# Patient Record
Sex: Female | Born: 1980 | Race: White | Hispanic: Yes | Marital: Married | Smoking: Former smoker
Health system: Southern US, Community
[De-identification: ages and names within clinical notes are randomized; demographics above are authoritative.]

## PROBLEM LIST (undated history)

## (undated) ENCOUNTER — Inpatient Hospital Stay (HOSPITAL_COMMUNITY): Payer: Self-pay

## (undated) DIAGNOSIS — S2232XA Fracture of one rib, left side, initial encounter for closed fracture: Secondary | ICD-10-CM

## (undated) DIAGNOSIS — G43909 Migraine, unspecified, not intractable, without status migrainosus: Secondary | ICD-10-CM

## (undated) DIAGNOSIS — A6000 Herpesviral infection of urogenital system, unspecified: Secondary | ICD-10-CM

## (undated) DIAGNOSIS — S62609A Fracture of unspecified phalanx of unspecified finger, initial encounter for closed fracture: Secondary | ICD-10-CM

## (undated) DIAGNOSIS — J45909 Unspecified asthma, uncomplicated: Secondary | ICD-10-CM

## (undated) DIAGNOSIS — E119 Type 2 diabetes mellitus without complications: Secondary | ICD-10-CM

---
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
## 2001-02-28 ENCOUNTER — Inpatient Hospital Stay (HOSPITAL_COMMUNITY): Admission: AD | Admit: 2001-02-28 | Discharge: 2001-02-28 | Payer: Self-pay | Admitting: *Deleted

## 2001-03-01 ENCOUNTER — Inpatient Hospital Stay (HOSPITAL_COMMUNITY): Admission: AD | Admit: 2001-03-01 | Discharge: 2001-03-03 | Payer: Self-pay | Admitting: Obstetrics

## 2001-05-28 ENCOUNTER — Emergency Department (HOSPITAL_COMMUNITY): Admission: EM | Admit: 2001-05-28 | Discharge: 2001-05-28 | Payer: Self-pay | Admitting: Emergency Medicine

## 2002-03-17 ENCOUNTER — Inpatient Hospital Stay (HOSPITAL_COMMUNITY): Admission: AD | Admit: 2002-03-17 | Discharge: 2002-03-17 | Payer: Self-pay | Admitting: *Deleted

## 2002-03-24 ENCOUNTER — Encounter: Payer: Self-pay | Admitting: *Deleted

## 2002-03-24 ENCOUNTER — Encounter (HOSPITAL_COMMUNITY): Admission: RE | Admit: 2002-03-24 | Discharge: 2002-04-23 | Payer: Self-pay | Admitting: *Deleted

## 2002-04-14 ENCOUNTER — Encounter: Admission: RE | Admit: 2002-04-14 | Discharge: 2002-04-14 | Payer: Self-pay | Admitting: *Deleted

## 2002-04-28 ENCOUNTER — Encounter (HOSPITAL_COMMUNITY): Admission: RE | Admit: 2002-04-28 | Discharge: 2002-05-09 | Payer: Self-pay | Admitting: *Deleted

## 2002-05-06 ENCOUNTER — Inpatient Hospital Stay (HOSPITAL_COMMUNITY): Admission: AD | Admit: 2002-05-06 | Discharge: 2002-05-09 | Payer: Self-pay | Admitting: Family Medicine

## 2002-06-13 ENCOUNTER — Encounter: Payer: Self-pay | Admitting: *Deleted

## 2002-06-13 ENCOUNTER — Emergency Department (HOSPITAL_COMMUNITY): Admission: EM | Admit: 2002-06-13 | Discharge: 2002-06-13 | Payer: Self-pay | Admitting: Emergency Medicine

## 2006-10-04 ENCOUNTER — Inpatient Hospital Stay (HOSPITAL_COMMUNITY): Admission: AD | Admit: 2006-10-04 | Discharge: 2006-10-04 | Payer: Self-pay | Admitting: Family Medicine

## 2006-11-22 ENCOUNTER — Inpatient Hospital Stay (HOSPITAL_COMMUNITY): Admission: AD | Admit: 2006-11-22 | Discharge: 2006-11-22 | Payer: Self-pay | Admitting: Obstetrics & Gynecology

## 2006-12-31 ENCOUNTER — Ambulatory Visit: Payer: Self-pay | Admitting: Advanced Practice Midwife

## 2006-12-31 ENCOUNTER — Inpatient Hospital Stay (HOSPITAL_COMMUNITY): Admission: AD | Admit: 2006-12-31 | Discharge: 2007-01-01 | Payer: Self-pay | Admitting: Gynecology

## 2007-01-28 ENCOUNTER — Ambulatory Visit (HOSPITAL_COMMUNITY): Admission: RE | Admit: 2007-01-28 | Discharge: 2007-01-28 | Payer: Self-pay | Admitting: Family Medicine

## 2007-04-12 ENCOUNTER — Ambulatory Visit: Payer: Self-pay | Admitting: Obstetrics and Gynecology

## 2007-04-12 ENCOUNTER — Inpatient Hospital Stay (HOSPITAL_COMMUNITY): Admission: AD | Admit: 2007-04-12 | Discharge: 2007-04-14 | Payer: Self-pay | Admitting: Obstetrics and Gynecology

## 2008-02-22 ENCOUNTER — Emergency Department (HOSPITAL_COMMUNITY): Admission: EM | Admit: 2008-02-22 | Discharge: 2008-02-22 | Payer: Self-pay | Admitting: Emergency Medicine

## 2009-05-26 ENCOUNTER — Encounter: Admission: RE | Admit: 2009-05-26 | Discharge: 2009-05-26 | Payer: Self-pay | Admitting: Internal Medicine

## 2009-06-21 ENCOUNTER — Emergency Department (HOSPITAL_COMMUNITY): Admission: EM | Admit: 2009-06-21 | Discharge: 2009-06-21 | Payer: Self-pay | Admitting: Emergency Medicine

## 2009-11-30 ENCOUNTER — Emergency Department (HOSPITAL_COMMUNITY): Admission: EM | Admit: 2009-11-30 | Discharge: 2009-11-30 | Payer: Self-pay | Admitting: Emergency Medicine

## 2010-04-08 ENCOUNTER — Emergency Department (HOSPITAL_COMMUNITY): Admission: EM | Admit: 2010-04-08 | Discharge: 2010-04-08 | Payer: Self-pay | Admitting: Emergency Medicine

## 2010-06-12 ENCOUNTER — Emergency Department (HOSPITAL_COMMUNITY)
Admission: EM | Admit: 2010-06-12 | Discharge: 2010-06-12 | Payer: Self-pay | Source: Home / Self Care | Admitting: Emergency Medicine

## 2010-09-04 LAB — RAPID STREP SCREEN (MED CTR MEBANE ONLY): Streptococcus, Group A Screen (Direct): POSITIVE — AB

## 2010-09-11 LAB — RAPID STREP SCREEN (MED CTR MEBANE ONLY): Streptococcus, Group A Screen (Direct): NEGATIVE

## 2011-04-04 LAB — CBC
MCHC: 33.1
Platelets: 293
RDW: 16.7 — ABNORMAL HIGH
WBC: 21.1 — ABNORMAL HIGH

## 2011-04-04 LAB — RPR: RPR Ser Ql: NONREACTIVE

## 2011-04-04 LAB — CCBB MATERNAL DONOR DRAW

## 2011-04-06 ENCOUNTER — Emergency Department (HOSPITAL_COMMUNITY)
Admission: EM | Admit: 2011-04-06 | Discharge: 2011-04-06 | Disposition: A | Discharge status: Home / Self Care | Payer: 59 | Attending: Emergency Medicine | Admitting: Emergency Medicine

## 2011-04-06 ENCOUNTER — Emergency Department (HOSPITAL_COMMUNITY): Payer: 59

## 2011-04-06 DIAGNOSIS — R071 Chest pain on breathing: Secondary | ICD-10-CM | POA: Insufficient documentation

## 2011-04-06 DIAGNOSIS — R05 Cough: Secondary | ICD-10-CM | POA: Insufficient documentation

## 2011-04-06 DIAGNOSIS — J45909 Unspecified asthma, uncomplicated: Secondary | ICD-10-CM | POA: Insufficient documentation

## 2011-04-06 DIAGNOSIS — R059 Cough, unspecified: Secondary | ICD-10-CM | POA: Insufficient documentation

## 2011-04-06 DIAGNOSIS — R55 Syncope and collapse: Secondary | ICD-10-CM | POA: Insufficient documentation

## 2011-04-06 LAB — POCT I-STAT, CHEM 8
Glucose, Bld: 141 mg/dL — ABNORMAL HIGH (ref 70–99)
HCT: 37 % (ref 36.0–46.0)
Hemoglobin: 12.6 g/dL (ref 12.0–15.0)
Potassium: 3.8 mEq/L (ref 3.5–5.1)
Sodium: 138 mEq/L (ref 135–145)

## 2011-04-06 LAB — POCT PREGNANCY, URINE: Preg Test, Ur: NEGATIVE

## 2011-04-10 LAB — WET PREP, GENITAL
Clue Cells Wet Prep HPF POC: NONE SEEN
Trich, Wet Prep: NONE SEEN

## 2011-04-10 LAB — URINE MICROSCOPIC-ADD ON

## 2011-04-10 LAB — URINALYSIS, ROUTINE W REFLEX MICROSCOPIC
Bilirubin Urine: NEGATIVE
Hgb urine dipstick: NEGATIVE
Specific Gravity, Urine: >1.03 — ABNORMAL HIGH
Urobilinogen, UA: 0.2

## 2012-09-24 ENCOUNTER — Encounter: Payer: 59 | Admitting: Medical

## 2012-11-18 ENCOUNTER — Encounter (HOSPITAL_COMMUNITY): Payer: Self-pay | Admitting: Emergency Medicine

## 2012-11-18 ENCOUNTER — Emergency Department (HOSPITAL_COMMUNITY)
Admission: EM | Admit: 2012-11-18 | Discharge: 2012-11-19 | Disposition: A | Discharge status: Home / Self Care | Payer: Self-pay | Attending: Emergency Medicine | Admitting: Emergency Medicine

## 2012-11-18 ENCOUNTER — Emergency Department (HOSPITAL_COMMUNITY): Payer: Self-pay

## 2012-11-18 DIAGNOSIS — Z3490 Encounter for supervision of normal pregnancy, unspecified, unspecified trimester: Secondary | ICD-10-CM

## 2012-11-18 DIAGNOSIS — J45909 Unspecified asthma, uncomplicated: Secondary | ICD-10-CM | POA: Insufficient documentation

## 2012-11-18 DIAGNOSIS — R1031 Right lower quadrant pain: Secondary | ICD-10-CM | POA: Insufficient documentation

## 2012-11-18 DIAGNOSIS — R109 Unspecified abdominal pain: Secondary | ICD-10-CM

## 2012-11-18 DIAGNOSIS — O9989 Other specified diseases and conditions complicating pregnancy, childbirth and the puerperium: Secondary | ICD-10-CM | POA: Insufficient documentation

## 2012-11-18 HISTORY — DX: Unspecified asthma, uncomplicated: J45.909

## 2012-11-18 LAB — URINALYSIS, ROUTINE W REFLEX MICROSCOPIC
Bilirubin Urine: NEGATIVE
Hgb urine dipstick: NEGATIVE
Protein, ur: NEGATIVE mg/dL
Urobilinogen, UA: 0.2 mg/dL (ref 0.0–1.0)

## 2012-11-18 LAB — COMPREHENSIVE METABOLIC PANEL
ALT: 16 U/L (ref 0–35)
AST: 12 U/L (ref 0–37)
Alkaline Phosphatase: 95 U/L (ref 39–117)
CO2: 25 mEq/L (ref 19–32)
Chloride: 101 mEq/L (ref 96–112)
GFR calc non Af Amer: >90 mL/min (ref >90)
Potassium: 4.1 mEq/L (ref 3.5–5.1)
Sodium: 135 mEq/L (ref 135–145)
Total Bilirubin: 0.1 mg/dL — ABNORMAL LOW (ref 0.3–1.2)

## 2012-11-18 LAB — CBC WITH DIFFERENTIAL/PLATELET
Basophils Absolute: 0 10*3/uL (ref 0.0–0.1)
HCT: 33.5 % — ABNORMAL LOW (ref 36.0–46.0)
Lymphocytes Relative: 26 % (ref 12–46)
Monocytes Absolute: 0.6 10*3/uL (ref 0.1–1.0)
Neutro Abs: 7.9 10*3/uL — ABNORMAL HIGH (ref 1.7–7.7)
Platelets: 277 10*3/uL (ref 150–400)
RDW: 14.6 % (ref 11.5–15.5)
WBC: 11.7 10*3/uL — ABNORMAL HIGH (ref 4.0–10.5)

## 2012-11-18 MED ORDER — SODIUM CHLORIDE 0.9 % IV SOLN
INTRAVENOUS | Status: DC
  Administered 2012-11-19: 01:00:00 via INTRAVENOUS

## 2012-11-18 MED ORDER — SODIUM CHLORIDE 0.9 % IV BOLUS (SEPSIS)
500.0000 mL | Freq: Once | INTRAVENOUS | Status: AC
  Administered 2012-11-19: 01:00:00 via INTRAVENOUS

## 2012-11-18 MED ORDER — ONDANSETRON 4 MG PO TBDP
8.0000 mg | ORAL_TABLET | Freq: Once | ORAL | Status: AC
  Administered 2012-11-18: 8 mg via ORAL

## 2012-11-18 MED ORDER — FENTANYL CITRATE 0.05 MG/ML IJ SOLN
100.0000 ug | Freq: Once | INTRAMUSCULAR | Status: AC
  Administered 2012-11-19: 100 ug via INTRAVENOUS
  Filled 2012-11-18: qty 2

## 2012-11-18 MED ORDER — ONDANSETRON 4 MG PO TBDP
ORAL_TABLET | ORAL | Status: AC
  Filled 2012-11-18: qty 2

## 2012-11-18 MED ORDER — OXYCODONE-ACETAMINOPHEN 5-325 MG PO TABS
1.0000 | ORAL_TABLET | Freq: Once | ORAL | Status: AC
  Administered 2012-11-18: 1 via ORAL
  Filled 2012-11-18: qty 1

## 2012-11-18 NOTE — ED Provider Notes (Signed)
History     CSN: [id number]  Arrival date & time 11/18/12  1842   First MD Initiated Contact with Patient 11/18/12 2305      Chief Complaint  Patient presents with  . Flank Pain    (Consider location/radiation/quality/duration/timing/severity/associated sxs/prior treatment) HPI Comments: Wanda Harris is a 32 y.o. Female  who presents for evaluation of right lower abdomen pain. The pain started about 20 hours ago and is persistent. The pain radiates to the right flank. The pain was gradual in onset. The pain is dull. The pain is worse with deep breathing. Denies fever, or chills. She has nausea without vomiting. That has been present for several days. Last menstrual period was 4/13/4 evening. She has a positive home pregnancy test. She is gravida 4 para 3. She denies vaginal bleeding or discharge. She's never had this previously. There's been no associated dysuria or urinary frequency, fever, chills, cough, shortness of breath, or chest pain. There are no other known modifying factors.  Patient is a 32 y.o. female presenting with flank pain. The history is provided by the patient.  Flank Pain    Past Medical History  Diagnosis Date  . Asthma     does not use inhaler    History reviewed. No pertinent past surgical history.  History reviewed. No pertinent family history.  History  Substance Use Topics  . Smoking status: Never Smoker   . Smokeless tobacco: Not on file  . Alcohol Use: No    OB History   Grav Para Term Preterm Abortions TAB SAB Ect Mult Living   1               Review of Systems  Genitourinary: Positive for flank pain.  All other systems reviewed and are negative.    Allergies  Review of patient's allergies indicates no known allergies.  Home Medications   Current Outpatient Rx  Name  Route  Sig  Dispense  Refill  . aspirin-acetaminophen-caffeine (PAMPRIN MAX) 250-250-65 MG per tablet   Oral   Take 1 tablet by mouth every 6 (six) hours as needed  for pain.           BP 109/56  Pulse 67  Temp(Src) 98.8 F (37.1 C) (Oral)  Resp 14  Wt 180 lb (81.647 kg)  SpO2 98%  LMP 10/05/2012  Physical Exam  Nursing note and vitals reviewed. Constitutional: She is oriented to person, place, and time. She appears well-developed and well-nourished.  HENT:  Head: Normocephalic and atraumatic.  Eyes: Conjunctivae and EOM are normal. Pupils are equal, round, and reactive to light.  Neck: Normal range of motion and phonation normal. Neck supple.  Cardiovascular: Normal rate, regular rhythm and intact distal pulses.   Pulmonary/Chest: Effort normal and breath sounds normal. She exhibits no tenderness.  Abdominal: Soft. She exhibits no distension and no mass. There is tenderness (Moderate right lower quadrant tenderness). There is no rebound and no guarding.  Genitourinary:  Unable to perform percussive tenderness. Testing of the right flank secondary to right flank tenderness to light palpation.   Normal external female genitalia. Small amount of cleaning vaginal discharge. Cervix appears normal. External os is open to fingertip. There is no blood in the vagina. On bimanual examination the uterus is not enlarged. There is bilateral adnexal tenderness, without palpable mass or enlarged ovary.  Musculoskeletal: Normal range of motion.  Tender to palpation right flank and right low back  Neurological: She is alert and oriented to person, place, and  time. She has normal strength. She exhibits normal muscle tone.  Skin: Skin is warm and dry.  Psychiatric: She has a normal mood and affect. Her behavior is normal. Judgment and thought content normal.    ED Course  Procedures (including critical care time)  Medications  0.9 %  sodium chloride infusion ( Intravenous Stopped 11/19/12 0230)  ondansetron (ZOFRAN-ODT) disintegrating tablet 8 mg (8 mg Oral Given 11/18/12 1912)  oxyCODONE-acetaminophen (PERCOCET/ROXICET) 5-325 MG per tablet 1 tablet (1  tablet Oral Given 11/18/12 1911)  sodium chloride 0.9 % bolus 500 mL (0 mLs Intravenous Stopped 11/19/12 0230)  fentaNYL (SUBLIMAZE) injection 100 mcg (100 mcg Intravenous Given 11/19/12 0100)    Patient Vitals for the past 24 hrs:  BP Temp Temp src Pulse Resp SpO2 Weight  11/19/12 0241 109/56 mmHg - - - - - -  11/19/12 0211 92/59 mmHg - - 67 14 98 % -  11/18/12 2049 106/49 mmHg 98.8 F (37.1 C) Oral 64 14 99 % -  11/18/12 1857 133/62 mmHg 98.2 F (36.8 C) Oral 71 - 98 % 180 lb (81.647 kg)   1:09 AM Reevaluation with update and discussion. After initial assessment and treatment, an updated evaluation reveals she continues to have crampy mid abdominal pain. Krissia Schreier L    3:22 AM Reevaluation with update and discussion. After initial assessment and treatment, an updated evaluation reveals Her pain is almost gone. Ambulatory BP normal.. Nerea Bordenave L     Labs Reviewed  WET PREP, GENITAL - Abnormal; Notable for the following:    Yeast Wet Prep HPF POC FEW (*)    Clue Cells Wet Prep HPF POC FEW (*)    WBC, Wet Prep HPF POC TOO NUMEROUS TO COUNT (*)    All other components within normal limits  CBC WITH DIFFERENTIAL - Abnormal; Notable for the following:    WBC 11.7 (*)    Hemoglobin 11.1 (*)    HCT 33.5 (*)    MCV 75.6 (*)    MCH 25.1 (*)    Neutro Abs 7.9 (*)    All other components within normal limits  COMPREHENSIVE METABOLIC PANEL - Abnormal; Notable for the following:    Glucose, Bld 146 (*)    Total Bilirubin 0.1 (*)    All other components within normal limits  URINALYSIS, ROUTINE W REFLEX MICROSCOPIC - Abnormal; Notable for the following:    Glucose, UA 250 (*)    All other components within normal limits  POCT PREGNANCY, URINE - Abnormal; Notable for the following:    Preg Test, Ur POSITIVE (*)    All other components within normal limits  GC/CHLAMYDIA PROBE AMP   Mr Pelvis Wo Contrast  11/19/2012   *RADIOLOGY REPORT*  Clinical Data:  [redacted] weeks pregnant.  Right  flank pain and right lower quadrant pain.  MRI ABDOMEN AND PELVIS WITHOUT CONTRAST  Technique:  Multiplanar multisequence MR imaging of the abdomen and pelvis was performed.  No intravenous contrast was administered.  Comparison:   None.  MRI ABDOMEN  Findings:  The gallbladder is normal.  No evidence of hydronephrosis.  No focal hepatic lesion.  The spleen, stomach and proximal small bowel are normal.  IMPRESSION: No evidence of cholecystitis or hydronephrosis.  MRI PELVIS  Findings: The appendix is partially imaged medial to the cecum (image 32, series 3).  There is no associated inflammation.  No evidence of hydroureter on the left or right.  The ovaries are normal.  Gravid uterus is noted.  Bladder is  normal.  There is trace amount fluid adjacent the right adnexa.  No significant free fluid in the cul-de-sac.  IMPRESSION:  1.  No evidence of acute appendicitis.  The appendix is partially imaged and appears normal.  Recommend close interval follow-up.  2.  No evidence of hydroureter on the left or right.  3.  Ovaries appear normal. Small amount free fluid adjacent to the right adnexa.   4.  Gravid uterus.   Original Report Authenticated By: Genevive Bi, M.D.   Mr Abdomen Wo Contrast  11/19/2012   *RADIOLOGY REPORT*  Clinical Data:  [redacted] weeks pregnant.  Right flank pain and right lower quadrant pain.  MRI ABDOMEN AND PELVIS WITHOUT CONTRAST  Technique:  Multiplanar multisequence MR imaging of the abdomen and pelvis was performed.  No intravenous contrast was administered.  Comparison:   None.  MRI ABDOMEN  Findings:  The gallbladder is normal.  No evidence of hydronephrosis.  No focal hepatic lesion.  The spleen, stomach and proximal small bowel are normal.  IMPRESSION: No evidence of cholecystitis or hydronephrosis.  MRI PELVIS  Findings: The appendix is partially imaged medial to the cecum (image 32, series 3).  There is no associated inflammation.  No evidence of hydroureter on the left or right.  The  ovaries are normal.  Gravid uterus is noted.  Bladder is normal.  There is trace amount fluid adjacent the right adnexa.  No significant free fluid in the cul-de-sac.  IMPRESSION:  1.  No evidence of acute appendicitis.  The appendix is partially imaged and appears normal.  Recommend close interval follow-up.  2.  No evidence of hydroureter on the left or right.  3.  Ovaries appear normal. Small amount free fluid adjacent to the right adnexa.   4.  Gravid uterus.   Original Report Authenticated By: Genevive Bi, M.D.     1. Abdominal pain   2. Pregnancy       MDM  Nonspecific abdominal pain, with reassuring evaluation, including pelvic exam and MR, abdomen. Doubt appendicitis, colitis, ovarian, abnormality or pregnancy complication. Doubt metabolic instability, serious bacterial infection or impending vascular collapse; the patient is stable for discharge.  Nursing Notes Reviewed/ Care Coordinated, and agree without changes. Applicable Imaging Reviewed.  Interpretation of Laboratory Data incorporated into ED treatment   Plan: Home Medications- Norco; Home Treatments- rest, fluids; Recommended follow up- OB followup ASAP and [facility] follow up when necessary       Flint Melter, MD 11/19/12 [phone]

## 2012-11-18 NOTE — ED Notes (Signed)
Right sided flank since yesterday no known cause. Sharp. Radiating from front lower flank to back. Recent POSITIVE pregnancy test. NO urinary Sx. Normal BM. Nausea, no emesis

## 2012-11-18 NOTE — ED Notes (Signed)
NURSE FIRST ROUNDS : NURSE EXPLAINED DELAY , WAIT TIME AND PROCESS , RESTING AT WAITING AREA WITH NO DISTRESS, RESPIRATIONS UNLABORED .

## 2012-11-19 LAB — WET PREP, GENITAL

## 2012-11-19 MED ORDER — HYDROCODONE-ACETAMINOPHEN 5-325 MG PO TABS: 1.0000 | ORAL_TABLET | ORAL | Status: DC | PRN

## 2012-11-19 NOTE — ED Notes (Signed)
Pt. C/o lower abdominal cramping.

## 2012-11-19 NOTE — ED Notes (Signed)
Pt. Verbalized understanding of discharge paperwork. 

## 2012-11-19 NOTE — ED Notes (Signed)
Dr. Effie Shy updated on patient VS

## 2012-11-19 NOTE — ED Notes (Signed)
Physician seen pt prior to this nurse. See physician notes for assessment.

## 2012-12-25 ENCOUNTER — Encounter (HOSPITAL_COMMUNITY): Payer: Self-pay | Admitting: *Deleted

## 2012-12-25 ENCOUNTER — Inpatient Hospital Stay (HOSPITAL_COMMUNITY)
Admission: AD | Admit: 2012-12-25 | Discharge: 2012-12-26 | Disposition: A | Discharge status: Home / Self Care | Payer: Medicaid Other | Source: Ambulatory Visit | Attending: Obstetrics & Gynecology | Admitting: Obstetrics & Gynecology

## 2012-12-25 ENCOUNTER — Inpatient Hospital Stay (HOSPITAL_COMMUNITY): Payer: Medicaid Other

## 2012-12-25 DIAGNOSIS — N930 Postcoital and contact bleeding: Secondary | ICD-10-CM

## 2012-12-25 DIAGNOSIS — B9689 Other specified bacterial agents as the cause of diseases classified elsewhere: Secondary | ICD-10-CM | POA: Insufficient documentation

## 2012-12-25 DIAGNOSIS — O2390 Unspecified genitourinary tract infection in pregnancy, unspecified trimester: Secondary | ICD-10-CM | POA: Insufficient documentation

## 2012-12-25 DIAGNOSIS — O26859 Spotting complicating pregnancy, unspecified trimester: Secondary | ICD-10-CM | POA: Insufficient documentation

## 2012-12-25 DIAGNOSIS — N760 Acute vaginitis: Secondary | ICD-10-CM | POA: Insufficient documentation

## 2012-12-25 DIAGNOSIS — Z3490 Encounter for supervision of normal pregnancy, unspecified, unspecified trimester: Secondary | ICD-10-CM

## 2012-12-25 DIAGNOSIS — A499 Bacterial infection, unspecified: Secondary | ICD-10-CM | POA: Insufficient documentation

## 2012-12-25 LAB — URINALYSIS, ROUTINE W REFLEX MICROSCOPIC
Bilirubin Urine: NEGATIVE
Leukocytes, UA: NEGATIVE
Nitrite: NEGATIVE
Specific Gravity, Urine: 1.015 (ref 1.005–1.030)
Urobilinogen, UA: 0.2 mg/dL (ref 0.0–1.0)

## 2012-12-25 LAB — URINE MICROSCOPIC-ADD ON

## 2012-12-25 NOTE — MAU Provider Note (Signed)
History     CSN: [id number]  Arrival date and time: 12/25/12 2000   First Provider Initiated Contact with Patient 12/25/12 2127      Chief Complaint  Patient presents with  . Vaginal Bleeding  . Abdominal Pain   HPI This is a 32 y.o. female at [redacted]w[redacted]d who presents with c/o small area of spotting after intercourse.  Has had pain over left lower quadrant. Was seen for that pain also in May.  Has not started [facility] yet but has an appt in our clinic in July.   RN Note: Pt reports blood on tissue yesterday and today has a little more bleeding. Pain left lower quadrant since yesterday      OB History   Grav Para Term Preterm Abortions TAB SAB Ect Mult Living   5 3 3  1 1    3       Past Medical History  Diagnosis Date  . Asthma     does not use inhaler    Past Surgical History  Procedure Laterality Date  . No past surgeries      History reviewed. No pertinent family history.  History  Substance Use Topics  . Smoking status: Never Smoker   . Smokeless tobacco: Not on file  . Alcohol Use: No    Allergies: No Known Allergies  No prescriptions prior to admission    Review of Systems  Constitutional: Negative for fever, chills and malaise/fatigue.  Gastrointestinal: Positive for nausea, abdominal pain and constipation. Negative for vomiting and diarrhea.  Genitourinary: Negative for dysuria.  Neurological: Negative for dizziness.   Physical Exam   Blood pressure 107/63, pulse 77, temperature 98.8 F (37.1 C), temperature source Oral, resp. rate 18, height 5' (1.524 m), weight 81.647 kg (180 lb), last menstrual period 10/05/2012, SpO2 100.00%.  Physical Exam  Constitutional: She is oriented to person, place, and time. She appears well-developed and well-nourished. No distress.  HENT:  Head: Normocephalic.  Cardiovascular: Normal rate.   Respiratory: Effort normal.  GI: Soft. She exhibits no distension and no mass. There is tenderness (over lower abdomen to light  touch). There is no rebound and no guarding.  Genitourinary: No vaginal discharge found.  Musculoskeletal: Normal range of motion.  Neurological: She is alert and oriented to person, place, and time.  Skin: Skin is warm and dry.  Psychiatric: She has a normal mood and affect.    MAU Course  Procedures  MDM [place] ordered:  US Ob Comp Less 14 Wks  12/26/2012   *RADIOLOGY REPORT*  Clinical Data: Severe pelvic pain.  Unable to assess fetal heart tones.  OBSTETRIC <14 WK ULTRASOUND  Technique:  Transabdominal ultrasound was performed for evaluation of the gestation as well as the maternal uterus and adnexal regions.  Comparison:  None.  Intrauterine gestational sac: Single Yolk sac: Yolk sac not identified. Embryo: Present. Cardiac Activity: Present. Heart Rate: 167 bpm  CRL:  54.6 mm  12 w  1 d            [place] EDC: 07/08/2013.  Maternal uterus/Adnexae: Physiologic appearance of the ovaries.  Right corpus luteum cyst. No free fluid.  IMPRESSION: Single intrauterine pregnancy without complicating features.   Original Report Authenticated By: Andreas Newport, M.D.    Assessment and Plan  A:  Pregnancy      Mild BV on previous exam, not treated      Postcoital bleeding       Normal IUP  P:  Results reviewed  Discussed mild BV diagnosed on ER visit, will treat now       Keep appt in clinic later July       Since we have good dates now, will sent note to clinic to get her to MFM next week so we can get first trimester NT/testing done before 13 weeks.    [facility] 12/25/2012, 9:31 PM

## 2012-12-25 NOTE — MAU Note (Signed)
Pt reports blood on tissue yesterday and today has a little more bleeding. Pain left lower quadrant since yesterday

## 2012-12-26 DIAGNOSIS — A499 Bacterial infection, unspecified: Secondary | ICD-10-CM

## 2012-12-26 DIAGNOSIS — N760 Acute vaginitis: Secondary | ICD-10-CM

## 2012-12-26 MED ORDER — TRAMADOL HCL 50 MG PO TABS
50.0000 mg | ORAL_TABLET | Freq: Once | ORAL | Status: AC
  Administered 2012-12-26: 50 mg via ORAL
  Filled 2012-12-26: qty 1

## 2012-12-26 MED ORDER — TRAMADOL HCL 50 MG PO TABS: 50.0000 mg | ORAL_TABLET | Freq: Once | ORAL | Status: DC

## 2012-12-29 ENCOUNTER — Telehealth: Payer: Self-pay | Admitting: General Practice

## 2012-12-29 ENCOUNTER — Other Ambulatory Visit: Payer: Self-pay | Admitting: General Practice

## 2012-12-29 DIAGNOSIS — Z3682 Encounter for antenatal screening for nuchal translucency: Secondary | ICD-10-CM

## 2012-12-29 DIAGNOSIS — Z3490 Encounter for supervision of normal pregnancy, unspecified, unspecified trimester: Secondary | ICD-10-CM

## 2012-12-29 NOTE — Telephone Encounter (Signed)
Called patient, no answer and unable to leave message- MFM appt for 7/11 at 11:30, patient needs to be informed

## 2012-12-29 NOTE — Telephone Encounter (Signed)
Message copied by Kathee Delton on Mon Dec 29, 2012  3:55 PM ------      Message from: Aviva Signs      Created: Fri Dec 26, 2012 12:19 AM      Regarding: need to schedule first screen asap       Seen for bleeding in MAU            US showed 12.1weeks on 12/25/12            Wants first screen with MFM (blood and NT)            Need to do early next week before she hits 13 weeks.            Thanks      Hilda Lias ------

## 2012-12-31 NOTE — Telephone Encounter (Signed)
Patient called and left message stating she has an appt with MFM Friday at 11:30 and she wanted to see if theres anything she needs to do for that appointment like coming earlier and if she is able to eat or not and would like a call back.

## 2012-12-31 NOTE — Telephone Encounter (Signed)
Called patient, no answer- left message on VM stating she did not need to prepare for her appointment in any way and to just come in and if she has any other questions or concerns to call us back at the clinics

## 2013-01-02 ENCOUNTER — Ambulatory Visit (HOSPITAL_COMMUNITY)
Admission: RE | Admit: 2013-01-02 | Discharge: 2013-01-02 | Disposition: A | Discharge status: Home / Self Care | Payer: Medicaid Other | Source: Ambulatory Visit | Attending: Obstetrics and Gynecology | Admitting: Obstetrics and Gynecology

## 2013-01-02 ENCOUNTER — Encounter (HOSPITAL_COMMUNITY): Payer: Self-pay

## 2013-01-02 ENCOUNTER — Other Ambulatory Visit: Payer: Self-pay

## 2013-01-02 DIAGNOSIS — O351XX0 Maternal care for (suspected) chromosomal abnormality in fetus, not applicable or unspecified: Secondary | ICD-10-CM | POA: Insufficient documentation

## 2013-01-02 DIAGNOSIS — Z3682 Encounter for antenatal screening for nuchal translucency: Secondary | ICD-10-CM

## 2013-01-02 DIAGNOSIS — Z3689 Encounter for other specified antenatal screening: Secondary | ICD-10-CM | POA: Insufficient documentation

## 2013-01-02 DIAGNOSIS — O3510X0 Maternal care for (suspected) chromosomal abnormality in fetus, unspecified, not applicable or unspecified: Secondary | ICD-10-CM | POA: Insufficient documentation

## 2013-01-13 ENCOUNTER — Ambulatory Visit (INDEPENDENT_AMBULATORY_CARE_PROVIDER_SITE_OTHER): Payer: Medicaid Other

## 2013-01-13 VITALS — BP 106/66 | Temp 97.9°F | Wt 179.2 lb

## 2013-01-13 DIAGNOSIS — Z3480 Encounter for supervision of other normal pregnancy, unspecified trimester: Secondary | ICD-10-CM

## 2013-01-13 DIAGNOSIS — Z3492 Encounter for supervision of normal pregnancy, unspecified, second trimester: Secondary | ICD-10-CM

## 2013-01-13 DIAGNOSIS — N898 Other specified noninflammatory disorders of vagina: Secondary | ICD-10-CM

## 2013-01-13 LAB — POCT URINALYSIS DIP (DEVICE)
Glucose, UA: 500 mg/dL — AB
Hgb urine dipstick: NEGATIVE
Specific Gravity, Urine: 1.015 (ref 1.005–1.030)
Urobilinogen, UA: 0.2 mg/dL (ref 0.0–1.0)
pH: 7 (ref 5.0–8.0)

## 2013-01-13 LAB — OB RESULTS CONSOLE GBS: GBS: POSITIVE

## 2013-01-13 MED ORDER — FLUCONAZOLE 150 MG PO TABS: 150.0000 mg | ORAL_TABLET | Freq: Once | ORAL | Status: DC

## 2013-01-13 MED ORDER — METRONIDAZOLE 500 MG PO TABS: 500.0000 mg | ORAL_TABLET | Freq: Two times a day (BID) | ORAL | Status: DC

## 2013-01-13 NOTE — Progress Notes (Signed)
Pulse- 87  Pressure-lower abd  Vaginal discharge-yellowish with odor Pt c/o headaches Weight gain of 11-20lbs New ob packet given Called Thressa Sheller, CNM and per provider she stated to treat pt for BV and yeast based on her results on 11/19/12 and chart shows pt was not treated.  On pt prenatal appt we will evaluate discharge.  Informed pt of recommendation and pt stated understanding.

## 2013-01-14 LAB — OBSTETRIC PANEL
Eosinophils Absolute: 0.1 10*3/uL (ref 0.0–0.7)
Hemoglobin: 10.5 g/dL — ABNORMAL LOW (ref 12.0–15.0)
Hepatitis B Surface Ag: NEGATIVE
Lymphs Abs: 1.8 10*3/uL (ref 0.7–4.0)
MCH: 24.2 pg — ABNORMAL LOW (ref 26.0–34.0)
Monocytes Relative: 5 % (ref 3–12)
Neutro Abs: 6.9 10*3/uL (ref 1.7–7.7)
Neutrophils Relative %: 75 % (ref 43–77)
RBC: 4.34 MIL/uL (ref 3.87–5.11)
Rh Type: POSITIVE

## 2013-01-15 ENCOUNTER — Encounter: Payer: Self-pay | Admitting: *Deleted

## 2013-01-15 DIAGNOSIS — Z3480 Encounter for supervision of other normal pregnancy, unspecified trimester: Secondary | ICD-10-CM | POA: Insufficient documentation

## 2013-01-15 LAB — HEMOGLOBINOPATHY EVALUATION: Hemoglobin Other: 0 %

## 2013-01-16 ENCOUNTER — Encounter: Payer: Self-pay | Admitting: Obstetrics and Gynecology

## 2013-01-16 ENCOUNTER — Other Ambulatory Visit (HOSPITAL_COMMUNITY)
Admission: RE | Admit: 2013-01-16 | Discharge: 2013-01-16 | Disposition: A | Discharge status: Home / Self Care | Payer: Medicaid Other | Source: Ambulatory Visit | Attending: Obstetrics and Gynecology | Admitting: Obstetrics and Gynecology

## 2013-01-16 ENCOUNTER — Ambulatory Visit (INDEPENDENT_AMBULATORY_CARE_PROVIDER_SITE_OTHER): Payer: Medicaid Other | Admitting: Obstetrics and Gynecology

## 2013-01-16 VITALS — BP 97/64 | Temp 98.1°F | Wt 178.0 lb

## 2013-01-16 DIAGNOSIS — Z3482 Encounter for supervision of other normal pregnancy, second trimester: Secondary | ICD-10-CM

## 2013-01-16 DIAGNOSIS — O24919 Unspecified diabetes mellitus in pregnancy, unspecified trimester: Secondary | ICD-10-CM

## 2013-01-16 DIAGNOSIS — Z01419 Encounter for gynecological examination (general) (routine) without abnormal findings: Secondary | ICD-10-CM | POA: Insufficient documentation

## 2013-01-16 DIAGNOSIS — Z1151 Encounter for screening for human papillomavirus (HPV): Secondary | ICD-10-CM | POA: Insufficient documentation

## 2013-01-16 DIAGNOSIS — O24112 Pre-existing diabetes mellitus, type 2, in pregnancy, second trimester: Secondary | ICD-10-CM

## 2013-01-16 DIAGNOSIS — O209 Hemorrhage in early pregnancy, unspecified: Secondary | ICD-10-CM

## 2013-01-16 DIAGNOSIS — O99810 Abnormal glucose complicating pregnancy: Secondary | ICD-10-CM | POA: Insufficient documentation

## 2013-01-16 LAB — POCT URINALYSIS DIP (DEVICE)
Bilirubin Urine: NEGATIVE
Glucose, UA: 250 mg/dL — AB
Ketones, ur: NEGATIVE mg/dL
Specific Gravity, Urine: >=1.03 (ref 1.005–1.030)

## 2013-01-16 LAB — CULTURE, OB URINE: Colony Count: >=100000

## 2013-01-16 MED ORDER — CONCEPT OB 130-92.4-1 MG PO CAPS: 1.0000 | ORAL_CAPSULE | Freq: Every day | ORAL | Status: DC

## 2013-01-16 NOTE — Progress Notes (Signed)
Pulse 74 Vaginal d/c stated as yellow with odor.

## 2013-01-16 NOTE — Progress Notes (Signed)
   Subjective:    Wanda Harris is a [name] [redacted]w[redacted]d being seen today for her first obstetrical visit.  Her obstetrical history is significant for A1 GDM with P2 only, largest BW 8#5, FH DM (sis, Mo), spotting at 12 wks, son has hypothyoidism. . Patient does intend to breast feed. Pregnancy history fully reviewed. Seen MAU for 12 wk spotting and had normal [place]. Treated for BV.  Patient reports no complaints.  Filed Vitals:   01/16/13 0817  BP: 97/64  Temp: 98.1 F (36.7 C)  Weight: 178 lb (80.74 kg)    HISTORY: OB History   Grav Para Term Preterm Abortions TAB SAB Ect Mult Living   5 3 3  1 1    3      # Outc Date GA Lbr Len/2nd Wgt Sex Del Anes PTL Lv   1 TRM 9/02 [redacted]w[redacted]d  7lb2oz(3.232kg) F SVD EPI  Yes   2 TRM 11/03 [redacted]w[redacted]d  8lb5oz(3.771kg) M SVD EPI  Yes   3 TAB 2007           4 TRM 10/08 [redacted]w[redacted]d  7lb5oz(3.317kg) F SVD EPI  Yes   5 CUR              Past Medical History  Diagnosis Date  . Asthma     does not use inhaler   Past Surgical History  Procedure Laterality Date  . No past surgeries     Family History  Problem Relation Age of Onset  . Diabetes Mother   . Hypertension Mother   . Arthritis Mother   . Hyperlipidemia Father      Exam    Uterus:   15 wk size. DT 150  Pelvic Exam: Done by Dr, Thad Ranger   Perineum: Normal Perineum   Vulva: normal   Vagina:  normal mucosa, normal discharge       Cervix: bleeding with Pap. ectropion       Bony Pelvis: proven to 8#5  System: Breast:  normal appearance, no masses or tenderness   Skin: normal coloration and turgor, no rashes    Neurologic: oriented, normal, grossly non-focal   Extremities: normal strength, tone, and muscle mass   HEENT PERRLA   Mouth/Teeth mucous membranes moist, pharynx normal without lesions and dental hygiene good   Neck supple and no masses   Cardiovascular: regular rate and rhythm, no murmurs or gallops   Respiratory:  appears well, vitals normal, no respiratory distress, acyanotic, normal RR,  ear and throat exam is normal, neck free of mass or lymphadenopathy, chest clear, no wheezing, crepitations, rhonchi, normal symmetric air entry   Abdomen: soft, non-tender; bowel sounds normal; no masses,  no organomegaly   Urinary: urethral meatus normal   POCT gluc 124   Assessment:    Pregnancy: [name] Patient Active Problem List   Diagnosis Date Noted  . Class B gestational diabetes mellitus 01/16/2013  . First trimester bleeding 01/16/2013  . Supervision of other normal pregnancy 01/15/2013  Early 1 hr 205 c/w pregestational DM      Plan:     Initial labs drawn already and reviewed with pt. Will get hgb A1C, CMP, TSH, 24 h urine Schedule fetal echo and opthal consult Prenatal vitamins.Borderline anemia (hgb 10.5)> start vits Problem list reviewed and updated. Genetic Screening discussed Quad Screen: ordered.  Ultrasound discussed; fetal survey: ordered.  Follow up in 1 weeks. 50% of 30 min visit spent on counseling and coordination of care.     Sanda Dejoy 01/16/2013

## 2013-01-16 NOTE — Patient Instructions (Signed)

## 2013-01-19 ENCOUNTER — Ambulatory Visit (INDEPENDENT_AMBULATORY_CARE_PROVIDER_SITE_OTHER): Payer: Medicaid Other | Admitting: Obstetrics & Gynecology

## 2013-01-19 DIAGNOSIS — O24112 Pre-existing diabetes mellitus, type 2, in pregnancy, second trimester: Secondary | ICD-10-CM

## 2013-01-19 DIAGNOSIS — O24919 Unspecified diabetes mellitus in pregnancy, unspecified trimester: Secondary | ICD-10-CM

## 2013-01-19 DIAGNOSIS — Z3482 Encounter for supervision of other normal pregnancy, second trimester: Secondary | ICD-10-CM

## 2013-01-19 DIAGNOSIS — O209 Hemorrhage in early pregnancy, unspecified: Secondary | ICD-10-CM

## 2013-01-19 LAB — HEMOGLOBIN A1C
Hgb A1c MFr Bld: 6.5 % — ABNORMAL HIGH (ref <5.7)
Mean Plasma Glucose: 140 mg/dL — ABNORMAL HIGH (ref <117)

## 2013-01-19 LAB — CREATININE CLEARANCE, URINE, 24 HOUR
Creatinine, 24H Ur: 1038 mg/d (ref 700–1800)
Creatinine, Urine: 69.2 mg/dL

## 2013-01-19 LAB — COMPREHENSIVE METABOLIC PANEL
AST: 12 U/L (ref 0–37)
Alkaline Phosphatase: 64 U/L (ref 39–117)
BUN: 8 mg/dL (ref 6–23)
Glucose, Bld: 128 mg/dL — ABNORMAL HIGH (ref 70–99)
Sodium: 137 mEq/L (ref 135–145)
Total Bilirubin: 0.3 mg/dL (ref 0.3–1.2)
Total Protein: 6.6 g/dL (ref 6.0–8.3)

## 2013-01-19 LAB — PROTEIN, URINE, 24 HOUR
Protein, 24H Urine: 150 mg/d — ABNORMAL HIGH (ref 50–100)
Protein, Urine: 10 mg/dL

## 2013-01-19 NOTE — Progress Notes (Signed)
G4P3 Pt here to see educator only re GDM. She had had GDM with her 2nd pregnancy, however 1st and 3rd pregnancies she states were normal. Pt very familiar with how to use a meter as she had done with her pervious GDM.  Demonstrated how to use the meter. Pt then returned demonstration (teach back) with primary issue with lancet device. Give further instruction.' Pt instructed to log her readings, given target glucose goals and review of meal plan. Thank you, Lenor Coffin, RN, Diabetes CNS [phone])

## 2013-01-19 NOTE — Progress Notes (Deleted)
Diabetes Treatment Program Recommendations  ADA Standards of Care 2012 Diabetes in Pregnancy Target Glucose Ranges:  Fasting: 60 - 90 mg/dL Preprandial: 60 - 161 mg/dL 1 hr postprandial: Less than 140mg /dL (from first bite of meal) 2 hr postprandial: Less than 120 mg/dL (from first bit of meal)  Pt referred to me per Dr Maggie Font regardinga need for medication regimen change and needs. Reviewed her glucose log (beautiful, by the way with all appropriate readings and times). Glucose only slightly elevated in the am.  Recommended to increase her glyburide to 7.5 mg at HS And continue Glyburide  2.5 mg in am .

## 2013-01-20 ENCOUNTER — Telehealth: Payer: Self-pay | Admitting: *Deleted

## 2013-01-20 ENCOUNTER — Encounter: Payer: Self-pay | Admitting: Obstetrics & Gynecology

## 2013-01-20 DIAGNOSIS — B951 Streptococcus, group B, as the cause of diseases classified elsewhere: Secondary | ICD-10-CM | POA: Insufficient documentation

## 2013-01-20 DIAGNOSIS — O2340 Unspecified infection of urinary tract in pregnancy, unspecified trimester: Secondary | ICD-10-CM | POA: Insufficient documentation

## 2013-01-20 MED ORDER — AMPICILLIN 500 MG PO CAPS: 500.0000 mg | ORAL_CAPSULE | Freq: Four times a day (QID) | ORAL | Status: DC

## 2013-01-20 NOTE — Telephone Encounter (Signed)
Called patient and left her a message to call us back.

## 2013-01-20 NOTE — Telephone Encounter (Signed)
Message copied by Mannie Stabile on Tue Jan 20, 2013 10:58 AM ------      Message from: Allie Bossier      Created: Tue Jan 20, 2013  8:28 AM       She has a GBS UTI. She will need a prescription for amp 500 mg po QID for 7 days.      Thanks ------

## 2013-01-22 ENCOUNTER — Telehealth: Payer: Self-pay

## 2013-01-22 NOTE — Telephone Encounter (Signed)
Pt informed of rx

## 2013-01-22 NOTE — Telephone Encounter (Addendum)
Returned pt's call and left message stating that we need to know the name of her meter. We will then send Rx for test strips and lancets. Please call back with this information to the nurse voice mail.   8/1  1030  Pt left message that her meter is the [employer] Nano. I sent the Rx for her refill of strips and lancets and called her to inform her. She voiced understanding.

## 2013-01-22 NOTE — Telephone Encounter (Signed)
Pt called and stated that she needs Rx for test strips sent to her [facility].  Pt stated that we can leave message on VM.

## 2013-01-23 MED ORDER — ACCU-CHEK FASTCLIX LANCETS MISC: 1.0000 | Freq: Four times a day (QID) | Status: DC

## 2013-01-23 MED ORDER — GLUCOSE BLOOD VI STRP: ORAL_STRIP | Status: DC

## 2013-01-26 ENCOUNTER — Encounter: Payer: Self-pay | Admitting: Family Medicine

## 2013-01-26 ENCOUNTER — Ambulatory Visit (INDEPENDENT_AMBULATORY_CARE_PROVIDER_SITE_OTHER): Payer: Medicaid Other | Admitting: Family Medicine

## 2013-01-26 ENCOUNTER — Encounter: Payer: Medicaid Other | Attending: Obstetrics & Gynecology | Admitting: *Deleted

## 2013-01-26 VITALS — BP 92/55 | Temp 98.7°F | Wt 179.9 lb

## 2013-01-26 DIAGNOSIS — O99810 Abnormal glucose complicating pregnancy: Secondary | ICD-10-CM

## 2013-01-26 DIAGNOSIS — O24419 Gestational diabetes mellitus in pregnancy, unspecified control: Secondary | ICD-10-CM

## 2013-01-26 DIAGNOSIS — Z713 Dietary counseling and surveillance: Secondary | ICD-10-CM | POA: Insufficient documentation

## 2013-01-26 LAB — POCT URINALYSIS DIP (DEVICE)
Bilirubin Urine: NEGATIVE
Hgb urine dipstick: NEGATIVE
Nitrite: NEGATIVE
Specific Gravity, Urine: 1.02 (ref 1.005–1.030)
Urobilinogen, UA: 0.2 mg/dL (ref 0.0–1.0)
pH: 6 (ref 5.0–8.0)

## 2013-01-26 MED ORDER — GLYBURIDE 5 MG PO TABS: 5.0000 mg | ORAL_TABLET | Freq: Two times a day (BID) | ORAL | Status: DC

## 2013-01-26 MED ORDER — METFORMIN HCL 500 MG PO TABS: 500.0000 mg | ORAL_TABLET | Freq: Every day | ORAL | Status: DC

## 2013-01-26 NOTE — Progress Notes (Signed)
Pt is a 32 yo [id number] @ [redacted]w[redacted]d with probable class B DM although not sure who is here today for routine f/u.   Sugars are in the 130s, 140s most of the time fasting 130-150 post prandial Pt is not currently following a diabetic diet  No lows Nausea has improved. No ctx, LOF, VB.   O: see flowsheet  A/P:  1) class b DM - fetal echo ordered as is ophtho consult  - will refer back to Diabetes education for more diet counseling and discussion -given elevation in sugars fasting, control is suboptimal - will start glyburide 5mg  BID and metformin 500mg  qhs   2) PTL precautions discussed 3) f/u in 2 weeks given medication changes. Requested pt bring labs.

## 2013-01-26 NOTE — Progress Notes (Signed)
Pulse: 82 Pt complains of headaches.

## 2013-01-26 NOTE — Patient Instructions (Addendum)
Pregnancy - Second Trimester The second trimester of pregnancy (3 to 6 months) is a period of rapid growth for you and your baby. At the end of the sixth month, your baby is about 9 inches long and weighs 1 1/2 pounds. You will begin to feel the baby move between 18 and 20 weeks of the pregnancy. This is called quickening. Weight gain is faster. A clear fluid (colostrum) may leak out of your breasts. You may feel small contractions of the womb (uterus). This is known as false labor or Braxton-Hicks contractions. This is like a practice for labor when the baby is ready to be born. Usually, the problems with morning sickness have usually passed by the end of your first trimester. Some women develop small dark blotches (called cholasma, mask of pregnancy) on their face that usually goes away after the baby is born. Exposure to the sun makes the blotches worse. Acne may also develop in some pregnant women and pregnant women who have acne, may find that it goes away. PRENATAL EXAMS  Blood work may continue to be done during prenatal exams. These tests are done to check on your health and the probable health of your baby. Blood work is used to follow your blood levels (hemoglobin). Anemia (low hemoglobin) is common during pregnancy. Iron and vitamins are given to help prevent this. You will also be checked for diabetes between 24 and 28 weeks of the pregnancy. Some of the previous blood tests may be repeated.  The size of the uterus is measured during each visit. This is to make sure that the baby is continuing to grow properly according to the dates of the pregnancy.  Your blood pressure is checked every prenatal visit. This is to make sure you are not getting toxemia.  Your urine is checked to make sure you do not have an infection, diabetes or protein in the urine.  Your weight is checked often to make sure gains are happening at the suggested rate. This is to ensure that both you and your baby are  growing normally.  Sometimes, an ultrasound is performed to confirm the proper growth and development of the baby. This is a test which bounces harmless sound waves off the baby so your caregiver can more accurately determine due dates. Sometimes, a test is done on the amniotic fluid surrounding the baby. This test is called an amniocentesis. The amniotic fluid is obtained by sticking a needle into the belly (abdomen). This is done to check the chromosomes in instances where there is a concern about possible genetic problems with the baby. It is also sometimes done near the end of pregnancy if an early delivery is required. In this case, it is done to help make sure the baby's lungs are mature enough for the baby to live outside of the womb. CHANGES OCCURING IN THE SECOND TRIMESTER OF PREGNANCY Your body goes through many changes during pregnancy. They vary from person to person. Talk to your caregiver about changes you notice that you are concerned about.  During the second trimester, you will likely have an increase in your appetite. It is normal to have cravings for certain foods. This varies from person to person and pregnancy to pregnancy.  Your lower abdomen will begin to bulge.  You may have to urinate more often because the uterus and baby are pressing on your bladder. It is also common to get more bladder infections during pregnancy. You can help this by drinking lots of fluids   and emptying your bladder before and after intercourse.  You may begin to get stretch marks on your hips, abdomen, and breasts. These are normal changes in the body during pregnancy. There are no exercises or medicines to take that prevent this change.  You may begin to develop swollen and bulging veins (varicose veins) in your legs. Wearing support hose, elevating your feet for 15 minutes, 3 to 4 times a day and limiting salt in your diet helps lessen the problem.  Heartburn may develop as the uterus grows and  pushes up against the stomach. Antacids recommended by your caregiver helps with this problem. Also, eating smaller meals 4 to 5 times a day helps.  Constipation can be treated with a stool softener or adding bulk to your diet. Drinking lots of fluids, and eating vegetables, fruits, and whole grains are helpful.  Exercising is also helpful. If you have been very active up until your pregnancy, most of these activities can be continued during your pregnancy. If you have been less active, it is helpful to start an exercise program such as walking.  Hemorrhoids may develop at the end of the second trimester. Warm sitz baths and hemorrhoid cream recommended by your caregiver helps hemorrhoid problems.  Backaches may develop during this time of your pregnancy. Avoid heavy lifting, wear low heal shoes, and practice good posture to help with backache problems.  Some pregnant women develop tingling and numbness of their hand and fingers because of swelling and tightening of ligaments in the wrist (carpel tunnel syndrome). This goes away after the baby is born.  As your breasts enlarge, you may have to get a bigger bra. Get a comfortable, cotton, support bra. Do not get a nursing bra until the last month of the pregnancy if you will be nursing the baby.  You may get a dark line from your belly button to the pubic area called the linea nigra.  You may develop rosy cheeks because of increase blood flow to the face.  You may develop spider looking lines of the face, neck, arms, and chest. These go away after the baby is born. HOME CARE INSTRUCTIONS   It is extremely important to avoid all smoking, herbs, alcohol, and unprescribed drugs during your pregnancy. These chemicals affect the formation and growth of the baby. Avoid these chemicals throughout the pregnancy to ensure the delivery of a healthy infant.  Most of your home care instructions are the same as suggested for the first trimester of your  pregnancy. Keep your caregiver's appointments. Follow your caregiver's instructions regarding medicine use, exercise, and diet.  During pregnancy, you are providing food for you and your baby. Continue to eat regular, well-balanced meals. Choose foods such as meat, fish, milk and other low fat dairy products, vegetables, fruits, and whole-grain breads and cereals. Your caregiver will tell you of the ideal weight gain.  A physical sexual relationship may be continued up until near the end of pregnancy if there are no other problems. Problems could include early (premature) leaking of amniotic fluid from the membranes, vaginal bleeding, abdominal pain, or other medical or pregnancy problems.  Exercise regularly if there are no restrictions. Check with your caregiver if you are unsure of the safety of some of your exercises. The greatest weight gain will occur in the last 2 trimesters of pregnancy. Exercise will help you:  Control your weight.  Get you in shape for labor and delivery.  Lose weight after you have the baby.  Wear   a good support or jogging bra for breast tenderness during pregnancy. This may help if worn during sleep. Pads or tissues may be used in the bra if you are leaking colostrum.  Do not use hot tubs, steam rooms or saunas throughout the pregnancy.  Wear your seat belt at all times when driving. This protects you and your baby if you are in an accident.  Avoid raw meat, uncooked cheese, cat litter boxes, and soil used by cats. These carry germs that can cause birth defects in the baby.  The second trimester is also a good time to visit your dentist for your dental health if this has not been done yet. Getting your teeth cleaned is okay. Use a soft toothbrush. Brush gently during pregnancy.  It is easier to leak urine during pregnancy. Tightening up and strengthening the pelvic muscles will help with this problem. Practice stopping your urination while you are going to the  bathroom. These are the same muscles you need to strengthen. It is also the muscles you would use as if you were trying to stop from passing gas. You can practice tightening these muscles up 10 times a set and repeating this about 3 times per day. Once you know what muscles to tighten up, do not perform these exercises during urination. It is more likely to contribute to an infection by backing up the urine.  Ask for help if you have financial, counseling, or nutritional needs during pregnancy. Your caregiver will be able to offer counseling for these needs as well as refer you for other special needs.  Your skin may become oily. If so, wash your face with mild soap, use non-greasy moisturizer and oil or cream based makeup. MEDICINES AND DRUG USE IN PREGNANCY  Take prenatal vitamins as directed. The vitamin should contain 1 milligram of folic acid. Keep all vitamins out of reach of children. Only a couple vitamins or tablets containing iron may be fatal to a baby or young child when ingested.  Avoid use of all medicines, including herbs, over-the-counter medicines, not prescribed or suggested by your caregiver. Only take over-the-counter or prescription medicines for pain, discomfort, or fever as directed by your caregiver. Do not use aspirin.  Let your caregiver also know about herbs you may be using.  Alcohol is related to a number of birth defects. This includes fetal alcohol syndrome. All alcohol, in any form, should be avoided completely. Smoking will cause low birth rate and premature babies.  Street or illegal drugs are very harmful to the baby. They are absolutely forbidden. A baby born to an addicted mother will be addicted at birth. The baby will go through the same withdrawal an adult does. SEEK MEDICAL CARE IF:  You have any concerns or worries during your pregnancy. It is better to call with your questions if you feel they cannot wait, rather than worry about them. SEEK IMMEDIATE  MEDICAL CARE IF:   An unexplained oral temperature above 102 F (38.9 C) develops, or as your caregiver suggests.  You have leaking of fluid from the vagina (birth canal). If leaking membranes are suspected, take your temperature and tell your caregiver of this when you call.  There is vaginal spotting, bleeding, or passing clots. Tell your caregiver of the amount and how many pads are used. Light spotting in pregnancy is common, especially following intercourse.  You develop a bad smelling vaginal discharge with a change in the color from clear to white.  You continue to feel   sick to your stomach (nauseated) and have no relief from remedies suggested. You vomit blood or coffee ground-like materials.  You lose more than 2 pounds of weight or gain more than 2 pounds of weight over 1 week, or as suggested by your caregiver.  You notice swelling of your face, hands, feet, or legs.  You get exposed to German measles and have never had them.  You are exposed to fifth disease or chickenpox.  You develop belly (abdominal) pain. Round ligament discomfort is a common non-cancerous (benign) cause of abdominal pain in pregnancy. Your caregiver still must evaluate you.  You develop a bad headache that does not go away.  You develop fever, diarrhea, pain with urination, or shortness of breath.  You develop visual problems, blurry, or double vision.  You fall or are in a car accident or any kind of trauma.  There is mental or physical violence at home. Document Released: 06/05/2001 Document Revised: 03/05/2012 Document Reviewed: 12/08/2008 [employer] Patient Information 2014 [employer].  

## 2013-01-26 NOTE — Progress Notes (Signed)
Reviewed CBG log book. Consult requested by Dr. Reola Calkins to recommend initiation of medication. Consult recommend Glyberide 5mg  AM and HS. Metformin 500 mg qd.

## 2013-02-13 ENCOUNTER — Ambulatory Visit (HOSPITAL_COMMUNITY)
Admission: RE | Admit: 2013-02-13 | Discharge: 2013-02-13 | Disposition: A | Discharge status: Home / Self Care | Payer: Medicaid Other | Source: Ambulatory Visit | Attending: Obstetrics and Gynecology | Admitting: Obstetrics and Gynecology

## 2013-02-13 DIAGNOSIS — O99810 Abnormal glucose complicating pregnancy: Secondary | ICD-10-CM | POA: Insufficient documentation

## 2013-02-13 DIAGNOSIS — O209 Hemorrhage in early pregnancy, unspecified: Secondary | ICD-10-CM

## 2013-02-13 DIAGNOSIS — Z3689 Encounter for other specified antenatal screening: Secondary | ICD-10-CM | POA: Insufficient documentation

## 2013-02-13 DIAGNOSIS — Z3482 Encounter for supervision of other normal pregnancy, second trimester: Secondary | ICD-10-CM

## 2013-02-13 DIAGNOSIS — O24112 Pre-existing diabetes mellitus, type 2, in pregnancy, second trimester: Secondary | ICD-10-CM

## 2013-02-13 DIAGNOSIS — B951 Streptococcus, group B, as the cause of diseases classified elsewhere: Secondary | ICD-10-CM

## 2013-02-16 ENCOUNTER — Encounter: Payer: Medicaid Other | Attending: Obstetrics & Gynecology | Admitting: *Deleted

## 2013-02-16 ENCOUNTER — Ambulatory Visit (INDEPENDENT_AMBULATORY_CARE_PROVIDER_SITE_OTHER): Payer: Medicaid Other | Admitting: Obstetrics and Gynecology

## 2013-02-16 ENCOUNTER — Encounter: Payer: Self-pay | Admitting: Obstetrics and Gynecology

## 2013-02-16 VITALS — BP 88/53 | Temp 98.2°F | Wt 180.6 lb

## 2013-02-16 DIAGNOSIS — E119 Type 2 diabetes mellitus without complications: Secondary | ICD-10-CM

## 2013-02-16 DIAGNOSIS — Z713 Dietary counseling and surveillance: Secondary | ICD-10-CM | POA: Insufficient documentation

## 2013-02-16 DIAGNOSIS — O99810 Abnormal glucose complicating pregnancy: Secondary | ICD-10-CM | POA: Insufficient documentation

## 2013-02-16 DIAGNOSIS — O24919 Unspecified diabetes mellitus in pregnancy, unspecified trimester: Secondary | ICD-10-CM

## 2013-02-16 LAB — POCT URINALYSIS DIP (DEVICE)
Bilirubin Urine: NEGATIVE
Hgb urine dipstick: NEGATIVE
Ketones, ur: NEGATIVE mg/dL
Protein, ur: NEGATIVE mg/dL
pH: 5.5 (ref 5.0–8.0)

## 2013-02-16 NOTE — Progress Notes (Signed)
On glyburide and added  Metformin 3 days ago. Last 2 days CBGs better:F 101, 91,93; 2 hr pcb139,126,102; pcl [id number]; pcd131,141,129,106. See Dewayne Hatch. [place] reviewed> all normal; will schedule f/u anatomy to visualize spine. RLP pain.

## 2013-02-16 NOTE — Progress Notes (Signed)
Pulse- 76 Patient reports lower abdominal pain yesterday

## 2013-02-16 NOTE — Patient Instructions (Addendum)
Diabetes Meal Planning Guide The diabetes meal planning guide is a tool to help you plan your meals and snacks. It is important for people with diabetes to manage their blood glucose (sugar) levels. Choosing the right foods and the right amounts throughout your day will help control your blood glucose. Eating right can even help you improve your blood pressure and reach or maintain a healthy weight. CARBOHYDRATE COUNTING MADE EASY When you eat carbohydrates, they turn to sugar. This raises your blood glucose level. Counting carbohydrates can help you control this level so you feel better. When you plan your meals by counting carbohydrates, you can have more flexibility in what you eat and balance your medicine with your food intake. Carbohydrate counting simply means adding up the total amount of carbohydrate grams in your meals and snacks. Try to eat about the same amount at each meal. Foods with carbohydrates are listed below. Each portion below is 1 carbohydrate serving or 15 grams of carbohydrates. Ask your dietician how many grams of carbohydrates you should eat at each meal or snack. Grains and Starches  1 slice bread.   English muffin or hotdog/hamburger bun.   cup cold cereal (unsweetened).   cup cooked pasta or rice.   cup starchy vegetables (corn, potatoes, peas, beans, winter squash).  1 tortilla (6 inches).   bagel.  1 waffle or pancake (size of a CD).   cup cooked cereal.  4 to 6 small crackers. *Whole grain is recommended. Fruit  1 cup fresh unsweetened berries, melon, papaya, pineapple.  1 small fresh fruit.   banana or mango.   cup fruit juice (4 oz unsweetened).   cup canned fruit in natural juice or water.  2 tbs dried fruit.  12 to 15 grapes or cherries. Milk and Yogurt  1 cup fat-free or 1% milk.  1 cup soy milk.  6 oz light yogurt with sugar-free sweetener.  6 oz low-fat soy yogurt.  6 oz plain yogurt. Vegetables  1 cup raw or  cup  cooked is counted as 0 carbohydrates or a "free" food.  If you eat 3 or more servings at 1 meal, count them as 1 carbohydrate serving. Other Carbohydrates   oz chips or pretzels.   cup ice cream or frozen yogurt.   cup sherbet or sorbet.  2 inch square cake, no frosting.  1 tbs honey, sugar, jam, jelly, or syrup.  2 small cookies.  3 squares of graham crackers.  3 cups popcorn.  6 crackers.  1 cup broth-based soup.  Count 1 cup casserole or other mixed foods as 2 carbohydrate servings.  Foods with less than 20 calories in a serving may be counted as 0 carbohydrates or a "free" food. You may want to purchase a book or computer software that lists the carbohydrate gram counts of different foods. In addition, the nutrition facts panel on the labels of the foods you eat are a good source of this information. The label will tell you how big the serving size is and the total number of carbohydrate grams you will be eating per serving. Divide this number by 15 to obtain the number of carbohydrate servings in a portion. Remember, 1 carbohydrate serving equals 15 grams of carbohydrate. SERVING SIZES Measuring foods and serving sizes helps you make sure you are getting the right amount of food. The list below tells how big or small some common serving sizes are.  1 oz.........4 stacked dice.  3 oz.........Deck of cards.  1 tsp........Tip   of little finger.  1 tbs......Marland KitchenMarland KitchenThumb.  2 tbs.......Marland KitchenGolf ball.   cup......Marland KitchenHalf of a fist.  1 cup.......Marland KitchenA fist. SAMPLE DIABETES MEAL PLAN Below is a sample meal plan that includes foods from the grain and starches, dairy, vegetable, fruit, and meat groups. A dietician can individualize a meal plan to fit your calorie needs and tell you the number of servings needed from each food group. However, controlling the total amount of carbohydrates in your meal or snack is more important than making sure you include all of the food groups at every  meal. You may interchange carbohydrate containing foods (dairy, starches, and fruits). The meal plan below is an example of a 2000 calorie diet using carbohydrate counting. This meal plan has 17 carbohydrate servings. Breakfast  1 cup oatmeal (2 carb servings).   cup light yogurt (1 carb serving).  1 cup blueberries (1 carb serving).   cup almonds. Snack  1 large apple (2 carb servings).  1 low-fat string cheese stick. Lunch  Chicken breast salad.  1 cup spinach.   cup chopped tomatoes.  2 oz chicken breast, sliced.  2 tbs low-fat [place] dressing.  12 whole-wheat crackers (2 carb servings).  12 to 15 grapes (1 carb serving).  1 cup low-fat milk (1 carb serving). Snack  1 cup carrots.   cup hummus (1 carb serving). Dinner  3 oz broiled salmon.  1 cup brown rice (3 carb servings). Snack  1  cups steamed broccoli (1 carb serving) drizzled with 1 tsp olive oil and lemon juice.  1 cup light pudding (2 carb servings). DIABETES MEAL PLANNING WORKSHEET Your dietician can use this worksheet to help you decide how many servings of foods and what types of foods are right for you.  BREAKFAST Food Group and Servings / Carb Servings Grain/Starches __________________________________ Dairy __________________________________________ Vegetable ______________________________________ Fruit ___________________________________________ Meat __________________________________________ Fat ____________________________________________ LUNCH Food Group and Servings / Carb Servings Grain/Starches ___________________________________ Dairy ___________________________________________ Fruit ____________________________________________ Meat ___________________________________________ Fat _____________________________________________ La[place] Food Group and Servings / Carb Servings Grain/Starches ___________________________________ Dairy  ___________________________________________ Fruit ____________________________________________ Meat ___________________________________________ Fat _____________________________________________ SNACKS Food Group and Servings / Carb Servings Grain/Starches ___________________________________ Dairy ___________________________________________ Vegetable _______________________________________ Fruit ____________________________________________ Meat ___________________________________________ Fat _____________________________________________ DAILY TOTALS Starches _________________________ Vegetable ________________________ Fruit ____________________________ Dairy ____________________________ Meat ____________________________ Fat ______________________________ Document Released: 03/08/2005 Document Revised: 09/03/2011 Document Reviewed: 01/17/2009 [employer] Patient Information 2014 [employer]. Round Ligament Pain The round ligament is made up of muscle and fibrous tissue. It is attached to the uterus near the fallopian tube. The round ligament is located on both sides of the uterus and helps support the position of the uterus. It usually begins in the second trimester of pregnancy when the uterus comes out of the pelvis. The pain can come and go until the baby is delivered. Round ligament pain is not a serious problem and does not cause harm to the baby. CAUSE During pregnancy the uterus grows the most from the second trimester to delivery. As it grows, it stretches and slightly twists the round ligaments. When the uterus leans from one side to the other, the round ligament on the opposite side pulls and stretches. This can cause pain. SYMPTOMS  Pain can occur on one side or both sides. The pain is usually a short, sharp, and pinching-like. Sometimes it can be a dull, lingering and aching pain. The pain is located in the lower side of the abdomen or in the groin. The pain is internal and  usually starts deep in the groin  and moves up to the outside of the hip area. Pain can occur with:  Sudden change in position like getting out of bed or a chair.  Rolling over in bed.  Coughing or sneezing.  Walking too much.  Any type of physical activity. DIAGNOSIS  Your caregiver will make sure there are no serious problems causing the pain. When nothing serious is found, the symptoms usually indicate that the pain is from the round ligament. TREATMENT   Sit down and relax when the pain starts.  Flex your knees up to your belly.  Lay on your side with a pillow under your belly (abdomen) and another one between your legs.  Sit in a hot bath for 15 to 20 minutes or until the pain goes away. HOME CARE INSTRUCTIONS   Only take over-the-counter or prescriptions medicines for pain, discomfort or fever as directed by your caregiver.  Sit and stand slowly.  Avoid long walks if it causes pain.  Stop or lessen your physical activities if it causes pain. SEEK MEDICAL CARE IF:   The pain does not go away with any of your treatment.  You need stronger medication for the pain.  You develop back pain that you did not have before with the side pain. SEEK IMMEDIATE MEDICAL CARE IF:   You develop a temperature of 102 F (38.9 C) or higher.  You develop uterine contractions.  You develop vaginal bleeding.  You develop nausea, vomiting or diarrhea.  You develop chills.  You have pain when you urinate. Document Released: 03/20/2008 Document Revised: 09/03/2011 Document Reviewed: 03/20/2008 [facility] Patient Information 2014 [place], [place].

## 2013-03-02 ENCOUNTER — Encounter: Payer: Medicaid Other | Admitting: Obstetrics & Gynecology

## 2013-03-30 ENCOUNTER — Ambulatory Visit (INDEPENDENT_AMBULATORY_CARE_PROVIDER_SITE_OTHER): Payer: Medicaid Other | Admitting: Obstetrics and Gynecology

## 2013-03-30 ENCOUNTER — Encounter: Payer: Self-pay | Admitting: Obstetrics and Gynecology

## 2013-03-30 VITALS — BP 102/64 | Temp 98.1°F | Wt 181.3 lb

## 2013-03-30 DIAGNOSIS — Z3482 Encounter for supervision of other normal pregnancy, second trimester: Secondary | ICD-10-CM

## 2013-03-30 DIAGNOSIS — Z23 Encounter for immunization: Secondary | ICD-10-CM

## 2013-03-30 DIAGNOSIS — B951 Streptococcus, group B, as the cause of diseases classified elsewhere: Secondary | ICD-10-CM

## 2013-03-30 DIAGNOSIS — O99810 Abnormal glucose complicating pregnancy: Secondary | ICD-10-CM

## 2013-03-30 DIAGNOSIS — O2390 Unspecified genitourinary tract infection in pregnancy, unspecified trimester: Secondary | ICD-10-CM

## 2013-03-30 LAB — POCT URINALYSIS DIP (DEVICE)
Ketones, ur: NEGATIVE mg/dL
Protein, ur: NEGATIVE mg/dL
Specific Gravity, Urine: 1.015 (ref 1.005–1.030)
Urobilinogen, UA: 0.2 mg/dL (ref 0.0–1.0)
pH: 5.5 (ref 5.0–8.0)

## 2013-03-30 NOTE — Addendum Note (Signed)
Addended by: Sherre Lain A on: 03/30/2013 11:54 AM   Modules accepted: Orders

## 2013-03-30 NOTE — Progress Notes (Signed)
Pulse- 77 Patient reports lower abdominal/pelvic pressure; also reports episode of contractions last week

## 2013-03-30 NOTE — Progress Notes (Signed)
U/S scheduled 04/01/13 at 245 pm.

## 2013-03-30 NOTE — Progress Notes (Signed)
Patient doing well. CBGs all within range. Patient reports some constipation- advised to increase fiber intake, may use over the counter remedies. FM/PTL precautions reviewed. F/U anatomy US ordered

## 2013-04-01 ENCOUNTER — Encounter: Payer: Self-pay | Admitting: Obstetrics and Gynecology

## 2013-04-01 ENCOUNTER — Ambulatory Visit (HOSPITAL_COMMUNITY)
Admission: RE | Admit: 2013-04-01 | Discharge: 2013-04-01 | Disposition: A | Discharge status: Home / Self Care | Payer: Medicaid Other | Source: Ambulatory Visit | Attending: Obstetrics and Gynecology | Admitting: Obstetrics and Gynecology

## 2013-04-01 ENCOUNTER — Ambulatory Visit (HOSPITAL_COMMUNITY): Admission: RE | Admit: 2013-04-01 | Payer: Medicaid Other | Source: Ambulatory Visit

## 2013-04-01 DIAGNOSIS — O283 Abnormal ultrasonic finding on antenatal screening of mother: Secondary | ICD-10-CM | POA: Insufficient documentation

## 2013-04-01 DIAGNOSIS — O99810 Abnormal glucose complicating pregnancy: Secondary | ICD-10-CM | POA: Insufficient documentation

## 2013-04-01 DIAGNOSIS — Z3482 Encounter for supervision of other normal pregnancy, second trimester: Secondary | ICD-10-CM

## 2013-04-01 DIAGNOSIS — Z3689 Encounter for other specified antenatal screening: Secondary | ICD-10-CM | POA: Insufficient documentation

## 2013-04-04 ENCOUNTER — Encounter (HOSPITAL_COMMUNITY): Payer: Self-pay | Admitting: *Deleted

## 2013-04-04 ENCOUNTER — Inpatient Hospital Stay (HOSPITAL_COMMUNITY)
Admission: AD | Admit: 2013-04-04 | Discharge: 2013-04-04 | Disposition: A | Discharge status: Home / Self Care | Payer: Medicaid Other | Source: Ambulatory Visit | Attending: Obstetrics & Gynecology | Admitting: Obstetrics & Gynecology

## 2013-04-04 DIAGNOSIS — O99891 Other specified diseases and conditions complicating pregnancy: Secondary | ICD-10-CM | POA: Insufficient documentation

## 2013-04-04 DIAGNOSIS — E119 Type 2 diabetes mellitus without complications: Secondary | ICD-10-CM | POA: Insufficient documentation

## 2013-04-04 DIAGNOSIS — O24919 Unspecified diabetes mellitus in pregnancy, unspecified trimester: Secondary | ICD-10-CM | POA: Insufficient documentation

## 2013-04-04 DIAGNOSIS — Z3482 Encounter for supervision of other normal pregnancy, second trimester: Secondary | ICD-10-CM

## 2013-04-04 DIAGNOSIS — R109 Unspecified abdominal pain: Secondary | ICD-10-CM | POA: Insufficient documentation

## 2013-04-04 DIAGNOSIS — O283 Abnormal ultrasonic finding on antenatal screening of mother: Secondary | ICD-10-CM

## 2013-04-04 DIAGNOSIS — B951 Streptococcus, group B, as the cause of diseases classified elsewhere: Secondary | ICD-10-CM

## 2013-04-04 DIAGNOSIS — B373 Candidiasis of vulva and vagina: Secondary | ICD-10-CM

## 2013-04-04 LAB — WET PREP, GENITAL
Clue Cells Wet Prep HPF POC: NONE SEEN
Trich, Wet Prep: NONE SEEN

## 2013-04-04 LAB — URINALYSIS, ROUTINE W REFLEX MICROSCOPIC
Glucose, UA: >1000 mg/dL — AB
Hgb urine dipstick: NEGATIVE
Nitrite: NEGATIVE
Specific Gravity, Urine: 1.01 (ref 1.005–1.030)
pH: 6.5 (ref 5.0–8.0)

## 2013-04-04 LAB — URINE MICROSCOPIC-ADD ON

## 2013-04-04 MED ORDER — BUTORPHANOL TARTRATE 1 MG/ML IJ SOLN
2.0000 mg | Freq: Once | INTRAMUSCULAR | Status: DC
  Filled 2013-04-04: qty 2

## 2013-04-04 MED ORDER — FLUCONAZOLE 150 MG PO TABS
150.0000 mg | ORAL_TABLET | Freq: Once | ORAL | Status: AC
  Administered 2013-04-04: 150 mg via ORAL
  Filled 2013-04-04 (×2): qty 1

## 2013-04-04 MED ORDER — BUTORPHANOL TARTRATE 1 MG/ML IJ SOLN
2.0000 mg | Freq: Once | INTRAMUSCULAR | Status: AC
  Administered 2013-04-04: 2 mg via INTRAMUSCULAR

## 2013-04-04 NOTE — MAU Note (Signed)
Pt presents with complaint of severe abd pain since last pm, worsening now. Denies bleeding or ROM

## 2013-04-04 NOTE — MAU Provider Note (Signed)
History     CSN: [id number]  Arrival date and time: 04/04/13 0459   First Provider Initiated Contact with Patient 04/04/13 0522      Chief Complaint  Patient presents with  . Abdominal Pain   Abdominal Pain Pertinent negatives include no constipation, dysuria, fever, frequency, headaches, myalgias, nausea or vomiting.   Pt is a 32 y.o [name] with class B gest DM who presents at 26w3 for severe abdominal cramping. Started yesterday evening, has been constant since that time. Described as 8-9/10 pain, unrelenting. No alleviating factors although patient has not really tried much of anything either. No exacerbating factors other than pressure to that area. No fluid leakage, no vaginal bleeding. Attests to vaginal discharge, yellowish in coloration, also assc with pruritis. No hematuria, dysuria, frequency or urgnecy, nml BM.  PNC at [facility] since 1st trimester. Dating by LMP confirmed by [place] (4 day variation), had nml primary genetic screen. Normal anatomy with right pyelectasis. 205 early GTT.   OB History   Grav Para Term Preterm Abortions TAB SAB Ect Mult Living   5 3 3  1 1    3       Past Medical History  Diagnosis Date  . Asthma     does not use inhaler    Past Surgical History  Procedure Laterality Date  . No past surgeries      Family History  Problem Relation Age of Onset  . Diabetes Mother   . Hypertension Mother   . Arthritis Mother   . Hyperlipidemia Father     History  Substance Use Topics  . Smoking status: Never Smoker   . Smokeless tobacco: Never Used  . Alcohol Use: No    Allergies: No Known Allergies  Prescriptions prior to admission  Medication Sig Dispense Refill  . ACCU-CHEK FASTCLIX LANCETS MISC 1 each by Percutaneous route 4 (four) times daily.  100 each  12  . glucose blood test strip Use as instructed- check blood sugar 4 times daily.  50 each  12  . glyBURIDE (DIABETA) 5 MG tablet Take 1 tablet (5 mg total) by mouth 2 (two) times daily  with a meal.  60 tablet  3  . metFORMIN (GLUCOPHAGE) 500 MG tablet Take 1 tablet (500 mg total) by mouth at bedtime.  30 tablet  2  . Prenat w/o A Vit-FeFum-FePo-FA (CONCEPT OB) 130-92.4-1 MG CAPS Take 1 capsule by mouth daily.  30 capsule  11    Review of Systems  Constitutional: Negative for fever and chills.  HENT: Negative for congestion and sore throat.   Eyes: Negative for blurred vision and pain.  Respiratory: Negative for cough and wheezing.   Cardiovascular: Negative for chest pain and palpitations.  Gastrointestinal: Positive for abdominal pain. Negative for heartburn, nausea, vomiting and constipation.  Genitourinary: Negative for dysuria, urgency and frequency.  Musculoskeletal: Negative for myalgias.  Skin: Negative for rash.  Neurological: Negative for dizziness, seizures, loss of consciousness, weakness and headaches.  Endo/Heme/Allergies: Does not bruise/bleed easily.  Psychiatric/Behavioral: Negative for depression.   Physical Exam   Blood pressure 118/46, pulse 81, temperature 98.2 F (36.8 C), temperature source Oral, resp. rate 18, height 5' (1.524 m), weight 83.008 kg (183 lb), last menstrual period 10/05/2012, SpO2 99.00%.  Physical Exam  Constitutional: She is oriented to person, place, and time. She appears well-developed and well-nourished. No distress.  In apparent discomfort   HENT:  Head: Normocephalic and atraumatic.  Eyes: EOM are normal.  Neck: Neck supple.  Cardiovascular:  Normal rate, regular rhythm and normal heart sounds.   Respiratory: Effort normal and breath sounds normal. No respiratory distress.  GI: Soft. Bowel sounds are normal. There is tenderness. There is no rebound.  Musculoskeletal: Normal range of motion. She exhibits no edema.  Neurological: She is alert and oriented to person, place, and time.  Skin: Skin is warm and dry. No erythema.  Psychiatric: She has a normal mood and affect. Her behavior is normal.  Uterine activity- no  contractions Pelvic- vaginal discharge assoc with yellow fluid leakage present in vag vault; thick closed and high  MAU Course  Procedures  MDM U/A straight cath Wet prep GCCT  Assessment and Plan  Wanda Harris is a 33 y.o [id number] at 26w3 who presents with abdominal discomfort, not having uterine contractions  1. Abd pain- yeast seen on wet prep, U/A with glucose -s/p 2gm stadol here -received diflucan 150mg  -sending for GCCT  2. Class B DM- CBC 180s here -on glyberide and metformin -[facility] to follow at [facility]  3. FHR- reassuring tracing -cat I Anselm Lis 04/04/2013, 5:27 AM   I have participated in the care of this patient and I agree with the above. [name] 8:48 AM 04/04/2013

## 2013-04-05 LAB — GC/CHLAMYDIA PROBE AMP: CT Probe RNA: NEGATIVE

## 2013-04-20 ENCOUNTER — Encounter: Payer: Medicaid Other | Admitting: Obstetrics and Gynecology

## 2013-04-23 ENCOUNTER — Telehealth: Payer: Self-pay | Admitting: Obstetrics and Gynecology

## 2013-04-23 NOTE — Telephone Encounter (Signed)
Patient called with c/o cold symptoms worsening, fever and blood tinged vomit. States she can't keep anything down X 2 days. Advised patient to go to MAU to get evaluated. Patient agrees and satisfied.

## 2013-05-04 ENCOUNTER — Ambulatory Visit (INDEPENDENT_AMBULATORY_CARE_PROVIDER_SITE_OTHER): Payer: Medicaid Other | Admitting: Obstetrics & Gynecology

## 2013-05-04 VITALS — BP 107/67 | Temp 98.1°F | Wt 181.4 lb

## 2013-05-04 DIAGNOSIS — O2390 Unspecified genitourinary tract infection in pregnancy, unspecified trimester: Secondary | ICD-10-CM

## 2013-05-04 DIAGNOSIS — N760 Acute vaginitis: Secondary | ICD-10-CM

## 2013-05-04 DIAGNOSIS — O2243 Hemorrhoids in pregnancy, third trimester: Secondary | ICD-10-CM

## 2013-05-04 DIAGNOSIS — O228X9 Other venous complications in pregnancy, unspecified trimester: Secondary | ICD-10-CM

## 2013-05-04 DIAGNOSIS — O99810 Abnormal glucose complicating pregnancy: Secondary | ICD-10-CM

## 2013-05-04 DIAGNOSIS — O24419 Gestational diabetes mellitus in pregnancy, unspecified control: Secondary | ICD-10-CM

## 2013-05-04 LAB — GLUCOSE, CAPILLARY: Glucose-Capillary: 151 mg/dL — ABNORMAL HIGH (ref 70–99)

## 2013-05-04 LAB — POCT URINALYSIS DIP (DEVICE)
Bilirubin Urine: NEGATIVE
Glucose, UA: 500 mg/dL — AB
Nitrite: NEGATIVE
Urobilinogen, UA: 0.2 mg/dL (ref 0.0–1.0)
pH: 6 (ref 5.0–8.0)

## 2013-05-04 MED ORDER — HYDROCORTISONE ACETATE 25 MG RE SUPP: 25.0000 mg | Freq: Two times a day (BID) | RECTAL | Status: DC

## 2013-05-04 MED ORDER — FLUCONAZOLE 150 MG PO TABS: 150.0000 mg | ORAL_TABLET | ORAL | Status: DC

## 2013-05-04 NOTE — Patient Instructions (Signed)
Return to clinic for any obstetric concerns or go to MAU for evaluation  

## 2013-05-04 NOTE — Progress Notes (Signed)
Pulse: 84 Has itching and burning in vaginal area.

## 2013-05-04 NOTE — Progress Notes (Signed)
Forgot log book, reports having sugars within normal ranges but has 4+ glucosuria.  Random CBG 151 and this is a fasting value. She was told to increase Glyburide to 7.5 mg bid, continue Metformin 500 mg bid. Will reevaluate in one week.  Emphasized importance of bringing log book to ensure adequate glycemic control. Vulvovaginitis for a few weeks, on exam, erythematous vulva noted, white discharge seen.  Tender on exam.  Pelvic cultures done.  Concerned about yeast infection, will rule out concormitant organisms.  Diflucan prescribed.   Also reports hemorrhoids not alleviated by OTC methods, Anusol prescribed.  Also reports left ear pain; was advised to go to PCP/urgent care for further evaluation. No other complaints or concerns.  Fetal movement and labor precautions reviewed. Will start 2x/week testing at 32 weeks

## 2013-05-05 LAB — GC/CHLAMYDIA PROBE AMP: GC Probe RNA: NEGATIVE

## 2013-05-05 LAB — WET PREP, GENITAL

## 2013-05-11 ENCOUNTER — Ambulatory Visit (INDEPENDENT_AMBULATORY_CARE_PROVIDER_SITE_OTHER): Payer: Medicaid Other | Admitting: Obstetrics & Gynecology

## 2013-05-11 VITALS — BP 104/62 | Temp 97.8°F | Wt 181.8 lb

## 2013-05-11 DIAGNOSIS — Z23 Encounter for immunization: Secondary | ICD-10-CM

## 2013-05-11 DIAGNOSIS — O24419 Gestational diabetes mellitus in pregnancy, unspecified control: Secondary | ICD-10-CM

## 2013-05-11 DIAGNOSIS — O358XX0 Maternal care for other (suspected) fetal abnormality and damage, not applicable or unspecified: Secondary | ICD-10-CM

## 2013-05-11 DIAGNOSIS — O99810 Abnormal glucose complicating pregnancy: Secondary | ICD-10-CM

## 2013-05-11 DIAGNOSIS — Z3483 Encounter for supervision of other normal pregnancy, third trimester: Secondary | ICD-10-CM

## 2013-05-11 DIAGNOSIS — O359XX1 Maternal care for (suspected) fetal abnormality and damage, unspecified, fetus 1: Secondary | ICD-10-CM

## 2013-05-11 LAB — POCT URINALYSIS DIP (DEVICE)
Bilirubin Urine: NEGATIVE
Ketones, ur: NEGATIVE mg/dL
Leukocytes, UA: NEGATIVE
Nitrite: NEGATIVE
Specific Gravity, Urine: 1.025 (ref 1.005–1.030)

## 2013-05-11 MED ORDER — METFORMIN HCL 500 MG PO TABS: 500.0000 mg | ORAL_TABLET | Freq: Two times a day (BID) | ORAL | Status: DC

## 2013-05-11 MED ORDER — GLYBURIDE 5 MG PO TABS: 10.0000 mg | ORAL_TABLET | Freq: Two times a day (BID) | ORAL | Status: DC

## 2013-05-11 MED ORDER — TETANUS-DIPHTH-ACELL PERTUSSIS 5-2.5-18.5 LF-MCG/0.5 IM SUSP: 0.5000 mL | Freq: Once | INTRAMUSCULAR | Status: DC

## 2013-05-11 NOTE — Progress Notes (Signed)
Pulse- 64 Edema-feet  Pain/pressure-lower abd Pt reports having vomiting since Saturday only in the am and after finishing shower.  Vomit yellow fluid

## 2013-05-11 NOTE — Progress Notes (Addendum)
Appointment scheduled for NST on 05/14/13 and ultrasound for growth on 05/14/13.  Additional appointments for OB f/u, NST and BPP given.  AVS printed and given to patient.

## 2013-05-11 NOTE — Progress Notes (Signed)
Needs TDAP today.  Fastings 120-156.  2 hr pp breakfast 132-155; 2 hr lunch 117-156; 2 hr dinner 11-181 Pt not eating bedtime snack.  Does not want to start insulin.  Will increase glyburide to 10 mg bid and metformin to 500 bid.  f not better by next week will change to insulin. 2x week testing starting Thursday.

## 2013-05-11 NOTE — Progress Notes (Incomplete)
Nutrition note: consult for GDM diet f/u  Pt has lost 3.2# @ [redacted]w[redacted]d but pt continues to gain slowly each week. Pt reports eating 3 meals & 2 snacks/d. Pt reports no bedtime snack. Pt's BS have been: fasting: 120-160 and pp: 117-260. Pt reports she has been checking her BS right after she finishes her meals instead of 2hrs after her first bite, which could explain some of her highs pp. Per MD, pt will be increasing meds so hopefully that will help with her fasting BS. Pt is taking PNV. Pt reports no N/V or heartburn. Pt reports walking occ. Reviewed GDM diet with pt and encouraged pt to check BS 2hr after start of each meal. Encouraged walking daily.  Discussed wt gain goals of 11-20# or 0.5#/wk. Pt agrees to check BS at proper time and add walking/ physical activity during the day. Pt has WIC and plans to BF. F/u in 2-4 wks Blondell Reveal, MS, RD, LDN

## 2013-05-11 NOTE — Progress Notes (Deleted)
Nutrition note: consult for GDM diet f/u Pt has lost 3.2# @ [redacted]w[redacted]d but pt continues to gani slowly each week

## 2013-05-13 ENCOUNTER — Other Ambulatory Visit: Payer: Self-pay | Admitting: *Deleted

## 2013-05-13 DIAGNOSIS — O99810 Abnormal glucose complicating pregnancy: Secondary | ICD-10-CM

## 2013-05-14 ENCOUNTER — Ambulatory Visit (INDEPENDENT_AMBULATORY_CARE_PROVIDER_SITE_OTHER): Payer: Medicaid Other | Admitting: *Deleted

## 2013-05-14 ENCOUNTER — Encounter: Payer: Self-pay | Admitting: *Deleted

## 2013-05-14 ENCOUNTER — Ambulatory Visit (HOSPITAL_COMMUNITY)
Admission: RE | Admit: 2013-05-14 | Discharge: 2013-05-14 | Disposition: A | Discharge status: Home / Self Care | Payer: Medicaid Other | Source: Ambulatory Visit | Attending: Obstetrics & Gynecology | Admitting: Obstetrics & Gynecology

## 2013-05-14 ENCOUNTER — Other Ambulatory Visit: Payer: Self-pay | Admitting: Obstetrics & Gynecology

## 2013-05-14 VITALS — BP 98/69

## 2013-05-14 DIAGNOSIS — O359XX1 Maternal care for (suspected) fetal abnormality and damage, unspecified, fetus 1: Secondary | ICD-10-CM

## 2013-05-14 DIAGNOSIS — O358XX0 Maternal care for other (suspected) fetal abnormality and damage, not applicable or unspecified: Secondary | ICD-10-CM | POA: Insufficient documentation

## 2013-05-14 DIAGNOSIS — O99810 Abnormal glucose complicating pregnancy: Secondary | ICD-10-CM

## 2013-05-14 DIAGNOSIS — O24419 Gestational diabetes mellitus in pregnancy, unspecified control: Secondary | ICD-10-CM

## 2013-05-14 NOTE — Progress Notes (Signed)
P=88 

## 2013-05-18 ENCOUNTER — Encounter: Payer: Medicaid Other | Attending: Obstetrics & Gynecology | Admitting: *Deleted

## 2013-05-18 ENCOUNTER — Ambulatory Visit (INDEPENDENT_AMBULATORY_CARE_PROVIDER_SITE_OTHER): Payer: Medicaid Other | Admitting: Family Medicine

## 2013-05-18 ENCOUNTER — Other Ambulatory Visit: Payer: Self-pay | Admitting: Obstetrics & Gynecology

## 2013-05-18 VITALS — BP 114/62 | Wt 182.6 lb

## 2013-05-18 DIAGNOSIS — O283 Abnormal ultrasonic finding on antenatal screening of mother: Secondary | ICD-10-CM

## 2013-05-18 DIAGNOSIS — O99810 Abnormal glucose complicating pregnancy: Secondary | ICD-10-CM

## 2013-05-18 DIAGNOSIS — O24419 Gestational diabetes mellitus in pregnancy, unspecified control: Secondary | ICD-10-CM

## 2013-05-18 DIAGNOSIS — O289 Unspecified abnormal findings on antenatal screening of mother: Secondary | ICD-10-CM

## 2013-05-18 LAB — POCT URINALYSIS DIP (DEVICE)
Glucose, UA: 500 mg/dL — AB
Hgb urine dipstick: NEGATIVE
Leukocytes, UA: NEGATIVE
Specific Gravity, Urine: >=1.03 (ref 1.005–1.030)
Urobilinogen, UA: 0.2 mg/dL (ref 0.0–1.0)

## 2013-05-18 MED ORDER — INSULIN NPH (HUMAN) (ISOPHANE) 100 UNIT/ML ~~LOC~~ SUSP: 10.0000 [IU] | Freq: Two times a day (BID) | SUBCUTANEOUS | Status: DC

## 2013-05-18 MED ORDER — "INSULIN SYRINGE 31G X 5/16"" 1 ML MISC": 1.0000 [IU] | Status: DC

## 2013-05-18 MED ORDER — INSULIN ASPART 100 UNIT/ML ~~LOC~~ SOLN: 15.0000 [IU] | Freq: Three times a day (TID) | SUBCUTANEOUS | Status: DC

## 2013-05-18 NOTE — Progress Notes (Signed)
NST reviewed and reactive. FBS 120-145 2 hour pp 86-216 Will need to start insulin--weight based--has insurance NPH 10 u bid and Novolog 15 u tid U/S on 11/20 shows 2273 gms, 5 lb, 81%, AC ahead, AFI 17, vtx

## 2013-05-18 NOTE — Progress Notes (Signed)
DIABETES: New insulin start per Dr. Shawnie Pons. Via Teach Back patient demonstrated appropriate technique for drawing insulin and administration. Will be administering NPH 10 units with Breakfast and at bedtime. Administer  Novolog 15 units at Breakfast and dinner. Will discuss omitting lunch dose with Dr. Shawnie Pons. Patient will test 5 times daily for 1 week. Will reevaluate dosing in one week. In discussion with patient about her diet, she is consuming large amounts of beans and rice with each meal. I counseled about moderation and serving sizes. I do not feel patient will decrease frequency or portion sizes.

## 2013-05-18 NOTE — Progress Notes (Signed)
P=79 pt c/o of pressure lower abd irregular during the day.

## 2013-05-18 NOTE — Progress Notes (Signed)
Pt needs growh 4 weeks after last growth--with MFM

## 2013-05-18 NOTE — Progress Notes (Signed)
NST reactive on 05/14/13. 

## 2013-05-18 NOTE — Patient Instructions (Addendum)
Following an appropriate diet and keeping your blood sugar under control is the most important thing to do for your health and that of your unborn baby.  Please check your blood sugar 4 times daily.  Please keep accurate BS logs and bring them with you to every visit.  Please bring your meter also.  Goals for Blood sugar should be: 1. Fasting (first thing in the morning before eating) should be less than 90.   2.  2 hours after meals should be less than 120.  Please eat 3 meals and 3 snacks.  Include protein (meat, dairy-cheese, eggs, nuts) with all meals.  Be mindful that carbohydrates increase your blood sugar.  Not just sweet food (cookies, cake, donuts, fruit, juice, soda) but also bread, pasta, rice, and potatoes.  You have to limit how many carbs you are eating.  Adding exercise, as little as 30 minutes a day can decrease your blood sugar.  Third Trimester of Pregnancy The third trimester is from week 29 through week 42, months 7 through 9. The third trimester is a time when the fetus is growing rapidly. At the end of the ninth month, the fetus is about 20 inches in length and weighs 6 10 pounds.  BODY CHANGES Your body goes through many changes during pregnancy. The changes vary from woman to woman.   Your weight will continue to increase. You can expect to gain 25 35 pounds (11 16 kg) by the end of the pregnancy.  You may begin to get stretch marks on your hips, abdomen, and breasts.  You may urinate more often because the fetus is moving lower into your pelvis and pressing on your bladder.  You may develop or continue to have heartburn as a result of your pregnancy.  You may develop constipation because certain hormones are causing the muscles that push waste through your intestines to slow down.  You may develop hemorrhoids or swollen, bulging veins (varicose veins).  You may have pelvic pain because of the weight gain and pregnancy hormones relaxing your joints between the  bones in your pelvis. Back aches may result from over exertion of the muscles supporting your posture.  Your breasts will continue to grow and be tender. A yellow discharge may leak from your breasts called colostrum.  Your belly button may stick out.  You may feel short of breath because of your expanding uterus.  You may notice the fetus "dropping," or moving lower in your abdomen.  You may have a bloody mucus discharge. This usually occurs a few days to a week before labor begins.  Your cervix becomes thin and soft (effaced) near your due date. WHAT TO EXPECT AT YOUR PRENATAL EXAMS  You will have prenatal exams every 2 weeks until week 36. Then, you will have weekly prenatal exams. During a routine prenatal visit:  You will be weighed to make sure you and the fetus are growing normally.  Your blood pressure is taken.  Your abdomen will be measured to track your baby's growth.  The fetal heartbeat will be listened to.  Any test results from the previous visit will be discussed.  You may have a cervical check near your due date to see if you have effaced. At around 36 weeks, your caregiver will check your cervix. At the same time, your caregiver will also perform a test on the secretions of the vaginal tissue. This test is to determine if a type of bacteria, Group B streptococcus, is present. Your  caregiver will explain this further. Your caregiver may ask you:  What your birth plan is.  How you are feeling.  If you are feeling the baby move.  If you have had any abnormal symptoms, such as leaking fluid, bleeding, severe headaches, or abdominal cramping.  If you have any questions. Other tests or screenings that may be performed during your third trimester include:  Blood tests that check for low iron levels (anemia).  Fetal testing to check the health, activity level, and growth of the fetus. Testing is done if you have certain medical conditions or if there are problems  during the pregnancy. FALSE LABOR You may feel small, irregular contractions that eventually go away. These are called Braxton Hicks contractions, or false labor. Contractions may last for hours, days, or even weeks before true labor sets in. If contractions come at regular intervals, intensify, or become painful, it is best to be seen by your caregiver.  SIGNS OF LABOR   Menstrual-like cramps.  Contractions that are 5 minutes apart or less.  Contractions that start on the top of the uterus and spread down to the lower abdomen and back.  A sense of increased pelvic pressure or back pain.  A watery or bloody mucus discharge that comes from the vagina. If you have any of these signs before the 37th week of pregnancy, call your caregiver right away. You need to go to the hospital to get checked immediately. HOME CARE INSTRUCTIONS   Avoid all smoking, herbs, alcohol, and unprescribed drugs. These chemicals affect the formation and growth of the baby.  Follow your caregiver's instructions regarding medicine use. There are medicines that are either safe or unsafe to take during pregnancy.  Exercise only as directed by your caregiver. Experiencing uterine cramps is a good sign to stop exercising.  Continue to eat regular, healthy meals.  Wear a good support bra for breast tenderness.  Do not use hot tubs, steam rooms, or saunas.  Wear your seat belt at all times when driving.  Avoid raw meat, uncooked cheese, cat litter boxes, and soil used by cats. These carry germs that can cause birth defects in the baby.  Take your prenatal vitamins.  Try taking a stool softener (if your caregiver approves) if you develop constipation. Eat more high-fiber foods, such as fresh vegetables or fruit and whole grains. Drink plenty of fluids to keep your urine clear or pale yellow.  Take warm sitz baths to soothe any pain or discomfort caused by hemorrhoids. Use hemorrhoid cream if your caregiver  approves.  If you develop varicose veins, wear support hose. Elevate your feet for 15 minutes, 3 4 times a day. Limit salt in your diet.  Avoid heavy lifting, wear low heal shoes, and practice good posture.  Rest a lot with your legs elevated if you have leg cramps or low back pain.  Visit your dentist if you have not gone during your pregnancy. Use a soft toothbrush to brush your teeth and be gentle when you floss.  A sexual relationship may be continued unless your caregiver directs you otherwise.  Do not travel far distances unless it is absolutely necessary and only with the approval of your caregiver.  Take prenatal classes to understand, practice, and ask questions about the labor and delivery.  Make a trial run to the hospital.  Pack your hospital bag.  Prepare the baby's nursery.  Continue to go to all your prenatal visits as directed by your caregiver. SEEK MEDICAL CARE  IF:  You are unsure if you are in labor or if your water has broken.  You have dizziness.  You have mild pelvic cramps, pelvic pressure, or nagging pain in your abdominal area.  You have persistent nausea, vomiting, or diarrhea.  You have a bad smelling vaginal discharge.  You have pain with urination. SEEK IMMEDIATE MEDICAL CARE IF:   You have a fever.  You are leaking fluid from your vagina.  You have spotting or bleeding from your vagina.  You have severe abdominal cramping or pain.  You have rapid weight loss or gain.  You have shortness of breath with chest pain.  You notice sudden or extreme swelling of your face, hands, ankles, feet, or legs.  You have not felt your baby move in over an hour.  You have severe headaches that do not go away with medicine.  You have vision changes. Document Released: 06/05/2001 Document Revised: 02/11/2013 Document Reviewed: 08/12/2012 [facility] Patient Information 2014 [place], [place].  Breastfeeding Deciding to breastfeed is one of the best  choices you can make for you and your baby. A change in hormones during pregnancy causes your breast tissue to grow and increases the number and size of your milk ducts. These hormones also allow proteins, sugars, and fats from your blood supply to make breast milk in your milk-producing glands. Hormones prevent breast milk from being released before your baby is born as well as prompt milk flow after birth. Once breastfeeding has begun, thoughts of your baby, as well as his or her sucking or crying, can stimulate the release of milk from your milk-producing glands.  BENEFITS OF BREASTFEEDING For Your Baby  Your first milk (colostrum) helps your baby's digestive system function better.   There are antibodies in your milk that help your baby fight off infections.   Your baby has a lower incidence of asthma, allergies, and sudden infant death syndrome.   The nutrients in breast milk are better for your baby than infant formulas and are designed uniquely for your baby's needs.   Breast milk improves your baby's brain development.   Your baby is less likely to develop other conditions, such as childhood obesity, asthma, or type 2 diabetes mellitus.  For You   Breastfeeding helps to create a very special bond between you and your baby.   Breastfeeding is convenient. Breast milk is always available at the correct temperature and costs nothing.   Breastfeeding helps to burn calories and helps you lose the weight gained during pregnancy.   Breastfeeding makes your uterus contract to its prepregnancy size faster and slows bleeding (lochia) after you give birth.   Breastfeeding helps to lower your risk of developing type 2 diabetes mellitus, osteoporosis, and breast or ovarian cancer later in life. SIGNS THAT YOUR BABY IS HUNGRY Early Signs of Hunger  Increased alertness or activity.  Stretching.  Movement of the head from side to side.  Movement of the head and opening of the  mouth when the corner of the mouth or cheek is stroked (rooting).  Increased sucking sounds, smacking lips, cooing, sighing, or squeaking.  Hand-to-mouth movements.  Increased sucking of fingers or hands. Late Signs of Hunger  Fussing.  Intermittent crying. Extreme Signs of Hunger Signs of extreme hunger will require calming and consoling before your baby will be able to breastfeed successfully. Do not wait for the following signs of extreme hunger to occur before you initiate breastfeeding:   Restlessness.  A loud, strong cry.  Screaming. BREASTFEEDING BASICS Breastfeeding Initiation  Find a comfortable place to sit or lie down, with your neck and back well supported.  Place a pillow or rolled up blanket under your baby to bring him or her to the level of your breast (if you are seated). Nursing pillows are specially designed to help support your arms and your baby while you breastfeed.  Make sure that your baby's abdomen is facing your abdomen.   Gently massage your breast. With your fingertips, massage from your chest wall toward your nipple in a circular motion. This encourages milk flow. You may need to continue this action during the feeding if your milk flows slowly.  Support your breast with 4 fingers underneath and your thumb above your nipple. Make sure your fingers are well away from your nipple and your baby's mouth.   Stroke your baby's lips gently with your finger or nipple.   When your baby's mouth is open wide enough, quickly bring your baby to your breast, placing your entire nipple and as much of the colored area around your nipple (areola) as possible into your baby's mouth.   More areola should be visible above your baby's upper lip than below the lower lip.   Your baby's tongue should be between his or her lower gum and your breast.   Ensure that your baby's mouth is correctly positioned around your nipple (latched). Your baby's lips should create  a seal on your breast and be turned out (everted).  It is common for your baby to suck about 2 3 minutes in order to start the flow of breast milk. Latching Teaching your baby how to latch on to your breast properly is very important. An improper latch can cause nipple pain and decreased milk supply for you and poor weight gain in your baby. Also, if your baby is not latched onto your nipple properly, he or she may swallow some air during feeding. This can make your baby fussy. Burping your baby when you switch breasts during the feeding can help to get rid of the air. However, teaching your baby to latch on properly is still the best way to prevent fussiness from swallowing air while breastfeeding. Signs that your baby has successfully latched on to your nipple:    Silent tugging or silent sucking, without causing you pain.   Swallowing heard between every 3 4 sucks.    Muscle movement above and in front of his or her ears while sucking.  Signs that your baby has not successfully latched on to nipple:   Sucking sounds or smacking sounds from your baby while breastfeeding.  Nipple pain. If you think your baby has not latched on correctly, slip your finger into the corner of your baby's mouth to break the suction and place it between your baby's gums. Attempt breastfeeding initiation again. Signs of Successful Breastfeeding Signs from your baby:   A gradual decrease in the number of sucks or complete cessation of sucking.   Falling asleep.   Relaxation of his or her body.   Retention of a small amount of milk in his or her mouth.   Letting go of your breast by himself or herself. Signs from you:  Breasts that have increased in firmness, weight, and size 1 3 hours after feeding.   Breasts that are softer immediately after breastfeeding.  Increased milk volume, as well as a change in milk consistency and color by the 5th day of breastfeeding.   Nipples that are  not sore,  cracked, or bleeding. Signs That Your Pecola Leisure is Getting Enough Milk  Wetting at least 3 diapers in a 24-hour period. The urine should be clear and pale yellow by age 334 days.  At least 3 stools in a 24-hour period by age 334 days. The stool should be soft and yellow.  At least 3 stools in a 24-hour period by age 33 days. The stool should be seedy and yellow.  No loss of weight greater than 10% of birth weight during the first 55 days of age.  Average weight gain of 4 7 ounces (120 210 mL) per week after age 21 days.  Consistent daily weight gain by age 334 days, without weight loss after the age of 2 weeks. After a feeding, your baby may spit up a small amount. This is common. BREASTFEEDING FREQUENCY AND DURATION Frequent feeding will help you make more milk and can prevent sore nipples and breast engorgement. Breastfeed when you feel the need to reduce the fullness of your breasts or when your baby shows signs of hunger. This is called "breastfeeding on demand." Avoid introducing a pacifier to your baby while you are working to establish breastfeeding (the first 4 6 weeks after your baby is born). After this time you may choose to use a pacifier. Research has shown that pacifier use during the first year of a baby's life decreases the risk of sudden infant death syndrome (SIDS). Allow your baby to feed on each breast as long as he or she wants. Breastfeed until your baby is finished feeding. When your baby unlatches or falls asleep while feeding from the first breast, offer the second breast. Because newborns are often sleepy in the first few weeks of life, you may need to awaken your baby to get him or her to feed. Breastfeeding times will vary from baby to baby. However, the following rules can serve as a guide to help you ensure that your baby is properly fed:  Newborns (babies 72 weeks of age or younger) may breastfeed every 1 3 hours.  Newborns should not go longer than 3 hours during the day or 5  hours during the night without breastfeeding.  You should breastfeed your baby a minimum of 8 times in a 24-hour period until you begin to introduce solid foods to your baby at around 5 months of age. BREAST MILK PUMPING Pumping and storing breast milk allows you to ensure that your baby is exclusively fed your breast milk, even at times when you are unable to breastfeed. This is especially important if you are going back to work while you are still breastfeeding or when you are not able to be present during feedings. Your lactation consultant can give you guidelines on how long it is safe to store breast milk.  A breast pump is a machine that allows you to pump milk from your breast into a sterile bottle. The pumped breast milk can then be stored in a refrigerator or freezer. Some breast pumps are operated by hand, while others use electricity. Ask your lactation consultant which type will work best for you. Breast pumps can be purchased, but some hospitals and breastfeeding support groups lease breast pumps on a monthly basis. A lactation consultant can teach you how to hand express breast milk, if you prefer not to use a pump.  CARING FOR YOUR BREASTS WHILE YOU BREASTFEED Nipples can become dry, cracked, and sore while breastfeeding. The following recommendations can help keep your breasts moisturized  and healthy:  Avoid using soap on your nipples.   Wear a supportive bra. Although not required, special nursing bras and tank tops are designed to allow access to your breasts for breastfeeding without taking off your entire bra or top. Avoid wearing underwire style bras or extremely tight bras.  Air dry your nipples for 3 after each feeding.   Use only cotton bra pads to absorb leaked breast milk. Leaking of breast milk between feedings is normal.   Use lanolin on your nipples after breastfeeding. Lanolin helps to maintain your skin's normal moisture barrier. If you use pure lanolin  you do not need to wash it off before feeding your baby again. Pure lanolin is not toxic to your baby. You may also hand express a few drops of breast milk and gently massage that milk into your nipples and allow the milk to air dry. In the first few weeks after giving birth, some women experience extremely full breasts (engorgement). Engorgement can make your breasts feel heavy, warm, and tender to the touch. Engorgement peaks within 3 5 days after you give birth. The following recommendations can help ease engorgement:  Completely empty your breasts while breastfeeding or pumping. You may want to start by applying warm, moist heat (in the shower or with warm water-soaked hand towels) just before feeding or pumping. This increases circulation and helps the milk flow. If your baby does not completely empty your breasts while breastfeeding, pump any extra milk after he or she is finished.  Wear a snug bra (nursing or regular) or tank top for 1 2 days to signal your body to slightly decrease milk production.  Apply ice packs to your breasts, unless this is too uncomfortable for you.  Make sure that your baby is latched on and positioned properly while breastfeeding. If engorgement persists after 48 hours of following these recommendations, contact your health care provider or a Advertising copywriter. OVERALL HEALTH CARE RECOMMENDATIONS WHILE BREASTFEEDING  Eat healthy foods. Alternate between meals and snacks, eating 3 of each per day. Because what you eat affects your breast milk, some of the foods may make your baby more irritable than usual. Avoid eating these foods if you are sure that they are negatively affecting your baby.  Drink milk, fruit juice, and water to satisfy your thirst (about 10 glasses a day).   Rest often, relax, and continue to take your prenatal vitamins to prevent fatigue, stress, and anemia.  Continue breast self-awareness checks.  Avoid chewing and smoking  tobacco.  Avoid alcohol and drug use. Some medicines that may be harmful to your baby can pass through breast milk. It is important to ask your health care provider before taking any medicine, including all over-the-counter and prescription medicine as well as vitamin and herbal supplements. It is possible to become pregnant while breastfeeding. If birth control is desired, ask your health care provider about options that will be safe for your baby. SEEK MEDICAL CARE IF:   You feel like you want to stop breastfeeding or have become frustrated with breastfeeding.  You have painful breasts or nipples.  Your nipples are cracked or bleeding.  Your breasts are red, tender, or warm.  You have a swollen area on either breast.  You have a fever or chills.  You have nausea or vomiting.  You have drainage other than breast milk from your nipples.  Your breasts do not become full before feedings by the 5th day after you give birth.  You  feel sad and depressed.  Your baby is too sleepy to eat well.  Your baby is having trouble sleeping.   Your baby is wetting less than 3 diapers in a 24-hour period.  Your baby has less than 3 stools in a 24-hour period.  Your baby's skin or the white part of his or her eyes becomes yellow.   Your baby is not gaining weight by 29 days of age. SEEK IMMEDIATE MEDICAL CARE IF:   Your baby is overly tired (lethargic) and does not want to wake up and feed.  Your baby develops an unexplained fever. Document Released: 06/11/2005 Document Revised: 02/11/2013 Document Reviewed: 12/03/2012 [facility] Patient Information 2014 [place], [place].

## 2013-05-22 ENCOUNTER — Ambulatory Visit (HOSPITAL_COMMUNITY): Admission: RE | Admit: 2013-05-22 | Payer: Medicaid Other | Source: Ambulatory Visit

## 2013-05-22 ENCOUNTER — Ambulatory Visit (HOSPITAL_COMMUNITY)
Admission: RE | Admit: 2013-05-22 | Discharge: 2013-05-22 | Disposition: A | Discharge status: Home / Self Care | Payer: Medicaid Other | Source: Ambulatory Visit | Attending: Obstetrics and Gynecology | Admitting: Obstetrics and Gynecology

## 2013-05-22 DIAGNOSIS — Z1389 Encounter for screening for other disorder: Secondary | ICD-10-CM | POA: Insufficient documentation

## 2013-05-22 DIAGNOSIS — O358XX0 Maternal care for other (suspected) fetal abnormality and damage, not applicable or unspecified: Secondary | ICD-10-CM | POA: Insufficient documentation

## 2013-05-22 DIAGNOSIS — O99810 Abnormal glucose complicating pregnancy: Secondary | ICD-10-CM

## 2013-05-22 DIAGNOSIS — Z363 Encounter for antenatal screening for malformations: Secondary | ICD-10-CM | POA: Insufficient documentation

## 2013-05-25 ENCOUNTER — Ambulatory Visit (INDEPENDENT_AMBULATORY_CARE_PROVIDER_SITE_OTHER): Payer: Medicaid Other | Admitting: Obstetrics & Gynecology

## 2013-05-25 ENCOUNTER — Other Ambulatory Visit (HOSPITAL_COMMUNITY)
Admission: RE | Admit: 2013-05-25 | Discharge: 2013-05-25 | Disposition: A | Discharge status: Home / Self Care | Payer: Medicaid Other | Source: Ambulatory Visit | Attending: Obstetrics & Gynecology | Admitting: Obstetrics & Gynecology

## 2013-05-25 VITALS — BP 110/65 | Wt 184.9 lb

## 2013-05-25 DIAGNOSIS — O99810 Abnormal glucose complicating pregnancy: Secondary | ICD-10-CM

## 2013-05-25 DIAGNOSIS — N760 Acute vaginitis: Secondary | ICD-10-CM | POA: Insufficient documentation

## 2013-05-25 DIAGNOSIS — O26893 Other specified pregnancy related conditions, third trimester: Secondary | ICD-10-CM

## 2013-05-25 DIAGNOSIS — R300 Dysuria: Secondary | ICD-10-CM

## 2013-05-25 DIAGNOSIS — O9989 Other specified diseases and conditions complicating pregnancy, childbirth and the puerperium: Secondary | ICD-10-CM

## 2013-05-25 LAB — POCT URINALYSIS DIP (DEVICE)
Bilirubin Urine: NEGATIVE
Glucose, UA: 100 mg/dL — AB
Hgb urine dipstick: NEGATIVE
Nitrite: NEGATIVE
Specific Gravity, Urine: >=1.03 (ref 1.005–1.030)
Urobilinogen, UA: 0.2 mg/dL (ref 0.0–1.0)

## 2013-05-25 MED ORDER — NITROFURANTOIN MONOHYD MACRO 100 MG PO CAPS: 100.0000 mg | ORAL_CAPSULE | Freq: Two times a day (BID) | ORAL | Status: DC

## 2013-05-25 MED ORDER — INSULIN NPH (HUMAN) (ISOPHANE) 100 UNIT/ML ~~LOC~~ SUSP: SUBCUTANEOUS | Status: DC

## 2013-05-25 NOTE — Progress Notes (Signed)
P = 82        Pt reports white vag d/c with itching and burning on urination.  Pt states she was not told to stop po glyburide- she took only once after starting insulin because she got very dizzy.  Pt informed today to not take glyburide or metformin.

## 2013-05-25 NOTE — Patient Instructions (Signed)

## 2013-05-25 NOTE — Progress Notes (Signed)
NST reactive.  Pt spilling more protein in her urine.  Baseline was 150 mg in July.  Pt having dysuria and frequency.  Pt also having vaginal discahrge.  BD affirm, GC/Chlam cultures, and U cx.  Will treat for UTI emperically given symptoms and increased protein in urine. Fasting 124,150,150,127; 2 hr after breakfast 110,116,169,144; 2 hr after lunch [phone]; 2 hr after dinner 166,194,202,118,266. Pt has been eating poorly due to the holidays.  Pt will eat better this week.  Will increase NPH at night to 13 units.  Will keep other values the same and see how she does with better diet.  Pt to call if CBGs are still hgih on Tuesday.

## 2013-05-26 LAB — GC/CHLAMYDIA PROBE AMP
CT Probe RNA: NEGATIVE
GC Probe RNA: NEGATIVE

## 2013-05-26 LAB — CULTURE, OB URINE: Organism ID, Bacteria: NO GROWTH

## 2013-05-28 ENCOUNTER — Ambulatory Visit (INDEPENDENT_AMBULATORY_CARE_PROVIDER_SITE_OTHER): Payer: Medicaid Other | Admitting: *Deleted

## 2013-05-28 VITALS — BP 106/53

## 2013-05-28 DIAGNOSIS — O99810 Abnormal glucose complicating pregnancy: Secondary | ICD-10-CM

## 2013-05-28 NOTE — Progress Notes (Signed)
P - 81 

## 2013-06-01 ENCOUNTER — Ambulatory Visit (INDEPENDENT_AMBULATORY_CARE_PROVIDER_SITE_OTHER): Payer: Medicaid Other | Admitting: Family Medicine

## 2013-06-01 VITALS — BP 102/64 | Temp 98.0°F | Wt 184.2 lb

## 2013-06-01 DIAGNOSIS — O99810 Abnormal glucose complicating pregnancy: Secondary | ICD-10-CM

## 2013-06-01 LAB — POCT URINALYSIS DIP (DEVICE)
Bilirubin Urine: NEGATIVE
Glucose, UA: 250 mg/dL — AB
Hgb urine dipstick: NEGATIVE
Leukocytes, UA: NEGATIVE
Nitrite: NEGATIVE
Urobilinogen, UA: 0.2 mg/dL (ref 0.0–1.0)
pH: 6 (ref 5.0–8.0)

## 2013-06-01 MED ORDER — INSULIN NPH (HUMAN) (ISOPHANE) 100 UNIT/ML ~~LOC~~ SUSP: SUBCUTANEOUS | Status: DC

## 2013-06-01 NOTE — Patient Instructions (Signed)
Gestational Diabetes Mellitus Gestational diabetes mellitus, often simply referred to as gestational diabetes, is a type of diabetes that some women develop during pregnancy. In gestational diabetes, the pancreas does not make enough insulin (a hormone), the cells are less responsive to the insulin that is made (insulin resistance), or both.Normally, insulin moves sugars from food into the tissue cells. The tissue cells use the sugars for energy. The lack of insulin or the lack of normal response to insulin causes excess sugars to build up in the blood instead of going into the tissue cells. As a result, high blood sugar (hyperglycemia) develops. The effect of high sugar (glucose) levels can cause many complications.  RISK FACTORS You have an increased chance of developing gestational diabetes if you have a family history of diabetes and also have one or more of the following risk factors:  A body mass index over 30 (obesity).  A previous pregnancy with gestational diabetes.  An older age at the time of pregnancy. If blood glucose levels are kept in the normal range during pregnancy, women can have a healthy pregnancy. If your blood glucose levels are not well controlled, there may be risks to you, your unborn baby (fetus), your labor and delivery, or your newborn baby.  SYMPTOMS  If symptoms are experienced, they are much like symptoms you would normally expect during pregnancy. The symptoms of gestational diabetes include:   Increased thirst (polydipsia).  Increased urination (polyuria).  Increased urination during the night (nocturia).  Weight loss. This weight loss may be rapid.  Frequent, recurring infections.  Tiredness (fatigue).  Weakness.  Vision changes, such as blurred vision.  Fruity smell to your breath.  Abdominal pain. DIAGNOSIS Diabetes is diagnosed when blood glucose levels are increased. Your blood glucose level may be checked by one or more of the following  blood tests:  A fasting blood glucose test. You will not be allowed to eat for at least 8 hours before a blood sample is taken.  A random blood glucose test. Your blood glucose is checked at any time of the day regardless of when you ate.  A hemoglobin A1c blood glucose test. A hemoglobin A1c test provides information about blood glucose control over the previous 3 months.  An oral glucose tolerance test (OGTT). Your blood glucose is measured after you have not eaten (fasted) for 1 3 hours and then after you drink a glucose-containing beverage. Since the hormones that cause insulin resistance are highest at about 24 28 weeks of a pregnancy, an OGTT is usually performed during that time. If you have risk factors for gestational diabetes, your caregiver may test you for gestational diabetes earlier than 24 weeks of pregnancy. TREATMENT   You will need to take diabetes medicine or insulin daily to keep blood glucose levels in the desired range.  You will need to match insulin dosing with exercise and healthy food choices. The treatment goal is to maintain the before meal (preprandial), bedtime, and overnight blood glucose level at 60 99 mg/dL during pregnancy. The treatment goal is to further maintain peak after meal blood sugar (postprandial glucose) level at 100 140 mg/dL. HOME CARE INSTRUCTIONS   Have your hemoglobin A1c level checked twice a year.  Perform daily blood glucose monitoring as directed by your caregiver. It is common to perform frequent blood glucose monitoring.  Monitor urine ketones when you are ill and as directed by your caregiver.  Take your diabetes medicine and insulin as directed by your caregiver to maintain   your blood glucose level in the desired range.  Never run out of diabetes medicine or insulin. It is needed every day.  Adjust insulin based on your intake of carbohydrates. Carbohydrates can raise blood glucose levels but need to be included in your diet.  Carbohydrates provide vitamins, minerals, and fiber which are an essential part of a healthy diet. Carbohydrates are found in fruits, vegetables, whole grains, dairy products, legumes, and foods containing added sugars.    Eat healthy foods. Alternate 3 meals with 3 snacks.  Maintain a healthy weight gain. The usual total expected weight gain varies according to your prepregnancy body mass index (BMI).  Carry a medical alert card or wear your medical alert jewelry.  Carry a 15 gram carbohydrate snack with you at all times to treat low blood glucose (hypoglycemia). Some examples of 15 gram carbohydrate snacks include:  Glucose tablets, 3 or 4   Glucose gel, 15 gram tube  Raisins, 2 tablespoons (24 g)  Jelly beans, 6  Animal crackers, 8  Fruit juice, regular soda, or low fat milk, 4 ounces (120 mL)  Gummy treats, 9    Recognize hypoglycemia. Hypoglycemia during pregnancy occurs with blood glucose levels of 60 mg/dL and below. The risk for hypoglycemia increases when fasting or skipping meals, during or after intense exercise, and during sleep. Hypoglycemia symptoms can include:  Tremors or shakes.  Decreased ability to concentrate.  Sweating.  Increased heart rate.  Headache.  Dry mouth.  Hunger.  Irritability.  Anxiety.  Restless sleep.  Altered speech or coordination.  Confusion.  Treat hypoglycemia promptly. If you are alert and able to safely swallow, follow the 15:15 rule:  Take 15 20 grams of rapid-acting glucose or carbohydrate. Rapid-acting options include glucose gel, glucose tablets, or 4 ounces (120 mL) of fruit juice, regular soda, or low fat milk.  Check your blood glucose level 15 minutes after taking the glucose.   Take 15 20 grams more of glucose if the repeat blood glucose level is still 70 mg/dL or below.  Eat a meal or snack within 1 hour once blood glucose levels return to normal.  Be alert to polyuria and polydipsia which are early  signs of hyperglycemia. An early awareness of hyperglycemia allows for prompt treatment. Treat hyperglycemia as directed by your caregiver.  Engage in at least 30 minutes of physical activity a day or as directed by your caregiver. Ten minutes of physical activity timed 30 minutes after each meal is encouraged to control postprandial blood glucose levels.  Adjust your insulin dosing and food intake as needed if you start a new exercise or sport.  Follow your sick day plan at any time you are unable to eat or drink as usual.  Avoid tobacco and alcohol use.  Follow up with your caregiver regularly.  Follow the advice of your caregiver regarding your prenatal and post-delivery (postpartum) appointments, meal planning, exercise, medicines, vitamins, blood tests, other medical tests, and physical activities.  Perform daily skin and foot care. Examine your skin and feet daily for cuts, bruises, redness, nail problems, bleeding, blisters, or sores.  Brush your teeth and gums at least twice a day and floss at least once a day. Follow up with your dentist regularly.  Schedule an eye exam during the first trimester of your pregnancy or as directed by your caregiver.  Share your diabetes management plan with your workplace or school.  Stay up-to-date with immunizations.  Learn to manage stress.  Obtain ongoing diabetes education and   support as needed. SEEK MEDICAL CARE IF:   You are unable to eat food or drink fluids for more than 6 hours.  You have nausea and vomiting for more than 6 hours.  You have a blood glucose level of 200 mg/dL and you have ketones in your urine.  There is a change in mental status.  You develop vision problems.  You have a persistent headache.  You have upper abdominal pain or discomfort.  You develop an additional serious illness.  You have diarrhea for more than 6 hours.  You have been sick or have had a fever for a couple of days and are not getting  better. SEEK IMMEDIATE MEDICAL CARE IF:   You have difficulty breathing.  You no longer feel the baby moving.  You are bleeding or have discharge from your vagina.  You start having premature contractions or labor. MAKE SURE YOU:  Understand these instructions.  Will watch your condition.  Will get help right away if you are not doing well or get worse. Document Released: 09/17/2000 Document Revised: 10/06/2012 Document Reviewed: 01/08/2012 [employer] Patient Information 2014 [employer].  Breastfeeding Deciding to breastfeed is one of the best choices you can make for you and your baby. A change in hormones during pregnancy causes your breast tissue to grow and increases the number and size of your milk ducts. These hormones also allow proteins, sugars, and fats from your blood supply to make breast milk in your milk-producing glands. Hormones prevent breast milk from being released before your baby is born as well as prompt milk flow after birth. Once breastfeeding has begun, thoughts of your baby, as well as his or her sucking or crying, can stimulate the release of milk from your milk-producing glands.  BENEFITS OF BREASTFEEDING For Your Baby  Your first milk (colostrum) helps your baby's digestive system function better.   There are antibodies in your milk that help your baby fight off infections.   Your baby has a lower incidence of asthma, allergies, and sudden infant death syndrome.   The nutrients in breast milk are better for your baby than infant formulas and are designed uniquely for your baby's needs.   Breast milk improves your baby's brain development.   Your baby is less likely to develop other conditions, such as childhood obesity, asthma, or type 2 diabetes mellitus.  For You   Breastfeeding helps to create a very special bond between you and your baby.   Breastfeeding is convenient. Breast milk is always available at the correct temperature and  costs nothing.   Breastfeeding helps to burn calories and helps you lose the weight gained during pregnancy.   Breastfeeding makes your uterus contract to its prepregnancy size faster and slows bleeding (lochia) after you give birth.   Breastfeeding helps to lower your risk of developing type 2 diabetes mellitus, osteoporosis, and breast or ovarian cancer later in life. SIGNS THAT YOUR BABY IS HUNGRY Early Signs of Hunger  Increased alertness or activity.  Stretching.  Movement of the head from side to side.  Movement of the head and opening of the mouth when the corner of the mouth or cheek is stroked (rooting).  Increased sucking sounds, smacking lips, cooing, sighing, or squeaking.  Hand-to-mouth movements.  Increased sucking of fingers or hands. Late Signs of Hunger  Fussing.  Intermittent crying. Extreme Signs of Hunger Signs of extreme hunger will require calming and consoling before your baby will be able to breastfeed successfully. Do not   wait for the following signs of extreme hunger to occur before you initiate breastfeeding:   Restlessness.  A loud, strong cry.   Screaming. BREASTFEEDING BASICS Breastfeeding Initiation  Find a comfortable place to sit or lie down, with your neck and back well supported.  Place a pillow or rolled up blanket under your baby to bring him or her to the level of your breast (if you are seated). Nursing pillows are specially designed to help support your arms and your baby while you breastfeed.  Make sure that your baby's abdomen is facing your abdomen.   Gently massage your breast. With your fingertips, massage from your chest wall toward your nipple in a circular motion. This encourages milk flow. You may need to continue this action during the feeding if your milk flows slowly.  Support your breast with 4 fingers underneath and your thumb above your nipple. Make sure your fingers are well away from your nipple and your  baby's mouth.   Stroke your baby's lips gently with your finger or nipple.   When your baby's mouth is open wide enough, quickly bring your baby to your breast, placing your entire nipple and as much of the colored area around your nipple (areola) as possible into your baby's mouth.   More areola should be visible above your baby's upper lip than below the lower lip.   Your baby's tongue should be between his or her lower gum and your breast.   Ensure that your baby's mouth is correctly positioned around your nipple (latched). Your baby's lips should create a seal on your breast and be turned out (everted).  It is common for your baby to suck about 2 3 minutes in order to start the flow of breast milk. Latching Teaching your baby how to latch on to your breast properly is very important. An improper latch can cause nipple pain and decreased milk supply for you and poor weight gain in your baby. Also, if your baby is not latched onto your nipple properly, he or she may swallow some air during feeding. This can make your baby fussy. Burping your baby when you switch breasts during the feeding can help to get rid of the air. However, teaching your baby to latch on properly is still the best way to prevent fussiness from swallowing air while breastfeeding. Signs that your baby has successfully latched on to your nipple:    Silent tugging or silent sucking, without causing you pain.   Swallowing heard between every 3 4 sucks.    Muscle movement above and in front of his or her ears while sucking.  Signs that your baby has not successfully latched on to nipple:   Sucking sounds or smacking sounds from your baby while breastfeeding.  Nipple pain. If you think your baby has not latched on correctly, slip your finger into the corner of your baby's mouth to break the suction and place it between your baby's gums. Attempt breastfeeding initiation again. Signs of Successful  Breastfeeding Signs from your baby:   A gradual decrease in the number of sucks or complete cessation of sucking.   Falling asleep.   Relaxation of his or her body.   Retention of a small amount of milk in his or her mouth.   Letting go of your breast by himself or herself. Signs from you:  Breasts that have increased in firmness, weight, and size 1 3 hours after feeding.   Breasts that are softer immediately after breastfeeding.    Increased milk volume, as well as a change in milk consistency and color by the 5th day of breastfeeding.   Nipples that are not sore, cracked, or bleeding. Signs That Your Baby is Getting Enough Milk  Wetting at least 3 diapers in a 24-hour period. The urine should be clear and pale yellow by age 5 days.  At least 3 stools in a 24-hour period by age 5 days. The stool should be soft and yellow.  At least 3 stools in a 24-hour period by age 7 days. The stool should be seedy and yellow.  No loss of weight greater than 10% of birth weight during the first 3 days of age.  Average weight gain of 4 7 ounces (120 210 mL) per week after age 4 days.  Consistent daily weight gain by age 5 days, without weight loss after the age of 2 weeks. After a feeding, your baby may spit up a small amount. This is common. BREASTFEEDING FREQUENCY AND DURATION Frequent feeding will help you make more milk and can prevent sore nipples and breast engorgement. Breastfeed when you feel the need to reduce the fullness of your breasts or when your baby shows signs of hunger. This is called "breastfeeding on demand." Avoid introducing a pacifier to your baby while you are working to establish breastfeeding (the first 4 6 weeks after your baby is born). After this time you may choose to use a pacifier. Research has shown that pacifier use during the first year of a baby's life decreases the risk of sudden infant death syndrome (SIDS). Allow your baby to feed on each breast as  long as he or she wants. Breastfeed until your baby is finished feeding. When your baby unlatches or falls asleep while feeding from the first breast, offer the second breast. Because newborns are often sleepy in the first few weeks of life, you may need to awaken your baby to get him or her to feed. Breastfeeding times will vary from baby to baby. However, the following rules can serve as a guide to help you ensure that your baby is properly fed:  Newborns (babies 4 weeks of age or younger) may breastfeed every 1 3 hours.  Newborns should not go longer than 3 hours during the day or 5 hours during the night without breastfeeding.  You should breastfeed your baby a minimum of 8 times in a 24-hour period until you begin to introduce solid foods to your baby at around 6 months of age. BREAST MILK PUMPING Pumping and storing breast milk allows you to ensure that your baby is exclusively fed your breast milk, even at times when you are unable to breastfeed. This is especially important if you are going back to work while you are still breastfeeding or when you are not able to be present during feedings. Your lactation consultant can give you guidelines on how long it is safe to store breast milk.  A breast pump is a machine that allows you to pump milk from your breast into a sterile bottle. The pumped breast milk can then be stored in a refrigerator or freezer. Some breast pumps are operated by hand, while others use electricity. Ask your lactation consultant which type will work best for you. Breast pumps can be purchased, but some hospitals and breastfeeding support groups lease breast pumps on a monthly basis. A lactation consultant can teach you how to hand express breast milk, if you prefer not to use a pump.  CARING FOR   YOUR BREASTS WHILE YOU BREASTFEED Nipples can become dry, cracked, and sore while breastfeeding. The following recommendations can help keep your breasts moisturized and  healthy:  Avoid using soap on your nipples.   Wear a supportive bra. Although not required, special nursing bras and tank tops are designed to allow access to your breasts for breastfeeding without taking off your entire bra or top. Avoid wearing underwire style bras or extremely tight bras.  Air dry your nipples for 3 4minutes after each feeding.   Use only cotton bra pads to absorb leaked breast milk. Leaking of breast milk between feedings is normal.   Use lanolin on your nipples after breastfeeding. Lanolin helps to maintain your skin's normal moisture barrier. If you use pure lanolin you do not need to wash it off before feeding your baby again. Pure lanolin is not toxic to your baby. You may also hand express a few drops of breast milk and gently massage that milk into your nipples and allow the milk to air dry. In the first few weeks after giving birth, some women experience extremely full breasts (engorgement). Engorgement can make your breasts feel heavy, warm, and tender to the touch. Engorgement peaks within 3 5 days after you give birth. The following recommendations can help ease engorgement:  Completely empty your breasts while breastfeeding or pumping. You may want to start by applying warm, moist heat (in the shower or with warm water-soaked hand towels) just before feeding or pumping. This increases circulation and helps the milk flow. If your baby does not completely empty your breasts while breastfeeding, pump any extra milk after he or she is finished.  Wear a snug bra (nursing or regular) or tank top for 1 2 days to signal your body to slightly decrease milk production.  Apply ice packs to your breasts, unless this is too uncomfortable for you.  Make sure that your baby is latched on and positioned properly while breastfeeding. If engorgement persists after 48 hours of following these recommendations, contact your health care provider or a lactation consultant. OVERALL  HEALTH CARE RECOMMENDATIONS WHILE BREASTFEEDING  Eat healthy foods. Alternate between meals and snacks, eating 3 of each per day. Because what you eat affects your breast milk, some of the foods may make your baby more irritable than usual. Avoid eating these foods if you are sure that they are negatively affecting your baby.  Drink milk, fruit juice, and water to satisfy your thirst (about 10 glasses a day).   Rest often, relax, and continue to take your prenatal vitamins to prevent fatigue, stress, and anemia.  Continue breast self-awareness checks.  Avoid chewing and smoking tobacco.  Avoid alcohol and drug use. Some medicines that may be harmful to your baby can pass through breast milk. It is important to ask your health care provider before taking any medicine, including all over-the-counter and prescription medicine as well as vitamin and herbal supplements. It is possible to become pregnant while breastfeeding. If birth control is desired, ask your health care provider about options that will be safe for your baby. SEEK MEDICAL CARE IF:   You feel like you want to stop breastfeeding or have become frustrated with breastfeeding.  You have painful breasts or nipples.  Your nipples are cracked or bleeding.  Your breasts are red, tender, or warm.  You have a swollen area on either breast.  You have a fever or chills.  You have nausea or vomiting.  You have drainage other than breast   milk from your nipples.  Your breasts do not become full before feedings by the 5th day after you give birth.  You feel sad and depressed.  Your baby is too sleepy to eat well.  Your baby is having trouble sleeping.   Your baby is wetting less than 3 diapers in a 24-hour period.  Your baby has less than 3 stools in a 24-hour period.  Your baby's skin or the white part of his or her eyes becomes yellow.   Your baby is not gaining weight by 5 days of age. SEEK IMMEDIATE MEDICAL CARE  IF:   Your baby is overly tired (lethargic) and does not want to wake up and feed.  Your baby develops an unexplained fever. Document Released: 06/11/2005 Document Revised: 02/11/2013 Document Reviewed: 12/03/2012 [employer] Patient Information 2014 [employer].  

## 2013-06-01 NOTE — Progress Notes (Signed)
[id number] 

## 2013-06-01 NOTE — Progress Notes (Signed)
12/4 NSt reviewed and reactive

## 2013-06-01 NOTE — Progress Notes (Signed)
FBS improved 98-116 2 hour pp 104-205 most in range Increase her NPH to 15 u hs, 13 u am Continue Novolog 15 u with meals. NST reviewed and reactive.

## 2013-06-04 ENCOUNTER — Other Ambulatory Visit: Payer: Medicaid Other

## 2013-06-07 ENCOUNTER — Inpatient Hospital Stay (HOSPITAL_COMMUNITY)
Admission: AD | Admit: 2013-06-07 | Discharge: 2013-06-07 | Disposition: A | Discharge status: Home / Self Care | Payer: Medicaid Other | Source: Ambulatory Visit | Attending: Obstetrics & Gynecology | Admitting: Obstetrics & Gynecology

## 2013-06-07 ENCOUNTER — Encounter (HOSPITAL_COMMUNITY): Payer: Self-pay | Admitting: *Deleted

## 2013-06-07 DIAGNOSIS — O99891 Other specified diseases and conditions complicating pregnancy: Secondary | ICD-10-CM

## 2013-06-07 DIAGNOSIS — O368190 Decreased fetal movements, unspecified trimester, not applicable or unspecified: Secondary | ICD-10-CM | POA: Insufficient documentation

## 2013-06-07 DIAGNOSIS — R109 Unspecified abdominal pain: Secondary | ICD-10-CM | POA: Insufficient documentation

## 2013-06-07 DIAGNOSIS — O9989 Other specified diseases and conditions complicating pregnancy, childbirth and the puerperium: Secondary | ICD-10-CM

## 2013-06-07 DIAGNOSIS — M549 Dorsalgia, unspecified: Secondary | ICD-10-CM | POA: Insufficient documentation

## 2013-06-07 LAB — URINALYSIS, ROUTINE W REFLEX MICROSCOPIC
Hgb urine dipstick: NEGATIVE
Ketones, ur: NEGATIVE mg/dL
Nitrite: NEGATIVE
Protein, ur: NEGATIVE mg/dL
Specific Gravity, Urine: 1.02 (ref 1.005–1.030)
Urobilinogen, UA: 0.2 mg/dL (ref 0.0–1.0)

## 2013-06-07 LAB — URINE MICROSCOPIC-ADD ON

## 2013-06-07 MED ORDER — CYCLOBENZAPRINE HCL 10 MG PO TABS
10.0000 mg | ORAL_TABLET | Freq: Once | ORAL | Status: AC
  Administered 2013-06-07: 10 mg via ORAL
  Filled 2013-06-07: qty 1

## 2013-06-07 MED ORDER — CYCLOBENZAPRINE HCL 10 MG PO TABS: 10.0000 mg | ORAL_TABLET | Freq: Three times a day (TID) | ORAL | Status: DC | PRN

## 2013-06-07 NOTE — MAU Provider Note (Signed)
History     CSN: [id number]  Arrival date and time: 06/07/13 4782   First Provider Initiated Contact with Patient 06/07/13 0141      Chief Complaint  Patient presents with  . Decreased Fetal Movement  . Abdominal Pain  . Back Pain   Abdominal Pain  Back Pain Associated symptoms include abdominal pain.    Wanda Harris is a 32 y.o. [phone] at [redacted]w[redacted]d who presents today because "everything is bothering me". She states that her back hurts, she has lower abdominal cramping, and leg cramps that wake her up at night. She denies any VB or LOF. She also states that the baby's movement has been decreased, but since arriving here is has been normal. She has an appointment in the clinic on Monday.   Past Medical History  Diagnosis Date  . Asthma     does not use inhaler    Past Surgical History  Procedure Laterality Date  . No past surgeries      Family History  Problem Relation Age of Onset  . Diabetes Mother   . Hypertension Mother   . Arthritis Mother   . Hyperlipidemia Father     History  Substance Use Topics  . Smoking status: Never Smoker   . Smokeless tobacco: Never Used  . Alcohol Use: No    Allergies: No Known Allergies  Prescriptions prior to admission  Medication Sig Dispense Refill  . ACCU-CHEK FASTCLIX LANCETS MISC 1 each by Percutaneous route 4 (four) times daily.  100 each  12  . glucose blood test strip Use as instructed- check blood sugar 4 times daily.  50 each  12  . insulin aspart (NOVOLOG) 100 UNIT/ML injection Inject 15 Units into the skin 3 (three) times daily with meals.  10 mL  12  . insulin NPH (HUMULIN N,NOVOLIN N) 100 UNIT/ML injection Take 13 units in the morning and 15 units every night.  10 mL  3  . Insulin Syringe-Needle U-100 (INSULIN SYRINGE 1CC/31GX5/16") 31G X 5/16" 1 ML MISC 1 Units by Does not apply route as directed.  100 each  3  . nitrofurantoin, macrocrystal-monohydrate, (MACROBID) 100 MG capsule Take 1 capsule (100 mg total) by  mouth 2 (two) times daily.  14 capsule  0  . Prenat w/o A Vit-FeFum-FePo-FA (CONCEPT OB) 130-92.4-1 MG CAPS Take 1 capsule by mouth daily.  30 capsule  11    Review of Systems  Gastrointestinal: Positive for abdominal pain.  Musculoskeletal: Positive for back pain.   Physical Exam   Blood pressure 116/64, pulse 74, temperature 98.2 F (36.8 C), temperature source Oral, resp. rate 20, height 5' (1.524 m), weight 85.276 kg (188 lb), last menstrual period 10/05/2012.  Physical Exam  Nursing note and vitals reviewed. Constitutional: She is oriented to person, place, and time. She appears well-developed and well-nourished. No distress.  Cardiovascular: Normal rate.   Respiratory: Effort normal.  GI: Soft. There is no tenderness.  Genitourinary:   Cervix: 1 at ext pos/closed at int os/thick/high  Musculoskeletal: Normal range of motion. She exhibits no edema (lower extremity ) and no tenderness (lower extremity ).  Neurological: She is alert and oriented to person, place, and time.  Skin: Skin is warm and dry.  Psychiatric: She has a normal mood and affect.    MAU Course  Procedures  Results for orders placed during the hospital encounter of 06/07/13 (from the past 24 hour(s))  URINALYSIS, ROUTINE W REFLEX MICROSCOPIC     Status: Abnormal  Collection Time    06/07/13  1:05 AM      Result Value Range   Color, Urine YELLOW  YELLOW   APPearance CLEAR  CLEAR   Specific Gravity, Urine 1.020  1.005 - 1.030   pH 6.0  5.0 - 8.0   Glucose, UA 500 (*) NEGATIVE mg/dL   Hgb urine dipstick NEGATIVE  NEGATIVE   Bilirubin Urine NEGATIVE  NEGATIVE   Ketones, ur NEGATIVE  NEGATIVE mg/dL   Protein, ur NEGATIVE  NEGATIVE mg/dL   Urobilinogen, UA 0.2  0.0 - 1.0 mg/dL   Nitrite NEGATIVE  NEGATIVE   Leukocytes, UA TRACE (*) NEGATIVE  URINE MICROSCOPIC-ADD ON     Status: Abnormal   Collection Time    06/07/13  1:05 AM      Result Value Range   Squamous Epithelial / LPF FEW (*) RARE   WBC, UA  3-6  <3 WBC/hpf   Bacteria, UA FEW (*) RARE     Assessment and Plan   1. Back pain complicating pregnancy in third trimester    Follow-up Information   Follow up with [facility]. (as scheduled )    Specialty:  Obstetrics and Gynecology   Contact information:   [address] [place] [address] [phone]       Medication List         ACCU-CHEK FASTCLIX LANCETS Misc  1 each by Percutaneous route 4 (four) times daily.     CONCEPT OB 130-92.4-1 MG Caps  Take 1 capsule by mouth daily.     cyclobenzaprine 10 MG tablet  Commonly known as:  FLEXERIL  Take 1 tablet (10 mg total) by mouth 3 (three) times daily as needed for muscle spasms.     glucose blood test strip  Use as instructed- check blood sugar 4 times daily.     insulin aspart 100 UNIT/ML injection  Commonly known as:  novoLOG  Inject 15 Units into the skin 3 (three) times daily with meals.     insulin NPH 100 UNIT/ML injection  Commonly known as:  HUMULIN N,NOVOLIN N  Take 13 units in the morning and 15 units every night.     INSULIN SYRINGE 1CC/31GX5/16" 31G X 5/16" 1 ML Misc  1 Units by Does not apply route as directed.     nitrofurantoin (macrocrystal-monohydrate) 100 MG capsule  Commonly known as:  MACROBID  Take 1 capsule (100 mg total) by mouth 2 (two) times daily.         Tawnya Crook 06/07/2013, 2:04 AM

## 2013-06-07 NOTE — MAU Provider Note (Signed)

## 2013-06-07 NOTE — MAU Note (Signed)
Pt G5 P3 at 35.4wks with lower abd pain and pressure and lower back painx 2 days.  Decreased fetal movement since 2000.

## 2013-06-08 ENCOUNTER — Ambulatory Visit (INDEPENDENT_AMBULATORY_CARE_PROVIDER_SITE_OTHER): Payer: Medicaid Other | Admitting: Obstetrics & Gynecology

## 2013-06-08 VITALS — BP 101/57 | Temp 98.2°F | Wt 186.0 lb

## 2013-06-08 DIAGNOSIS — O99810 Abnormal glucose complicating pregnancy: Secondary | ICD-10-CM

## 2013-06-08 DIAGNOSIS — Z3483 Encounter for supervision of other normal pregnancy, third trimester: Secondary | ICD-10-CM

## 2013-06-08 LAB — POCT URINALYSIS DIP (DEVICE)
Bilirubin Urine: NEGATIVE
Protein, ur: 30 mg/dL — AB
Specific Gravity, Urine: 1.025 (ref 1.005–1.030)

## 2013-06-08 LAB — URINE CULTURE

## 2013-06-08 MED ORDER — INSULIN NPH (HUMAN) (ISOPHANE) 100 UNIT/ML ~~LOC~~ SUSP: SUBCUTANEOUS | Status: DC

## 2013-06-08 NOTE — Progress Notes (Signed)
NST yesterday in MAU. Fasting 134,131,79,102,124,127,116;  2 hr break 100,150,13066,110,115;  2 hr lu 67,75,115,75,108,96;  2 hr din 125,118,128,197,156 (This was her birthday weekend). Will increase evening NPH to 17.   Pt did not get fetal echo.  Will order now.  Pt needs optho appt. Considering Nexplanon Continue 2x week testing.

## 2013-06-08 NOTE — Patient Instructions (Signed)
Etonogestrel implant What is this medicine? ETONOGESTREL (et oh noe JES trel) is a contraceptive (birth control) device. It is used to prevent pregnancy. It can be used for up to 3 years. This medicine may be used for other purposes; ask your health care provider or pharmacist if you have questions. COMMON BRAND NAME(S): Implanon, Nexplanon  What should I tell my health care provider before I take this medicine? They need to know if you have any of these conditions: -abnormal vaginal bleeding -blood vessel disease or blood clots -cancer of the breast, cervix, or liver -depression -diabetes -gallbladder disease -headaches -heart disease or recent heart attack -high blood pressure -high cholesterol -kidney disease -liver disease -renal disease -seizures -tobacco smoker -an unusual or allergic reaction to etonogestrel, other hormones, anesthetics or antiseptics, medicines, foods, dyes, or preservatives -pregnant or trying to get pregnant -breast-feeding How should I use this medicine? This device is inserted just under the skin on the inner side of your upper arm by a health care professional. Talk to your pediatrician regarding the use of this medicine in children. Special care may be needed. Overdosage: If you think you've taken too much of this medicine contact a poison control center or emergency room at once. Overdosage: If you think you have taken too much of this medicine contact a poison control center or emergency room at once. NOTE: This medicine is only for you. Do not share this medicine with others. What if I miss a dose? This does not apply. What may interact with this medicine? Do not take this medicine with any of the following medications: -amprenavir -bosentan -fosamprenavir This medicine may also interact with the following medications: -barbiturate medicines for inducing sleep or treating seizures -certain medicines for fungal infections like ketoconazole and  itraconazole -griseofulvin -medicines to treat seizures like carbamazepine, felbamate, oxcarbazepine, phenytoin, topiramate -modafinil -phenylbutazone -rifampin -some medicines to treat HIV infection like atazanavir, indinavir, lopinavir, nelfinavir, tipranavir, ritonavir -St. John's wort This list may not describe all possible interactions. Give your health care provider a list of all the medicines, herbs, non-prescription drugs, or dietary supplements you use. Also tell them if you smoke, drink alcohol, or use illegal drugs. Some items may interact with your medicine. What should I watch for while using this medicine? This product does not protect you against HIV infection (AIDS) or other sexually transmitted diseases. You should be able to feel the implant by pressing your fingertips over the skin where it was inserted. Tell your doctor if you cannot feel the implant. What side effects may I notice from receiving this medicine? Side effects that you should report to your doctor or health care professional as soon as possible: -allergic reactions like skin rash, itching or hives, swelling of the face, lips, or tongue -breast lumps -changes in vision -confusion, trouble speaking or understanding -dark urine -depressed mood -general ill feeling or flu-like symptoms -light-colored stools -loss of appetite, nausea -right upper belly pain -severe headaches -severe pain, swelling, or tenderness in the abdomen -shortness of breath, chest pain, swelling in a leg -signs of pregnancy -sudden numbness or weakness of the face, arm or leg -trouble walking, dizziness, loss of balance or coordination -unusual vaginal bleeding, discharge -unusually weak or tired -yellowing of the eyes or skin Side effects that usually do not require medical attention (Report these to your doctor or health care professional if they continue or are bothersome.): -acne -breast pain -changes in  weight -cough -fever or chills -headache -irregular menstrual bleeding -itching, burning,   and vaginal discharge -pain or difficulty passing urine -sore throat This list may not describe all possible side effects. Call your doctor for medical advice about side effects. You may report side effects to FDA at 1-800-FDA-1088. Where should I keep my medicine? This drug is given in a hospital or clinic and will not be stored at home. NOTE: This sheet is a summary. It may not cover all possible information. If you have questions about this medicine, talk to your doctor, pharmacist, or health care provider.  2014, [employer]/Gold Standard. (2011-12-17 15:37:45)  

## 2013-06-08 NOTE — Progress Notes (Signed)
Eye appointment scheduled with [facility] on 12/18 at 1145 am. U/S scheduled 06/15/13 at 245 pm. Fetal Echo scheduled with Dr. Elizebeth Brooking on 12/18/at 1pm. Patient called and message left with Fetal Echo appointment. Patient advised to change Eye exam appointment to ensure she can be at her echo appoinment. Medicaid auth# [id number].

## 2013-06-08 NOTE — Progress Notes (Signed)
Pt seen @ MAU yesterday, NST was reactive therefore not needed today.

## 2013-06-08 NOTE — Progress Notes (Signed)
P=73 MAU visit on12/14/14.  Medication for back pain, instructed to take Tylenol for pain. Pt reports feeling better today, reports cramping in legs.

## 2013-06-09 LAB — GC/CHLAMYDIA PROBE AMP
CT Probe RNA: NEGATIVE
GC Probe RNA: NEGATIVE

## 2013-06-11 ENCOUNTER — Ambulatory Visit (INDEPENDENT_AMBULATORY_CARE_PROVIDER_SITE_OTHER): Payer: Medicaid Other | Admitting: *Deleted

## 2013-06-11 VITALS — BP 110/64

## 2013-06-11 DIAGNOSIS — O99810 Abnormal glucose complicating pregnancy: Secondary | ICD-10-CM

## 2013-06-11 NOTE — Progress Notes (Signed)
NST performed today was reviewed and was found to be reactive.  AFI normal at 12.6 cm.  Continue recommended antenatal testing and prenatal care.

## 2013-06-11 NOTE — Progress Notes (Signed)
P = 73   Pt had fetal echo today and reports she was told everything normal with baby's heart.  [place] for growth scheduled on 12/22 to be changed to 06/26/13 @ 38.2wks per consult with Dr. Macon Large.

## 2013-06-12 ENCOUNTER — Telehealth: Payer: Self-pay | Admitting: *Deleted

## 2013-06-12 NOTE — Telephone Encounter (Signed)
Called pt and left message that she will not be having [place] on 12/22 as we discussed yesterday. I have changed the appt to 06/26/13 @ 1030. I will speak to her further about this appt and other appts needed at her next clinic appt on 12/22.

## 2013-06-12 NOTE — Addendum Note (Signed)
Addended by: Jill Side on: 06/12/2013 09:53 AM   Modules accepted: Orders

## 2013-06-15 ENCOUNTER — Ambulatory Visit (HOSPITAL_COMMUNITY): Payer: Medicaid Other

## 2013-06-15 ENCOUNTER — Other Ambulatory Visit: Payer: Medicaid Other

## 2013-06-16 ENCOUNTER — Telehealth: Payer: Self-pay | Admitting: *Deleted

## 2013-06-16 NOTE — Telephone Encounter (Signed)
Called pt on 12/22 @ 1130 and left message that she missed her appt today in clinic- was scheduled at 0830. There are available appts this afternoon if she is able to come in, please call the clinic to schedule her preferred time. Also, I need to discuss future appts with her.  12/23  0835  Called pt and left new message stating that she has clinic appt scheduled for tomorrow @ 0830. This appt is very important for her prenatal care. We will discuss future appts which have been made at that time.

## 2013-06-17 ENCOUNTER — Ambulatory Visit (INDEPENDENT_AMBULATORY_CARE_PROVIDER_SITE_OTHER): Payer: Medicaid Other | Admitting: *Deleted

## 2013-06-17 VITALS — BP 104/64

## 2013-06-17 DIAGNOSIS — O99810 Abnormal glucose complicating pregnancy: Secondary | ICD-10-CM

## 2013-06-17 NOTE — Progress Notes (Signed)
NST reviewed and reactive.  Mer[facility] Prell L. Harraway-Smith, M.D., FACOG    

## 2013-06-17 NOTE — Progress Notes (Signed)
P = 84   [place] for growth scheduled on 06/26/13.  IOL scheduled 07/01/13 @ 0630

## 2013-06-19 ENCOUNTER — Telehealth (HOSPITAL_COMMUNITY): Payer: Self-pay | Admitting: *Deleted

## 2013-06-19 NOTE — Telephone Encounter (Signed)
Preadmission screen Interpreter number [id number]  

## 2013-06-22 ENCOUNTER — Encounter: Payer: Self-pay | Admitting: Obstetrics and Gynecology

## 2013-06-22 ENCOUNTER — Ambulatory Visit (INDEPENDENT_AMBULATORY_CARE_PROVIDER_SITE_OTHER): Payer: Medicaid Other | Admitting: Obstetrics and Gynecology

## 2013-06-22 VITALS — BP 103/58 | Wt 185.7 lb

## 2013-06-22 DIAGNOSIS — O24913 Unspecified diabetes mellitus in pregnancy, third trimester: Secondary | ICD-10-CM

## 2013-06-22 DIAGNOSIS — N390 Urinary tract infection, site not specified: Secondary | ICD-10-CM

## 2013-06-22 DIAGNOSIS — O99810 Abnormal glucose complicating pregnancy: Secondary | ICD-10-CM

## 2013-06-22 DIAGNOSIS — Z3483 Encounter for supervision of other normal pregnancy, third trimester: Secondary | ICD-10-CM

## 2013-06-22 DIAGNOSIS — O283 Abnormal ultrasonic finding on antenatal screening of mother: Secondary | ICD-10-CM

## 2013-06-22 DIAGNOSIS — O2390 Unspecified genitourinary tract infection in pregnancy, unspecified trimester: Secondary | ICD-10-CM

## 2013-06-22 DIAGNOSIS — B951 Streptococcus, group B, as the cause of diseases classified elsewhere: Secondary | ICD-10-CM

## 2013-06-22 LAB — POCT URINALYSIS DIP (DEVICE)
Bilirubin Urine: NEGATIVE
Glucose, UA: NEGATIVE mg/dL
Hgb urine dipstick: NEGATIVE
Ketones, ur: NEGATIVE mg/dL
Specific Gravity, Urine: 1.025 (ref 1.005–1.030)
Urobilinogen, UA: 0.2 mg/dL (ref 0.0–1.0)

## 2013-06-22 LAB — OB RESULTS CONSOLE GC/CHLAMYDIA
CHLAMYDIA, DNA PROBE: NEGATIVE
Gonorrhea: NEGATIVE

## 2013-06-22 NOTE — Progress Notes (Signed)
Pulse-  79 Patient reports white itchy d/c and burning upon urination; also reports pelvic pressure and low back pain

## 2013-06-22 NOTE — Progress Notes (Signed)
Patient doing well, reports a pruritic discharge. Patient did not bring log book and reports highest fasting 98, mainly in the 70's and highest pp 101 (patient checked CBG 1 hr after bk this morning and it was 121). F/U growth [place] scheduled for 1/2 Wet prep and urine culture collected. IOL already scheduled for 1/7 NST reviewed and reactive

## 2013-06-23 ENCOUNTER — Telehealth: Payer: Self-pay | Admitting: *Deleted

## 2013-06-23 LAB — GC/CHLAMYDIA PROBE AMP
CT Probe RNA: NEGATIVE
GC Probe RNA: NEGATIVE

## 2013-06-23 LAB — WET PREP, GENITAL: Trich, Wet Prep: NONE SEEN

## 2013-06-23 NOTE — Telephone Encounter (Signed)
Left message on machine informing patient of flu restriction policy in the hospital.  No one under the age of 2 allowed at visit.

## 2013-06-24 LAB — CULTURE, OB URINE: Colony Count: 50000

## 2013-06-26 ENCOUNTER — Ambulatory Visit (HOSPITAL_COMMUNITY)
Admission: RE | Admit: 2013-06-26 | Discharge: 2013-06-26 | Disposition: A | Discharge status: Home / Self Care | Payer: Medicaid Other | Source: Ambulatory Visit | Attending: Obstetrics & Gynecology | Admitting: Obstetrics & Gynecology

## 2013-06-26 DIAGNOSIS — Z1389 Encounter for screening for other disorder: Secondary | ICD-10-CM | POA: Insufficient documentation

## 2013-06-26 DIAGNOSIS — O99810 Abnormal glucose complicating pregnancy: Secondary | ICD-10-CM | POA: Insufficient documentation

## 2013-06-26 DIAGNOSIS — O358XX0 Maternal care for other (suspected) fetal abnormality and damage, not applicable or unspecified: Secondary | ICD-10-CM | POA: Insufficient documentation

## 2013-06-26 DIAGNOSIS — Z363 Encounter for antenatal screening for malformations: Secondary | ICD-10-CM | POA: Insufficient documentation

## 2013-06-28 ENCOUNTER — Encounter (HOSPITAL_COMMUNITY): Payer: Self-pay | Admitting: *Deleted

## 2013-06-28 ENCOUNTER — Inpatient Hospital Stay (HOSPITAL_COMMUNITY)
Admission: AD | Admit: 2013-06-28 | Discharge: 2013-06-28 | Disposition: A | Discharge status: Home / Self Care | Payer: Medicaid Other | Source: Ambulatory Visit | Attending: Obstetrics & Gynecology | Admitting: Obstetrics & Gynecology

## 2013-06-28 DIAGNOSIS — O283 Abnormal ultrasonic finding on antenatal screening of mother: Secondary | ICD-10-CM

## 2013-06-28 DIAGNOSIS — O479 False labor, unspecified: Secondary | ICD-10-CM | POA: Insufficient documentation

## 2013-06-28 DIAGNOSIS — B951 Streptococcus, group B, as the cause of diseases classified elsewhere: Secondary | ICD-10-CM

## 2013-06-28 DIAGNOSIS — O2340 Unspecified infection of urinary tract in pregnancy, unspecified trimester: Secondary | ICD-10-CM

## 2013-06-28 DIAGNOSIS — Z3483 Encounter for supervision of other normal pregnancy, third trimester: Secondary | ICD-10-CM

## 2013-06-28 NOTE — Discharge Instructions (Signed)

## 2013-06-29 ENCOUNTER — Ambulatory Visit (INDEPENDENT_AMBULATORY_CARE_PROVIDER_SITE_OTHER): Payer: Medicaid Other | Admitting: Family Medicine

## 2013-06-29 VITALS — BP 112/71 | Temp 97.1°F | Wt 186.1 lb

## 2013-06-29 DIAGNOSIS — O2390 Unspecified genitourinary tract infection in pregnancy, unspecified trimester: Secondary | ICD-10-CM

## 2013-06-29 DIAGNOSIS — O99810 Abnormal glucose complicating pregnancy: Secondary | ICD-10-CM

## 2013-06-29 DIAGNOSIS — O2340 Unspecified infection of urinary tract in pregnancy, unspecified trimester: Principal | ICD-10-CM

## 2013-06-29 DIAGNOSIS — N390 Urinary tract infection, site not specified: Secondary | ICD-10-CM

## 2013-06-29 DIAGNOSIS — B951 Streptococcus, group B, as the cause of diseases classified elsewhere: Secondary | ICD-10-CM

## 2013-06-29 LAB — POCT URINALYSIS DIP (DEVICE)
BILIRUBIN URINE: NEGATIVE
Glucose, UA: NEGATIVE mg/dL
KETONES UR: NEGATIVE mg/dL
NITRITE: NEGATIVE
PH: 6 (ref 5.0–8.0)
Protein, ur: NEGATIVE mg/dL
Specific Gravity, Urine: 1.02 (ref 1.005–1.030)
Urobilinogen, UA: 0.2 mg/dL (ref 0.0–1.0)

## 2013-06-29 MED ORDER — INSULIN NPH (HUMAN) (ISOPHANE) 100 UNIT/ML ~~LOC~~ SUSP: SUBCUTANEOUS | Status: DC

## 2013-06-29 NOTE — Progress Notes (Signed)
[place] for growth done 06/26/13 - EFW = 89%, AFI = 10cm.  Pt seen @ MAU yesterday for r/o labor- had occasional UC's and FHR was reactive. NST not done today.  IOL scheduled 07/01/13 @ 0630

## 2013-06-29 NOTE — Progress Notes (Signed)
Highest FBS 110 after New Years--Increase pm NPH to 19 2 hour pp 125 is highest.  No book today. Membranes stripped.  See note from [place], related to EFW, IOL and NST from yesterday.

## 2013-06-29 NOTE — Progress Notes (Signed)
P= 63 Edema in feet.

## 2013-06-29 NOTE — Patient Instructions (Addendum)
Gestational Diabetes Mellitus Gestational diabetes mellitus, often simply referred to as gestational diabetes, is a type of diabetes that some women develop during pregnancy. In gestational diabetes, the pancreas does not make enough insulin (a hormone), the cells are less responsive to the insulin that is made (insulin resistance), or both.Normally, insulin moves sugars from food into the tissue cells. The tissue cells use the sugars for energy. The lack of insulin or the lack of normal response to insulin causes excess sugars to build up in the blood instead of going into the tissue cells. As a result, high blood sugar (hyperglycemia) develops. The effect of high sugar (glucose) levels can cause many complications.  RISK FACTORS You have an increased chance of developing gestational diabetes if you have a family history of diabetes and also have one or more of the following risk factors:  A body mass index over 30 (obesity).  A previous pregnancy with gestational diabetes.  An older age at the time of pregnancy. If blood glucose levels are kept in the normal range during pregnancy, women can have a healthy pregnancy. If your blood glucose levels are not well controlled, there may be risks to you, your unborn baby (fetus), your labor and delivery, or your newborn baby.  SYMPTOMS  If symptoms are experienced, they are much like symptoms you would normally expect during pregnancy. The symptoms of gestational diabetes include:   Increased thirst (polydipsia).  Increased urination (polyuria).  Increased urination during the night (nocturia).  Weight loss. This weight loss may be rapid.  Frequent, recurring infections.  Tiredness (fatigue).  Weakness.  Vision changes, such as blurred vision.  Fruity smell to your breath.  Abdominal pain. DIAGNOSIS Diabetes is diagnosed when blood glucose levels are increased. Your blood glucose level may be checked by one or more of the following  blood tests:  A fasting blood glucose test. You will not be allowed to eat for at least 8 hours before a blood sample is taken.  A random blood glucose test. Your blood glucose is checked at any time of the day regardless of when you ate.  A hemoglobin A1c blood glucose test. A hemoglobin A1c test provides information about blood glucose control over the previous 3 months.  An oral glucose tolerance test (OGTT). Your blood glucose is measured after you have not eaten (fasted) for 1 3 hours and then after you drink a glucose-containing beverage. Since the hormones that cause insulin resistance are highest at about 24 28 weeks of a pregnancy, an OGTT is usually performed during that time. If you have risk factors for gestational diabetes, your caregiver may test you for gestational diabetes earlier than 24 weeks of pregnancy. TREATMENT   You will need to take diabetes medicine or insulin daily to keep blood glucose levels in the desired range.  You will need to match insulin dosing with exercise and healthy food choices. The treatment goal is to maintain the before meal (preprandial), bedtime, and overnight blood glucose level at 60 99 mg/dL during pregnancy. The treatment goal is to further maintain peak after meal blood sugar (postprandial glucose) level at 100 140 mg/dL. HOME CARE INSTRUCTIONS   Have your hemoglobin A1c level checked twice a year.  Perform daily blood glucose monitoring as directed by your caregiver. It is common to perform frequent blood glucose monitoring.  Monitor urine ketones when you are ill and as directed by your caregiver.  Take your diabetes medicine and insulin as directed by your caregiver to maintain   your blood glucose level in the desired range.  Never run out of diabetes medicine or insulin. It is needed every day.  Adjust insulin based on your intake of carbohydrates. Carbohydrates can raise blood glucose levels but need to be included in your diet.  Carbohydrates provide vitamins, minerals, and fiber which are an essential part of a healthy diet. Carbohydrates are found in fruits, vegetables, whole grains, dairy products, legumes, and foods containing added sugars.    Eat healthy foods. Alternate 3 meals with 3 snacks.  Maintain a healthy weight gain. The usual total expected weight gain varies according to your prepregnancy body mass index (BMI).  Carry a medical alert card or wear your medical alert jewelry.  Carry a 15 gram carbohydrate snack with you at all times to treat low blood glucose (hypoglycemia). Some examples of 15 gram carbohydrate snacks include:  Glucose tablets, 3 or 4   Glucose gel, 15 gram tube  Raisins, 2 tablespoons (24 g)  Jelly beans, 6  Animal crackers, 8  Fruit juice, regular soda, or low fat milk, 4 ounces (120 mL)  Gummy treats, 9    Recognize hypoglycemia. Hypoglycemia during pregnancy occurs with blood glucose levels of 60 mg/dL and below. The risk for hypoglycemia increases when fasting or skipping meals, during or after intense exercise, and during sleep. Hypoglycemia symptoms can include:  Tremors or shakes.  Decreased ability to concentrate.  Sweating.  Increased heart rate.  Headache.  Dry mouth.  Hunger.  Irritability.  Anxiety.  Restless sleep.  Altered speech or coordination.  Confusion.  Treat hypoglycemia promptly. If you are alert and able to safely swallow, follow the 15:15 rule:  Take 15 20 grams of rapid-acting glucose or carbohydrate. Rapid-acting options include glucose gel, glucose tablets, or 4 ounces (120 mL) of fruit juice, regular soda, or low fat milk.  Check your blood glucose level 15 minutes after taking the glucose.   Take 15 20 grams more of glucose if the repeat blood glucose level is still 70 mg/dL or below.  Eat a meal or snack within 1 hour once blood glucose levels return to normal.  Be alert to polyuria and polydipsia which are early  signs of hyperglycemia. An early awareness of hyperglycemia allows for prompt treatment. Treat hyperglycemia as directed by your caregiver.  Engage in at least 30 minutes of physical activity a day or as directed by your caregiver. Ten minutes of physical activity timed 30 minutes after each meal is encouraged to control postprandial blood glucose levels.  Adjust your insulin dosing and food intake as needed if you start a new exercise or sport.  Follow your sick day plan at any time you are unable to eat or drink as usual.  Avoid tobacco and alcohol use.  Follow up with your caregiver regularly.  Follow the advice of your caregiver regarding your prenatal and post-delivery (postpartum) appointments, meal planning, exercise, medicines, vitamins, blood tests, other medical tests, and physical activities.  Perform daily skin and foot care. Examine your skin and feet daily for cuts, bruises, redness, nail problems, bleeding, blisters, or sores.  Brush your teeth and gums at least twice a day and floss at least once a day. Follow up with your dentist regularly.  Schedule an eye exam during the first trimester of your pregnancy or as directed by your caregiver.  Share your diabetes management plan with your workplace or school.  Stay up-to-date with immunizations.  Learn to manage stress.  Obtain ongoing diabetes education and  support as needed. SEEK MEDICAL CARE IF:   You are unable to eat food or drink fluids for more than 6 hours.  You have nausea and vomiting for more than 6 hours.  You have a blood glucose level of 200 mg/dL and you have ketones in your urine.  There is a change in mental status.  You develop vision problems.  You have a persistent headache.  You have upper abdominal pain or discomfort.  You develop an additional serious illness.  You have diarrhea for more than 6 hours.  You have been sick or have had a fever for a couple of days and are not getting  better. SEEK IMMEDIATE MEDICAL CARE IF:   You have difficulty breathing.  You no longer feel the baby moving.  You are bleeding or have discharge from your vagina.  You start having premature contractions or labor. MAKE SURE YOU:  Understand these instructions.  Will watch your condition.  Will get help right away if you are not doing well or get worse. Document Released: 09/17/2000 Document Revised: 10/06/2012 Document Reviewed: 01/08/2012 [facility] Patient Information 2014 [place], [place].  Breastfeeding Deciding to breastfeed is one of the best choices you can make for you and your baby. A change in hormones during pregnancy causes your breast tissue to grow and increases the number and size of your milk ducts. These hormones also allow proteins, sugars, and fats from your blood supply to make breast milk in your milk-producing glands. Hormones prevent breast milk from being released before your baby is born as well as prompt milk flow after birth. Once breastfeeding has begun, thoughts of your baby, as well as his or her sucking or crying, can stimulate the release of milk from your milk-producing glands.  BENEFITS OF BREASTFEEDING For Your Baby  Your first milk (colostrum) helps your baby's digestive system function better.   There are antibodies in your milk that help your baby fight off infections.   Your baby has a lower incidence of asthma, allergies, and sudden infant death syndrome.   The nutrients in breast milk are better for your baby than infant formulas and are designed uniquely for your baby's needs.   Breast milk improves your baby's brain development.   Your baby is less likely to develop other conditions, such as childhood obesity, asthma, or type 2 diabetes mellitus.  For You   Breastfeeding helps to create a very special bond between you and your baby.   Breastfeeding is convenient. Breast milk is always available at the correct temperature and  costs nothing.   Breastfeeding helps to burn calories and helps you lose the weight gained during pregnancy.   Breastfeeding makes your uterus contract to its prepregnancy size faster and slows bleeding (lochia) after you give birth.   Breastfeeding helps to lower your risk of developing type 2 diabetes mellitus, osteoporosis, and breast or ovarian cancer later in life. SIGNS THAT YOUR BABY IS HUNGRY Early Signs of Hunger  Increased alertness or activity.  Stretching.  Movement of the head from side to side.  Movement of the head and opening of the mouth when the corner of the mouth or cheek is stroked (rooting).  Increased sucking sounds, smacking lips, cooing, sighing, or squeaking.  Hand-to-mouth movements.  Increased sucking of fingers or hands. Late Signs of Hunger  Fussing.  Intermittent crying. Extreme Signs of Hunger Signs of extreme hunger will require calming and consoling before your baby will be able to breastfeed successfully. Do not  wait for the following signs of extreme hunger to occur before you initiate breastfeeding:   Restlessness.  A loud, strong cry.   Screaming. BREASTFEEDING BASICS Breastfeeding Initiation  Find a comfortable place to sit or lie down, with your neck and back well supported.  Place a pillow or rolled up blanket under your baby to bring him or her to the level of your breast (if you are seated). Nursing pillows are specially designed to help support your arms and your baby while you breastfeed.  Make sure that your baby's abdomen is facing your abdomen.   Gently massage your breast. With your fingertips, massage from your chest wall toward your nipple in a circular motion. This encourages milk flow. You may need to continue this action during the feeding if your milk flows slowly.  Support your breast with 4 fingers underneath and your thumb above your nipple. Make sure your fingers are well away from your nipple and your  baby's mouth.   Stroke your baby's lips gently with your finger or nipple.   When your baby's mouth is open wide enough, quickly bring your baby to your breast, placing your entire nipple and as much of the colored area around your nipple (areola) as possible into your baby's mouth.   More areola should be visible above your baby's upper lip than below the lower lip.   Your baby's tongue should be between his or her lower gum and your breast.   Ensure that your baby's mouth is correctly positioned around your nipple (latched). Your baby's lips should create a seal on your breast and be turned out (everted).  It is common for your baby to suck about 2 3 minutes in order to start the flow of breast milk. Latching Teaching your baby how to latch on to your breast properly is very important. An improper latch can cause nipple pain and decreased milk supply for you and poor weight gain in your baby. Also, if your baby is not latched onto your nipple properly, he or she may swallow some air during feeding. This can make your baby fussy. Burping your baby when you switch breasts during the feeding can help to get rid of the air. However, teaching your baby to latch on properly is still the best way to prevent fussiness from swallowing air while breastfeeding. Signs that your baby has successfully latched on to your nipple:    Silent tugging or silent sucking, without causing you pain.   Swallowing heard between every 3 4 sucks.    Muscle movement above and in front of his or her ears while sucking.  Signs that your baby has not successfully latched on to nipple:   Sucking sounds or smacking sounds from your baby while breastfeeding.  Nipple pain. If you think your baby has not latched on correctly, slip your finger into the corner of your baby's mouth to break the suction and place it between your baby's gums. Attempt breastfeeding initiation again. Signs of Successful  Breastfeeding Signs from your baby:   A gradual decrease in the number of sucks or complete cessation of sucking.   Falling asleep.   Relaxation of his or her body.   Retention of a small amount of milk in his or her mouth.   Letting go of your breast by himself or herself. Signs from you:  Breasts that have increased in firmness, weight, and size 1 3 hours after feeding.   Breasts that are softer immediately after breastfeeding.  Increased milk volume, as well as a change in milk consistency and color by the 5th day of breastfeeding.   Nipples that are not sore, cracked, or bleeding. Signs That Your [employer] is Getting Enough Milk  Wetting at least 3 diapers in a 24-hour period. The urine should be clear and pale yellow by age 98 days.  At least 3 stools in a 24-hour period by age 98 days. The stool should be soft and yellow.  At least 3 stools in a 24-hour period by age 29 days. The stool should be seedy and yellow.  No loss of weight greater than 10% of birth weight during the first 75 days of age.  Average weight gain of 4 7 ounces (120 210 mL) per week after age 60 days.  Consistent daily weight gain by age 601 days, without weight loss after the age of 2 weeks. After a feeding, your baby may spit up a small amount. This is common. BREASTFEEDING FREQUENCY AND DURATION Frequent feeding will help you make more milk and can prevent sore nipples and breast engorgement. Breastfeed when you feel the need to reduce the fullness of your breasts or when your baby shows signs of hunger. This is called "breastfeeding on demand." Avoid introducing a pacifier to your baby while you are working to establish breastfeeding (the first 4 6 weeks after your baby is born). After this time you may choose to use a pacifier. Research has shown that pacifier use during the first year of a baby's life decreases the risk of sudden infant death syndrome (SIDS). Allow your baby to feed on each breast as  long as he or she wants. Breastfeed until your baby is finished feeding. When your baby unlatches or falls asleep while feeding from the first breast, offer the second breast. Because newborns are often sleepy in the first few weeks of life, you may need to awaken your baby to get him or her to feed. Breastfeeding times will vary from baby to baby. However, the following rules can serve as a guide to help you ensure that your baby is properly fed:  Newborns (babies 40 weeks of age or younger) may breastfeed every 1 3 hours.  Newborns should not go longer than 3 hours during the day or 5 hours during the night without breastfeeding.  You should breastfeed your baby a minimum of 8 times in a 24-hour period until you begin to introduce solid foods to your baby at around 42 months of age. BREAST MILK PUMPING Pumping and storing breast milk allows you to ensure that your baby is exclusively fed your breast milk, even at times when you are unable to breastfeed. This is especially important if you are going back to work while you are still breastfeeding or when you are not able to be present during feedings. Your lactation consultant can give you guidelines on how long it is safe to store breast milk.  A breast pump is a machine that allows you to pump milk from your breast into a sterile bottle. The pumped breast milk can then be stored in a refrigerator or freezer. Some breast pumps are operated by hand, while others use electricity. Ask your lactation consultant which type will work best for you. Breast pumps can be purchased, but some hospitals and breastfeeding support groups lease breast pumps on a monthly basis. A lactation consultant can teach you how to hand express breast milk, if you prefer not to use a pump.  CARING FOR  YOUR BREASTS WHILE YOU BREASTFEED Nipples can become dry, cracked, and sore while breastfeeding. The following recommendations can help keep your breasts moisturized and  healthy:  Avoid using soap on your nipples.   Wear a supportive bra. Although not required, special nursing bras and tank tops are designed to allow access to your breasts for breastfeeding without taking off your entire bra or top. Avoid wearing underwire style bras or extremely tight bras.  Air dry your nipples for 3 51minutes after each feeding.   Use only cotton bra pads to absorb leaked breast milk. Leaking of breast milk between feedings is normal.   Use lanolin on your nipples after breastfeeding. Lanolin helps to maintain your skin's normal moisture barrier. If you use pure lanolin you do not need to wash it off before feeding your baby again. Pure lanolin is not toxic to your baby. You may also hand express a few drops of breast milk and gently massage that milk into your nipples and allow the milk to air dry. In the first few weeks after giving birth, some women experience extremely full breasts (engorgement). Engorgement can make your breasts feel heavy, warm, and tender to the touch. Engorgement peaks within 3 5 days after you give birth. The following recommendations can help ease engorgement:  Completely empty your breasts while breastfeeding or pumping. You may want to start by applying warm, moist heat (in the shower or with warm water-soaked hand towels) just before feeding or pumping. This increases circulation and helps the milk flow. If your baby does not completely empty your breasts while breastfeeding, pump any extra milk after he or she is finished.  Wear a snug bra (nursing or regular) or tank top for 1 2 days to signal your body to slightly decrease milk production.  Apply ice packs to your breasts, unless this is too uncomfortable for you.  Make sure that your baby is latched on and positioned properly while breastfeeding. If engorgement persists after 48 hours of following these recommendations, contact your health care provider or a Science writer. OVERALL  HEALTH CARE RECOMMENDATIONS WHILE BREASTFEEDING  Eat healthy foods. Alternate between meals and snacks, eating 3 of each per day. Because what you eat affects your breast milk, some of the foods may make your baby more irritable than usual. Avoid eating these foods if you are sure that they are negatively affecting your baby.  Drink milk, fruit juice, and water to satisfy your thirst (about 10 glasses a day).   Rest often, relax, and continue to take your prenatal vitamins to prevent fatigue, stress, and anemia.  Continue breast self-awareness checks.  Avoid chewing and smoking tobacco.  Avoid alcohol and drug use. Some medicines that may be harmful to your baby can pass through breast milk. It is important to ask your health care provider before taking any medicine, including all over-the-counter and prescription medicine as well as vitamin and herbal supplements. It is possible to become pregnant while breastfeeding. If birth control is desired, ask your health care provider about options that will be safe for your baby. SEEK MEDICAL CARE IF:   You feel like you want to stop breastfeeding or have become frustrated with breastfeeding.  You have painful breasts or nipples.  Your nipples are cracked or bleeding.  Your breasts are red, tender, or warm.  You have a swollen area on either breast.  You have a fever or chills.  You have nausea or vomiting.  You have drainage other than breast  milk from your nipples.  Your breasts do not become full before feedings by the 5th day after you give birth.  You feel sad and depressed.  Your baby is too sleepy to eat well.  Your baby is having trouble sleeping.   Your baby is wetting less than 3 diapers in a 24-hour period.  Your baby has less than 3 stools in a 24-hour period.  Your baby's skin or the white part of his or her eyes becomes yellow.   Your baby is not gaining weight by 41 days of age. SEEK IMMEDIATE MEDICAL CARE  IF:   Your baby is overly tired (lethargic) and does not want to wake up and feed.  Your baby develops an unexplained fever. Document Released: 06/11/2005 Document Revised: 02/11/2013 Document Reviewed: 12/03/2012 [facility] Patient Information 2014 [place]. Labor Induction  Labor induction is when steps are taken to cause a pregnant woman to begin the labor process. Most women go into labor on their own between 37 weeks and 42 weeks of the pregnancy. When this does not happen or when there is a medical need, methods may be used to induce labor. Labor induction causes a pregnant woman's uterus to contract. It also causes the cervix to soften (ripen), open (dilate), and thin out (efface). Usually, labor is not induced before 39 weeks of the pregnancy unless there is a problem with the baby or mother.  Before inducing labor, your health care provider will consider a number of factors, including the following:  The medical condition of you and the baby.   How many weeks along you are.   The status of the baby's lung maturity.   The condition of the cervix.   The position of the baby.  WHAT ARE THE REASONS FOR LABOR INDUCTION? Labor may be induced for the following reasons:  The health of the baby or mother is at risk.   The pregnancy is overdue by 1 week or more.   The water breaks but labor does not start on its own.   The mother has a health condition or serious illness, such as high blood pressure, infection, placental abruption, or diabetes.  The amniotic fluid amounts are low around the baby.   The baby is distressed.  Convenience or wanting the baby to be born on a certain date is not a reason for inducing labor. WHAT METHODS ARE USED FOR LABOR INDUCTION? Several methods of labor induction may be used, such as:   Prostaglandin medicine. This medicine causes the cervix to dilate and ripen. The medicine will also start contractions. It can be taken by mouth or  by inserting a suppository into the vagina.   Inserting a thin tube (catheter) with a balloon on the end into the vagina to dilate the cervix. Once inserted, the balloon is expanded with water, which causes the cervix to open.   Stripping the membranes. Your health care provider separates amniotic sac tissue from the cervix, causing the cervix to be stretched and causing the release of a hormone called progesterone. This may cause the uterus to contract. It is often done during an office visit. You will be sent home to wait for the contractions to begin. You will then come in for an induction.   Breaking the water. Your health care provider makes a hole in the amniotic sac using a small instrument. Once the amniotic sac breaks, contractions should begin. This may still take hours to see an effect.   Medicine to trigger  or strengthen contractions. This medicine is given through an IV access tube inserted into a vein in your arm.  All of the methods of induction, besides stripping the membranes, will be done in the hospital. Induction is done in the hospital so that you and the baby can be carefully monitored.  HOW LONG DOES IT TAKE FOR LABOR TO BE INDUCED? Some inductions can take up to 2 3 days. Depending on the cervix, it usually takes less time. It takes longer when you are induced early in the pregnancy or if this is your first pregnancy. If a mother is still pregnant and the induction has been going on for 2 3 days, either the mother will be sent home or a cesarean delivery will be needed. WHAT ARE THE RISKS ASSOCIATED WITH LABOR INDUCTION? Some of the risks of induction include:   Changes in fetal heart rate, such as too high, too low, or erratic.   Fetal distress.   Chance of infection for the mother and baby.   Increased chance of having a cesarean delivery.   Breaking off (abruption) of the placenta from the uterus (rare).   Uterine rupture (very rare).  When induction is  needed for medical reasons, the benefits of induction may outweigh the risks. WHAT ARE SOME REASONS FOR NOT INDUCING LABOR? Labor induction should not be done if:   It is shown that your baby does not tolerate labor.   You have had previous surgeries on your uterus, such as a myomectomy or the removal of fibroids.   Your placenta lies very low in the uterus and blocks the opening of the cervix (placenta previa).   Your baby is not in a head-down position.   The umbilical cord drops down into the birth canal in front of the baby. This could cut off the baby's blood and oxygen supply.   You have had a previous cesarean delivery.   There are unusual circumstances, such as the baby being extremely premature.  Document Released: 10/31/2006 Document Revised: 02/11/2013 Document Reviewed: 01/08/2013 [facility] Patient Information 2014 [place], [place].

## 2013-06-30 ENCOUNTER — Inpatient Hospital Stay (HOSPITAL_COMMUNITY): Payer: Medicaid Other

## 2013-06-30 ENCOUNTER — Encounter (HOSPITAL_COMMUNITY): Payer: Self-pay | Admitting: *Deleted

## 2013-06-30 ENCOUNTER — Inpatient Hospital Stay (HOSPITAL_COMMUNITY)
Admission: AD | Admit: 2013-06-30 | Discharge: 2013-06-30 | Disposition: A | Discharge status: Home / Self Care | Payer: Medicaid Other | Source: Ambulatory Visit | Attending: Obstetrics and Gynecology | Admitting: Obstetrics and Gynecology

## 2013-06-30 ENCOUNTER — Telehealth: Payer: Self-pay | Admitting: *Deleted

## 2013-06-30 DIAGNOSIS — O368390 Maternal care for abnormalities of the fetal heart rate or rhythm, unspecified trimester, not applicable or unspecified: Secondary | ICD-10-CM | POA: Insufficient documentation

## 2013-06-30 DIAGNOSIS — O99810 Abnormal glucose complicating pregnancy: Secondary | ICD-10-CM | POA: Insufficient documentation

## 2013-06-30 DIAGNOSIS — O4693 Antepartum hemorrhage, unspecified, third trimester: Secondary | ICD-10-CM

## 2013-06-30 DIAGNOSIS — Z794 Long term (current) use of insulin: Secondary | ICD-10-CM | POA: Insufficient documentation

## 2013-06-30 DIAGNOSIS — O26859 Spotting complicating pregnancy, unspecified trimester: Secondary | ICD-10-CM | POA: Insufficient documentation

## 2013-06-30 LAB — URINALYSIS, ROUTINE W REFLEX MICROSCOPIC
Bilirubin Urine: NEGATIVE
Glucose, UA: >1000 mg/dL — AB
Ketones, ur: NEGATIVE mg/dL
NITRITE: NEGATIVE
Protein, ur: NEGATIVE mg/dL
SPECIFIC GRAVITY, URINE: 1.025 (ref 1.005–1.030)
UROBILINOGEN UA: 0.2 mg/dL (ref 0.0–1.0)
pH: 5.5 (ref 5.0–8.0)

## 2013-06-30 LAB — URINE MICROSCOPIC-ADD ON

## 2013-06-30 LAB — WET PREP, GENITAL
Clue Cells Wet Prep HPF POC: NONE SEEN
Trich, Wet Prep: NONE SEEN
Yeast Wet Prep HPF POC: NONE SEEN

## 2013-06-30 NOTE — MAU Note (Signed)
Pt states she passed a blood clot in the toilet around 1100 after urination, pt thinks it was definitely vaginal bleeding.  Was seeing bloody mucus yesterday.  Has had no bleeding since today's incident.  Has pelvic pressure, denies LOF.

## 2013-06-30 NOTE — Discharge Instructions (Signed)
Inducción del trabajo de parto   (Labor Induction )  Se denomina inducción del trabajo de parto cuando se inician acciones para hacer que una mujer embarazada comience el trabajo de parto. La mayoría de las mujeres comienzan el trabajo de parto sin ayuda entre las semanas 37 y 42 del embarazo. Cuando esto no ocurre o cuando hay una necesidad médica, pueden utilizarse diferentes métodos para inducirlo. La inducción del trabajo de parto hace que el útero se contraiga. También hace que el cuello del útero se ablandemadure), se abra (se dilate), y se afine (se borre). Generalmente el trabajo de parto no se induce antes de las 39 semanas excepto que haya un problema con el bebé o con la madre.   Antes de inducir el trabajo de parto, el médico considerará cierto número de factores incluyendo los siguientes:  · El estado del bebé.  · Cuántas semanas tiene de embarazo.  · La madurez de los pulmones del bebé.  · El estado del cuello del útero.  · La posición del bebé.  ¿CUÁLES SON LOS MOTIVOS PARA INDUCIR UN PARTO?  El trabajo de parto puede inducirse por las siguientes razones:  · La salud del bebé o de la madre están en riesgo.  · El embarazo se ha pasado de término en 1 semana o más.  · Ha roto la bolsa de aguas pero no se ha iniciado el trabajo de parto por sí mismo.  · La madre tiene algún trastorno de salud o una enfermedad grave, como hipertensión arterial, una infección, desprendimiento abrupto de la placenta o diabetes.  · Hay escaso líquido amniótico alrededor del bebé.  · El bebé presenta sufrimiento.  La conveniencia o el deseo de que el bebé nazca en una cierta fecha no es un motivo para inducir el parto.  ¿CUÁLES SON LOS MÉTODOS UTILIZADOS PARA INDUCIR EL TRABAJO DE PARTO?  Algunos métodos de inducción del trabajo de parto son:   · Administración del medicamentos prostaglandina. Este medicamento hace que el cuello uterino se dilate y madure. Este medicamento también iniciará las contracciones. Puede tomarse por  boca o insertarse en la vagina en forma de supositorio.  · Inserción en la vagina de un tubo delgado (catéter) con un balón en el extremo para dilatar el cuello del útero. Una vez insertado, el balón se infla con agua, lo que provoca la apertura del cuello del útero.  · Ruptura de las membranas. El médico separa el saco amniótico del cuello uterino, haciendo que el cuello uterino se distienda y cause la liberación de la hormona llamada progesterona. Esto hace que el útero se contraiga. Este procedimiento se realiza durante una visita al consultorio médico. Le indicarán que vuelva a su casa y espere que se inicien las contracciones. Luego tendrá que volver para la inducción.  · Ruptura de la bolsa de aguas. El médico romperá el saco amniótico con un pequeño instrumento. Una vez que el saco amniótico se rompe, las contracciones deben comenzar. Pueden pasar algunas horas hasta que haga efecto.  · Medicamentos que desencadenen o intensifiquen las contracciones. Se lo administrarán a través de un catéter por vía intravenosa (IV) que se inserta en una de las venas del brazo.  Todos los métodos de inducción, excepto la ruptura de membranas, se realizan en el hospital. La inducción se realizará en el hospital, de modo que usted y el bebé puedan ser controlados cuidadosamente.   ¿CUÁNTO TIEMPO LLEVA INDUCIR EL TRABAJO DE PARTO?  Algunas inducciones pueden demorar entre 2 y   3 días. Generalmente lleva menos tiempo, dependiendo del estado del cuello del útero. Puede tomar más tiempo si la inducción se realiza en etapas tempranas del embarazo o es su primer embarazo. Si han pasado 2 o 3 días y no se inicia el trabajo de parto, podrán enviarla a su casa o realizar una cesárea.  ¿CUÁLES SON LOS RIESGOS ASOCIADOS CON LA INDUCCiÓN DEL TRABAJO DE PARTO?  Algunos de los riesgos de la inducción son:   · Cambios en la frecuencia cardíaca fetal, por ejemplo los latidos son demasiado rápidos, o lentos, o erráticos.  · Riesgo de distrés  fetal.  · Posibilidad de infección en la madre o el bebé.  · Aumento de la posibilidad de que sea necesaria una cesárea.  · Ruptura (abrupción) de la placenta del útero (raro).  · Ruptura uterina (muy raro).  Cuando es necesario realizar la inducción por razones médicas, los beneficios deben superar a los riesgos.  ¿CUÁLES SON ALGUNAS RAZONES PARA NO INDUCIR EL TRABAJO DE PARTO?  La inducción no debe realizarse si:   · Se demuestra que el bebé no tolera el trabajo de parto.  · Fue sometida anteriormente a cirugías en el útero, como una miomectomía o le han extirpado fibromas.  · La placenta está en una posición muy baja en el útero y obstruye la abertura del cuello (placenta previa).  · El bebé no está ubicado con la cabeza hacia bajo.  · El cordón umbilical cae hacia el canal de parto, adelante del bebé. Esto puede cortar el suministro de sangre y oxígeno al bebé.  · Fue sometida a una cesárea anteriormente.  · Hay circunstancias poco habituales, como que el bebé es extremadamente prematuro.  Document Released: 09/18/2007 Document Revised: 02/11/2013  [employer]® Patient Information ©2014 [employer].

## 2013-06-30 NOTE — MAU Provider Note (Signed)
Dilation: 4 Effacement (%): 50 Cervical Position: Posterior Station: -3 Presentation: Vertex Exam by:: Dr. Leslie Andrea  FHT 150-60s mod var, no accels, +variable decel x1 with ctx brief with quick recovery Q7-96min ctx BPP 8/8  I spoke with and examined patient and agree with resident's note and plan of care.  Fredrik Rigger, MD OB Fellow 06/30/2013 6:47 PM

## 2013-06-30 NOTE — MAU Provider Note (Signed)
History     CSN: [id number]  Arrival date and time: 06/30/13 1430   None     No chief complaint on file.  HPI Pt is a 33 y.o. [id number] at [redacted]w[redacted]d who presents with spotting for the past day. She reports that she noticed a small amount of blood on the tissue today but none in the toilet. She reports that she was seen in clinic yesterday and her membranes were stripped. At that time she was 3cm/50%/-3. Since that time she has been having more contractions but at the most every 10 minutes and they are uncomfortable but do not stop her from speaking. Her pregnancy is complicated with H0WCB for which she takes insulin. She is scheduled to be induced tomorrow for same. She is feeling normal fetal movement.   OB History   Grav Para Term Preterm Abortions TAB SAB Ect Mult Living   5 3 3  1 1    3       Past Medical History  Diagnosis Date  . Asthma     does not use inhaler  . Diabetes mellitus without complication   . Gestational diabetes     Past Surgical History  Procedure Laterality Date  . No past surgeries      Family History  Problem Relation Age of Onset  . Diabetes Mother   . Hypertension Mother   . Arthritis Mother   . Hyperlipidemia Father     History  Substance Use Topics  . Smoking status: Never Smoker   . Smokeless tobacco: Never Used  . Alcohol Use: No    Allergies: No Known Allergies  Prescriptions prior to admission  Medication Sig Dispense Refill  . insulin aspart (NOVOLOG) 100 UNIT/ML injection Inject 15 Units into the skin 3 (three) times daily with meals.  10 mL  12  . insulin NPH Human (HUMULIN N,NOVOLIN N) 100 UNIT/ML injection Take 13 units in the morning and 19 units every night.  10 mL  3  . Prenatal Vit-Fe Fumarate-FA (PRENATAL MULTIVITAMIN) TABS tablet Take 1 tablet by mouth daily at 12 noon.      Marland Kitchen ACCU-CHEK FASTCLIX LANCETS MISC 1 each by Percutaneous route 4 (four) times daily.  100 each  12  . glucose blood test strip Use as instructed-  check blood sugar 4 times daily.  50 each  12  . Insulin Syringe-Needle U-100 (INSULIN SYRINGE 1CC/31GX5/16") 31G X 5/16" 1 ML MISC 1 Units by Does not apply route as directed.  100 each  3    Review of Systems  Constitutional: Negative for fever and chills.  Respiratory: Negative for shortness of breath.   Cardiovascular: Negative for chest pain.  Gastrointestinal: Negative for diarrhea.  Musculoskeletal: Positive for back pain.  Neurological: Negative for headaches.  All other systems reviewed and are negative.   Physical Exam   Blood pressure 111/60, pulse 65, temperature 98.2 F (36.8 C), temperature source Oral, resp. rate 18, last menstrual period 10/05/2012.  Physical Exam  Nursing note and vitals reviewed. Constitutional: She is oriented to person, place, and time. She appears well-developed and well-nourished. No distress.  HENT:  Head: Normocephalic and atraumatic.  Right Ear: External ear normal.  Left Ear: External ear normal.  Nose: Nose normal.  Eyes: Conjunctivae are normal. Right eye exhibits no discharge. Left eye exhibits no discharge. No scleral icterus.  Respiratory: Effort normal.  GI:  Gravid uterus  Genitourinary: Uterus normal. No labial fusion. There is no rash, tenderness, lesion or injury on  the right labia. There is no rash, tenderness, lesion or injury on the left labia. Cervix exhibits no motion tenderness, no discharge and no friability. There is bleeding around the vagina. No erythema or tenderness around the vagina. No foreign body around the vagina. No signs of injury around the vagina. Vaginal discharge found.  Musculoskeletal: She exhibits no edema.  Neurological: She is alert and oriented to person, place, and time.  Skin: Skin is warm and dry. She is not diaphoretic.  Psychiatric: She has a normal mood and affect.    MAU Course  Procedures  MDM Speculum exam: clear and yellow discharge with a small amount of blood present. No pooling of  blood. Cervical exam: 4cm/50%/-3 Fetal monitoring: occasional variable decels, few accels, minimal variability Results for orders placed during the hospital encounter of 06/30/13 (from the past 24 hour(s))  URINALYSIS, ROUTINE W REFLEX MICROSCOPIC     Status: Abnormal   Collection Time    06/30/13  2:32 PM      Result Value Range   Color, Urine YELLOW  YELLOW   APPearance CLEAR  CLEAR   Specific Gravity, Urine 1.025  1.005 - 1.030   pH 5.5  5.0 - 8.0   Glucose, UA >1000 (*) NEGATIVE mg/dL   Hgb urine dipstick SMALL (*) NEGATIVE   Bilirubin Urine NEGATIVE  NEGATIVE   Ketones, ur NEGATIVE  NEGATIVE mg/dL   Protein, ur NEGATIVE  NEGATIVE mg/dL   Urobilinogen, UA 0.2  0.0 - 1.0 mg/dL   Nitrite NEGATIVE  NEGATIVE   Leukocytes, UA TRACE (*) NEGATIVE  URINE MICROSCOPIC-ADD ON     Status: Abnormal   Collection Time    06/30/13  2:32 PM      Result Value Range   Squamous Epithelial / LPF MANY (*) RARE   WBC, UA 11-20  <3 WBC/hpf   RBC / HPF 0-2  <3 RBC/hpf  WET PREP, GENITAL     Status: Abnormal   Collection Time    06/30/13  3:30 PM      Result Value Range   Yeast Wet Prep HPF POC NONE SEEN  NONE SEEN   Trich, Wet Prep NONE SEEN  NONE SEEN   Clue Cells Wet Prep HPF POC NONE SEEN  NONE SEEN   WBC, Wet Prep HPF POC MANY (*) NONE SEEN   BPP 8/8  Assessment and Plan  Spotting likely caused by membrane stripping yesterday. No signs of labor at this point. Wet prep sent for large discharge, no clue cells, yeast or trich. NST minimally reactive so BPP sent, 8/8. Continue planned induction tomorrow for A2GDM.   [name] 06/30/2013, 5:36 PM

## 2013-06-30 NOTE — Telephone Encounter (Signed)
Wanda Harris called and reports is due to be induced tomorrow but today had a blood clot when she went to the bathroom and wiped- states it was without bleeding or pain. Called Wanda Harris and advised her to come to MAU for evaluation today asap which she agreed to.

## 2013-07-01 ENCOUNTER — Encounter (HOSPITAL_COMMUNITY): Payer: Self-pay | Admitting: *Deleted

## 2013-07-01 ENCOUNTER — Inpatient Hospital Stay (HOSPITAL_COMMUNITY): Admission: RE | Admit: 2013-07-01 | Payer: Medicaid Other | Source: Ambulatory Visit

## 2013-07-01 ENCOUNTER — Encounter (HOSPITAL_COMMUNITY): Payer: Self-pay | Admitting: Anesthesiology

## 2013-07-01 ENCOUNTER — Inpatient Hospital Stay (HOSPITAL_COMMUNITY): Payer: Medicaid Other | Admitting: Anesthesiology

## 2013-07-01 ENCOUNTER — Encounter (HOSPITAL_COMMUNITY)
Admission: AD | Disposition: A | Discharge status: Home / Self Care | Payer: Self-pay | Source: Ambulatory Visit | Attending: Obstetrics and Gynecology

## 2013-07-01 ENCOUNTER — Inpatient Hospital Stay (HOSPITAL_COMMUNITY)
Admission: AD | Admit: 2013-07-01 | Discharge: 2013-07-04 | DRG: 765 | Disposition: A | Discharge status: Home / Self Care | Payer: Medicaid Other | Source: Ambulatory Visit | Attending: Obstetrics and Gynecology | Admitting: Obstetrics and Gynecology

## 2013-07-01 ENCOUNTER — Encounter (HOSPITAL_COMMUNITY): Payer: Medicaid Other | Admitting: Anesthesiology

## 2013-07-01 DIAGNOSIS — O330 Maternal care for disproportion due to deformity of maternal pelvic bones: Secondary | ICD-10-CM | POA: Diagnosis present

## 2013-07-01 DIAGNOSIS — O358XX0 Maternal care for other (suspected) fetal abnormality and damage, not applicable or unspecified: Secondary | ICD-10-CM | POA: Diagnosis present

## 2013-07-01 DIAGNOSIS — O99892 Other specified diseases and conditions complicating childbirth: Secondary | ICD-10-CM | POA: Diagnosis present

## 2013-07-01 DIAGNOSIS — O411090 Infection of amniotic sac and membranes, unspecified, unspecified trimester, not applicable or unspecified: Secondary | ICD-10-CM

## 2013-07-01 DIAGNOSIS — O99814 Abnormal glucose complicating childbirth: Secondary | ICD-10-CM

## 2013-07-01 DIAGNOSIS — O9989 Other specified diseases and conditions complicating pregnancy, childbirth and the puerperium: Secondary | ICD-10-CM

## 2013-07-01 DIAGNOSIS — Z22330 Carrier of Group B streptococcus: Secondary | ICD-10-CM

## 2013-07-01 DIAGNOSIS — O411290 Chorioamnionitis, unspecified trimester, not applicable or unspecified: Secondary | ICD-10-CM

## 2013-07-01 DIAGNOSIS — O339 Maternal care for disproportion, unspecified: Secondary | ICD-10-CM | POA: Diagnosis not present

## 2013-07-01 LAB — CBC
HEMATOCRIT: 31.8 % — AB (ref 36.0–46.0)
Hemoglobin: 10.5 g/dL — ABNORMAL LOW (ref 12.0–15.0)
MCH: 23.1 pg — AB (ref 26.0–34.0)
MCHC: 33 g/dL (ref 30.0–36.0)
MCV: 69.9 fL — AB (ref 78.0–100.0)
PLATELETS: 279 10*3/uL (ref 150–400)
RBC: 4.55 MIL/uL (ref 3.87–5.11)
RDW: 16.3 % — ABNORMAL HIGH (ref 11.5–15.5)
WBC: 11.4 10*3/uL — ABNORMAL HIGH (ref 4.0–10.5)

## 2013-07-01 LAB — GLUCOSE, CAPILLARY
GLUCOSE-CAPILLARY: 98 mg/dL (ref 70–99)
GLUCOSE-CAPILLARY: 132 mg/dL — AB (ref 70–99)
GLUCOSE-CAPILLARY: 145 mg/dL — AB (ref 70–99)
GLUCOSE-CAPILLARY: 151 mg/dL — AB (ref 70–99)
GLUCOSE-CAPILLARY: 113 mg/dL — AB (ref 70–99)
GLUCOSE-CAPILLARY: 79 mg/dL (ref 70–99)
GLUCOSE-CAPILLARY: 77 mg/dL (ref 70–99)
Glucose-Capillary: 131 mg/dL — ABNORMAL HIGH (ref 70–99)
Glucose-Capillary: 127 mg/dL — ABNORMAL HIGH (ref 70–99)
Glucose-Capillary: 90 mg/dL (ref 70–99)
Glucose-Capillary: 88 mg/dL (ref 70–99)
Glucose-Capillary: 77 mg/dL (ref 70–99)
Glucose-Capillary: 97 mg/dL (ref 70–99)
Glucose-Capillary: 106 mg/dL — ABNORMAL HIGH (ref 70–99)
Glucose-Capillary: 103 mg/dL — ABNORMAL HIGH (ref 70–99)
Glucose-Capillary: 113 mg/dL — ABNORMAL HIGH (ref 70–99)

## 2013-07-01 LAB — TYPE AND SCREEN
ABO/RH(D): O POS
Antibody Screen: NEGATIVE

## 2013-07-01 LAB — RPR: RPR Ser Ql: NONREACTIVE

## 2013-07-01 SURGERY — Surgical Case
Anesthesia: Epidural | Site: Abdomen

## 2013-07-01 MED ORDER — IBUPROFEN 600 MG PO TABS: 600.0000 mg | ORAL_TABLET | Freq: Four times a day (QID) | ORAL | Status: DC | PRN

## 2013-07-01 MED ORDER — LACTATED RINGERS IV SOLN
500.0000 mL | INTRAVENOUS | Status: DC | PRN
  Administered 2013-07-01: 200 mL via INTRAVENOUS
  Administered 2013-07-01 (×2): 500 mL via INTRAVENOUS
  Administered 2013-07-01: 300 mL via INTRAVENOUS

## 2013-07-01 MED ORDER — OXYTOCIN BOLUS FROM INFUSION: 500.0000 mL | INTRAVENOUS | Status: DC

## 2013-07-01 MED ORDER — NALBUPHINE SYRINGE 5 MG/0.5 ML: 5.0000 mg | INJECTION | INTRAMUSCULAR | Status: DC | PRN

## 2013-07-01 MED ORDER — CEFAZOLIN SODIUM-DEXTROSE 2-3 GM-% IV SOLR
INTRAVENOUS | Status: AC
  Filled 2013-07-01: qty 50

## 2013-07-01 MED ORDER — OXYTOCIN 10 UNIT/ML IJ SOLN
40.0000 [IU] | INTRAVENOUS | Status: DC | PRN
  Administered 2013-07-01: 40 [IU] via INTRAVENOUS

## 2013-07-01 MED ORDER — LIDOCAINE HCL (PF) 1 % IJ SOLN
INTRAMUSCULAR | Status: DC | PRN
  Administered 2013-07-01 (×2): 5 mL

## 2013-07-01 MED ORDER — SODIUM CHLORIDE 0.9 % IV SOLN
INTRAVENOUS | Status: DC
  Administered 2013-07-01: 0.9 [IU]/h via INTRAVENOUS
  Filled 2013-07-01: qty 1

## 2013-07-01 MED ORDER — OXYTOCIN 40 UNITS IN LACTATED RINGERS INFUSION - SIMPLE MED: 62.5000 mL/h | INTRAVENOUS | Status: DC

## 2013-07-01 MED ORDER — HYDROMORPHONE HCL PF 1 MG/ML IJ SOLN
INTRAMUSCULAR | Status: AC
  Filled 2013-07-01: qty 1

## 2013-07-01 MED ORDER — OXYCODONE-ACETAMINOPHEN 5-325 MG PO TABS: 1.0000 | ORAL_TABLET | ORAL | Status: DC | PRN

## 2013-07-01 MED ORDER — EPHEDRINE 5 MG/ML INJ
10.0000 mg | INTRAVENOUS | Status: DC | PRN
  Filled 2013-07-01: qty 4

## 2013-07-01 MED ORDER — PHENYLEPHRINE 40 MCG/ML (10ML) SYRINGE FOR IV PUSH (FOR BLOOD PRESSURE SUPPORT)
PREFILLED_SYRINGE | INTRAVENOUS | Status: AC
  Filled 2013-07-01: qty 5

## 2013-07-01 MED ORDER — MEPERIDINE HCL 25 MG/ML IJ SOLN: 6.2500 mg | INTRAMUSCULAR | Status: DC | PRN

## 2013-07-01 MED ORDER — PHENYLEPHRINE HCL 10 MG/ML IJ SOLN
INTRAMUSCULAR | Status: DC | PRN
  Administered 2013-07-01 (×3): 80 ug via INTRAVENOUS
  Administered 2013-07-01: 40 ug via INTRAVENOUS
  Administered 2013-07-01: 80 ug via INTRAVENOUS
  Administered 2013-07-01: 40 ug via INTRAVENOUS

## 2013-07-01 MED ORDER — ACETAMINOPHEN 325 MG PO TABS: 650.0000 mg | ORAL_TABLET | ORAL | Status: DC | PRN

## 2013-07-01 MED ORDER — LACTATED RINGERS IV SOLN
500.0000 mL | Freq: Once | INTRAVENOUS | Status: AC
  Administered 2013-07-01: 500 mL via INTRAVENOUS

## 2013-07-01 MED ORDER — PENICILLIN G POTASSIUM 5000000 UNITS IJ SOLR
5.0000 10*6.[IU] | Freq: Once | INTRAVENOUS | Status: AC
  Administered 2013-07-01: 5 10*6.[IU] via INTRAVENOUS
  Filled 2013-07-01: qty 5

## 2013-07-01 MED ORDER — MORPHINE SULFATE (PF) 0.5 MG/ML IJ SOLN
INTRAMUSCULAR | Status: DC | PRN
  Administered 2013-07-01: 1 mg via INTRAVENOUS
  Administered 2013-07-01: 4 mg via EPIDURAL

## 2013-07-01 MED ORDER — LACTATED RINGERS IV SOLN
INTRAVENOUS | Status: DC
  Administered 2013-07-01 (×2): via INTRAVENOUS

## 2013-07-01 MED ORDER — OXYTOCIN 40 UNITS IN LACTATED RINGERS INFUSION - SIMPLE MED
1.0000 m[IU]/min | INTRAVENOUS | Status: DC
  Administered 2013-07-01: 2 m[IU]/min via INTRAVENOUS
  Filled 2013-07-01: qty 1000

## 2013-07-01 MED ORDER — METOCLOPRAMIDE HCL 5 MG/ML IJ SOLN
INTRAMUSCULAR | Status: AC
  Filled 2013-07-01: qty 2

## 2013-07-01 MED ORDER — DEXTROSE 50 % IV SOLN: 25.0000 mL | INTRAVENOUS | Status: DC | PRN

## 2013-07-01 MED ORDER — CITRIC ACID-SODIUM CITRATE 334-500 MG/5ML PO SOLN
30.0000 mL | ORAL | Status: DC | PRN
  Administered 2013-07-01: 30 mL via ORAL
  Filled 2013-07-01: qty 15

## 2013-07-01 MED ORDER — PHENYLEPHRINE 40 MCG/ML (10ML) SYRINGE FOR IV PUSH (FOR BLOOD PRESSURE SUPPORT)
80.0000 ug | PREFILLED_SYRINGE | INTRAVENOUS | Status: DC | PRN
  Filled 2013-07-01: qty 10

## 2013-07-01 MED ORDER — KETOROLAC TROMETHAMINE 30 MG/ML IJ SOLN
30.0000 mg | Freq: Once | INTRAMUSCULAR | Status: AC
  Administered 2013-07-01: 30 mg via INTRAVENOUS

## 2013-07-01 MED ORDER — INSULIN REGULAR BOLUS VIA INFUSION
0.0000 [IU] | Freq: Three times a day (TID) | INTRAVENOUS | Status: DC
  Filled 2013-07-01: qty 10

## 2013-07-01 MED ORDER — SODIUM CHLORIDE 0.9 % IV SOLN: INTRAVENOUS | Status: DC

## 2013-07-01 MED ORDER — ONDANSETRON HCL 4 MG/2ML IJ SOLN
4.0000 mg | Freq: Four times a day (QID) | INTRAMUSCULAR | Status: DC | PRN
Start: 2013-07-01 — End: 2013-07-01

## 2013-07-01 MED ORDER — DIPHENHYDRAMINE HCL 50 MG/ML IJ SOLN: 12.5000 mg | INTRAMUSCULAR | Status: DC | PRN

## 2013-07-01 MED ORDER — EPHEDRINE 5 MG/ML INJ
10.0000 mg | INTRAVENOUS | Status: DC | PRN
  Administered 2013-07-01: 10 mg via INTRAVENOUS

## 2013-07-01 MED ORDER — METOCLOPRAMIDE HCL 5 MG/ML IJ SOLN
INTRAMUSCULAR | Status: DC | PRN
  Administered 2013-07-01: 10 mg via INTRAVENOUS

## 2013-07-01 MED ORDER — MORPHINE SULFATE 0.5 MG/ML IJ SOLN
INTRAMUSCULAR | Status: AC
  Filled 2013-07-01: qty 10

## 2013-07-01 MED ORDER — KETOROLAC TROMETHAMINE 30 MG/ML IJ SOLN
INTRAMUSCULAR | Status: AC
  Filled 2013-07-01: qty 1

## 2013-07-01 MED ORDER — LACTATED RINGERS IV SOLN
INTRAVENOUS | Status: DC | PRN
  Administered 2013-07-01: 21:00:00 via INTRAVENOUS

## 2013-07-01 MED ORDER — FENTANYL 2.5 MCG/ML BUPIVACAINE 1/10 % EPIDURAL INFUSION (WH - ANES)
14.0000 mL/h | INTRAMUSCULAR | Status: DC | PRN
  Administered 2013-07-01 (×2): 14 mL/h via EPIDURAL
  Filled 2013-07-01 (×2): qty 125

## 2013-07-01 MED ORDER — LACTATED RINGERS IV SOLN
INTRAVENOUS | Status: DC | PRN
  Administered 2013-07-01 (×2): via INTRAVENOUS

## 2013-07-01 MED ORDER — SCOPOLAMINE 1 MG/3DAYS TD PT72: 1.0000 | MEDICATED_PATCH | Freq: Once | TRANSDERMAL | Status: DC

## 2013-07-01 MED ORDER — PHENYLEPHRINE 40 MCG/ML (10ML) SYRINGE FOR IV PUSH (FOR BLOOD PRESSURE SUPPORT)
80.0000 ug | PREFILLED_SYRINGE | INTRAVENOUS | Status: AC | PRN
  Administered 2013-07-01 (×3): 80 ug via INTRAVENOUS

## 2013-07-01 MED ORDER — TERBUTALINE SULFATE 1 MG/ML IJ SOLN: 0.2500 mg | Freq: Once | INTRAMUSCULAR | Status: DC | PRN

## 2013-07-01 MED ORDER — CEFAZOLIN SODIUM-DEXTROSE 2-3 GM-% IV SOLR
INTRAVENOUS | Status: DC | PRN
  Administered 2013-07-01: 2 g via INTRAVENOUS

## 2013-07-01 MED ORDER — DEXTROSE-NACL 5-0.45 % IV SOLN
INTRAVENOUS | Status: DC
  Administered 2013-07-01 (×2): via INTRAVENOUS

## 2013-07-01 MED ORDER — 0.9 % SODIUM CHLORIDE (POUR BTL) OPTIME
TOPICAL | Status: DC | PRN
  Administered 2013-07-01: 1000 mL

## 2013-07-01 MED ORDER — ACETAMINOPHEN 500 MG PO TABS
1000.0000 mg | ORAL_TABLET | Freq: Four times a day (QID) | ORAL | Status: DC | PRN
  Administered 2013-07-01: 1000 mg via ORAL
  Filled 2013-07-01: qty 2

## 2013-07-01 MED ORDER — LIDOCAINE HCL (PF) 1 % IJ SOLN: 30.0000 mL | INTRAMUSCULAR | Status: DC | PRN

## 2013-07-01 MED ORDER — PENICILLIN G POTASSIUM 5000000 UNITS IJ SOLR
2.5000 10*6.[IU] | INTRAMUSCULAR | Status: DC
  Administered 2013-07-01 (×2): 2.5 10*6.[IU] via INTRAVENOUS
  Filled 2013-07-01 (×5): qty 2.5

## 2013-07-01 MED ORDER — FLEET ENEMA 7-19 GM/118ML RE ENEM: 1.0000 | ENEMA | RECTAL | Status: DC | PRN

## 2013-07-01 MED ORDER — HYDROMORPHONE HCL PF 1 MG/ML IJ SOLN
0.2500 mg | INTRAMUSCULAR | Status: DC | PRN
  Administered 2013-07-01: 0.5 mg via INTRAVENOUS

## 2013-07-01 MED ORDER — LIDOCAINE-EPINEPHRINE (PF) 2 %-1:200000 IJ SOLN
INTRAMUSCULAR | Status: DC | PRN
  Administered 2013-07-01: 10 mL via EPIDURAL

## 2013-07-01 MED ORDER — GENTAMICIN SULFATE 40 MG/ML IJ SOLN
140.0000 mg | Freq: Three times a day (TID) | INTRAVENOUS | Status: DC
  Administered 2013-07-01: 140 mg via INTRAVENOUS
  Filled 2013-07-01 (×2): qty 3.5

## 2013-07-01 MED ORDER — LACTATED RINGERS IV SOLN
INTRAVENOUS | Status: DC
  Administered 2013-07-01: 11:00:00 via INTRAUTERINE

## 2013-07-01 MED ORDER — ONDANSETRON HCL 4 MG/2ML IJ SOLN
INTRAMUSCULAR | Status: DC | PRN
  Administered 2013-07-01: 4 mg via INTRAVENOUS

## 2013-07-01 SURGICAL SUPPLY — 37 items
APL SKNCLS STERI-STRIP NONHPOA (GAUZE/BANDAGES/DRESSINGS) ×1
BENZOIN TINCTURE PRP APPL 2/3 (GAUZE/BANDAGES/DRESSINGS) ×2 IMPLANT
CLAMP CORD UMBIL (MISCELLANEOUS) ×1 IMPLANT
CLOTH BEACON ORANGE TIMEOUT ST (SAFETY) ×2 IMPLANT
DRAPE LG THREE QUARTER DISP (DRAPES) IMPLANT
DRSG OPSITE POSTOP 4X10 (GAUZE/BANDAGES/DRESSINGS) ×2 IMPLANT
DURAPREP 26ML APPLICATOR (WOUND CARE) ×2 IMPLANT
ELECT REM PT RETURN 9FT ADLT (ELECTROSURGICAL) ×2
ELECTRODE REM PT RTRN 9FT ADLT (ELECTROSURGICAL) ×1 IMPLANT
EXTRACTOR VACUUM KIWI (MISCELLANEOUS) IMPLANT
GLOVE BIO SURGEON ST LM GN SZ9 (GLOVE) ×2 IMPLANT
GLOVE BIOGEL PI IND STRL 9 (GLOVE) ×1 IMPLANT
GLOVE BIOGEL PI INDICATOR 9 (GLOVE) ×1
GOWN PREVENTION PLUS XLARGE (GOWN DISPOSABLE) ×2 IMPLANT
GOWN STRL REIN 3XL LVL4 (GOWN DISPOSABLE) ×2 IMPLANT
GOWN STRL REIN XL XLG (GOWN DISPOSABLE) IMPLANT
NDL HYPO 25X5/8 SAFETYGLIDE (NEEDLE) IMPLANT
NEEDLE HYPO 25X5/8 SAFETYGLIDE (NEEDLE) ×2 IMPLANT
NS IRRIG 1000ML POUR BTL (IV SOLUTION) ×2 IMPLANT
PACK C SECTION WH (CUSTOM PROCEDURE TRAY) ×2 IMPLANT
PAD OB MATERNITY 4.3X12.25 (PERSONAL CARE ITEMS) ×2 IMPLANT
RETRACTOR WND ALEXIS 25 LRG (MISCELLANEOUS) IMPLANT
RTRCTR C-SECT PINK 25CM LRG (MISCELLANEOUS) IMPLANT
RTRCTR WOUND ALEXIS 25CM LRG (MISCELLANEOUS)
SPONGE LAP 18X18 X RAY DECT (DISPOSABLE) ×1 IMPLANT
STRIP CLOSURE SKIN 1/2X4 (GAUZE/BANDAGES/DRESSINGS) ×1 IMPLANT
SUT MON AB-0 CT1 36 (SUTURE) ×2 IMPLANT
SUT VIC AB 0 CT1 27 (SUTURE) ×2
SUT VIC AB 0 CT1 27XBRD ANBCTR (SUTURE) ×1 IMPLANT
SUT VIC AB 0 CTB1 36 (SUTURE) ×4 IMPLANT
SUT VIC AB 2-0 CT1 27 (SUTURE) ×4
SUT VIC AB 2-0 CT1 TAPERPNT 27 (SUTURE) ×2 IMPLANT
SUT VIC AB 4-0 KS 27 (SUTURE) ×2 IMPLANT
SYR BULB IRRIGATION 50ML (SYRINGE) IMPLANT
TOWEL OR 17X24 6PK STRL BLUE (TOWEL DISPOSABLE) ×2 IMPLANT
TRAY FOLEY CATH 14FR (SET/KITS/TRAYS/PACK) ×1 IMPLANT
WATER STERILE IRR 1000ML POUR (IV SOLUTION) ×1 IMPLANT

## 2013-07-01 NOTE — Progress Notes (Addendum)
Wanda Harris is a 33 y.o. [phone] at [redacted]w[redacted]d.   Subjective: Comfortable w/ epidural.   Objective: BP 100/54  Pulse 57  Temp(Src) 98.3 F (36.8 C) (Oral)  Resp 18  Ht 5' (1.524 m)  Wt 84.369 kg (186 lb)  BMI 36.33 kg/m2  SpO2 100%  LMP 10/05/2012      FHT:  FHR: 155 bpm, variability: minimal ,  accelerations:  Abscent,  decelerations:  Present few lates and mild variables UC:   irregular, every 2-5 minutes SVE:   Dilation: 5 Effacement (%): 80 Station: -2 Presentation: Vertex Exam by:: J.Thornton, RN   Labs: Lab Results  Component Value Date   WBC 11.4* 07/01/2013   HGB 10.5* 07/01/2013   HCT 31.8* 07/01/2013   MCV 69.9* 07/01/2013   PLT 279 07/01/2013    Assessment / Plan: Spontaneous labor, progressing normally  Labor: Progressing normally Preeclampsia:  NA Fetal Wellbeing:  Category II Pain Control:  Epidural I/D:  n/a Anticipated MOD:  NSVD if FHR remains reassuring.  RN started IV bolus and O2.   Deniyah Dillavou 07/01/2013, 7:59 AM

## 2013-07-01 NOTE — Progress Notes (Signed)
CSW received consult for patient who states she cannot afford her medications.  CSW is not able to assist with this type of assistance and passed information along to Care Management Department, who may be able to assist.

## 2013-07-01 NOTE — Transfer of Care (Signed)
Immediate Anesthesia Transfer of Care Note  Patient: Wanda Harris  Procedure(s) Performed: Procedure(s): Primary Cesarean Section Delivery Baby Boy @ 2038, Apgars 7/9 (N/A)  Patient Location: PACU  Anesthesia Type:Epidural  Level of Consciousness: awake, alert  and oriented  Airway & Oxygen Therapy: Patient Spontanous Breathing  Post-op Assessment: Report given to PACU RN and Post -op Vital signs reviewed and stable  Post vital signs: Reviewed and stable  Complications: No apparent anesthesia complications

## 2013-07-01 NOTE — Progress Notes (Signed)
ANTIBIOTIC CONSULT NOTE - INITIAL  Pharmacy Consult for Gentamicin Indication: Chorioamnionitis   No Known Allergies  Patient Measurements: Height: 5' (152.4 cm) Weight: 186 lb (84.369 kg) IBW/kg (Calculated) : 45.5 Adjusted Body Weight: 57.2kg  Vital Signs: Temp: 100.9 F (38.3 C) (01/07 1900) Temp src: Oral (01/07 1900) BP: 139/72 mmHg (01/07 1930) Pulse Rate: 72 (01/07 1930)  Labs:  Recent Labs  07/01/13 0530  WBC 11.4*  HGB 10.5*  PLT 279   No results found for this basename: GENTTROUGH, GENTPEAK, GENTRANDOM,  in the last 72 hours   Microbiology: Recent Results (from the past 720 hour(s))  URINE CULTURE     Status: None   Collection Time    06/07/13  1:05 AM      Result Value Range Status   Specimen Description URINE, CLEAN CATCH   Final   Special Requests NONE   Final   Culture  Setup Time     Final   Value: 06/07/2013 15:57     Performed at [facility]     Final   Value: 10,000 COLONIES/ML     Performed at [facility]   Culture     Final   Value: Multiple bacterial morphotypes present, none predominant. Suggest appropriate recollection if clinically indicated.     Performed at [facility]   Report Status 06/08/2013 FINAL   Final  GC/CHLAMYDIA PROBE AMP     Status: None   Collection Time    06/08/13 10:28 AM      Result Value Range Status   CT Probe RNA NEGATIVE   Final   GC Probe RNA NEGATIVE   Final   Comment:                                                                                             **Normal Reference Range: Negative**                 Assay performed using the Gen-Probe APTIMA COMBO2 (R) Assay.           Acceptable specimen types for this assay include APTIMA Swabs (Unisex,     endocervical, urethral, or vaginal), first void urine, and ThinPrep     liquid based cytology samples.  WET PREP, GENITAL     Status: Abnormal   Collection Time    06/22/13 10:04 AM      Result Value Range  Status   Yeast Wet Prep HPF POC MANY (*) NONE SEEN Final   Trich, Wet Prep NONE SEEN  NONE SEEN Final   Clue Cells Wet Prep HPF POC FEW (*) NONE SEEN Final   WBC, Wet Prep HPF POC FEW  NONE SEEN Final  GC/CHLAMYDIA PROBE AMP     Status: None   Collection Time    06/22/13 10:05 AM      Result Value Range Status   CT Probe RNA NEGATIVE   Final   GC Probe RNA NEGATIVE   Final   Comment:                                                                                             **  Normal Reference Range: Negative**                 Assay performed using the Gen-Probe APTIMA COMBO2 (R) Assay.           Acceptable specimen types for this assay include APTIMA Swabs (Unisex,     endocervical, urethral, or vaginal), first void urine, and ThinPrep     liquid based cytology samples.  CULTURE, OB URINE     Status: None   Collection Time    06/22/13 10:06 AM      Result Value Range Status   Colony Count 50,000 COLONIES/ML   Final   Organism ID, Bacteria Multiple bacterial morphotypes present, none   Final   Organism ID, Bacteria predominant. Suggest appropriate recollection if    Final   Organism ID, Bacteria clinically indicated.   Final  WET PREP, GENITAL     Status: Abnormal   Collection Time    06/30/13  3:30 PM      Result Value Range Status   Yeast Wet Prep HPF POC NONE SEEN  NONE SEEN Final   Trich, Wet Prep NONE SEEN  NONE SEEN Final   Clue Cells Wet Prep HPF POC NONE SEEN  NONE SEEN Final   WBC, Wet Prep HPF POC MANY (*) NONE SEEN Final   Comment: MODERATE BACTERIA SEEN    Medication: PCN per GBS regimen  Assessment: 33 y.o. female [id number] at [redacted]w[redacted]d with maternal temp Estimated Ke = 0.253, Vd = 0.4L/kg  Goal of Therapy:  Gentamicin peak 6-8 mg/L and Trough < 1 mg/L  Plan:  Gentamicin 140 mg IV every 8 hrs  Check Scr with next labs if gentamicin continued. Will check gentamicin levels if continued > 72hr or clinically indicated.  Joetta Manners D 07/01/2013,7:40  PM

## 2013-07-01 NOTE — Progress Notes (Signed)
`````  Attestation of Attending Supervision of Advanced Practitioner: Evaluation and management procedures were performed by the PA/NP/CNM/OB Fellow under my supervision/collaboration. Chart reviewed and agree with management and plan.  Ciin Brazzel V 07/01/2013 10:30 PM

## 2013-07-01 NOTE — MAU Note (Signed)
Patient is here for AM induction of labor. Patient transported to birthing suites room 169 per [facility] RN. Patient not seen in MAU.

## 2013-07-01 NOTE — Progress Notes (Signed)
Wanda Harris is a 33 y.o. [phone] at [redacted]w[redacted]d by ultrasound admitted for induction of labor due to Diabetes A-2.  Subjective: Patient has  IUPC and amnioinfusion in place.  FSE has come loose and requires replacement.  CBG (last 3)   Recent Labs  07/01/13 1550 07/01/13 1644 07/01/13 1746  GLUCAP 77 77 97      Objective: BP 81/43  Pulse 72  Temp(Src) 98.7 F (37.1 C) (Oral)  Resp 20  Ht 5' (1.524 m)  Wt 84.369 kg (186 lb)  BMI 36.33 kg/m2  SpO2 100%  LMP 10/05/2012   Total I/O In: -  Out: 1000 [Urine:1000]  FHT:  FHR: 140 bpm, variability: minimal ,  accelerations:  Present,  decelerations:  Present several variables, less than 30 secs in duration , prompt recovery UC:   regular, every 2-3 minutes SVE:   Dilation: Lip/rim Effacement (%): 80 Station: -2 Exam by:: Dr. Glo Herring Cervix is 7 cm with a thick anterior lip.,/-2 vertex. The foley bulb has been below the vertex, and could be slid past the vertex between contractions and is staying high above the vertex. Ultrasound at bedside shows the infant is left occiput transverse, with good fluid in front of face from amnioinfusion. Vertex appears somewhat military in position, and is NOT very deep into the pelvis despite quite a bit of caput, and some moulding. Labs: Lab Results  Component Value Date   WBC 11.4* 07/01/2013   HGB 10.5* 07/01/2013   HCT 31.8* 07/01/2013   MCV 69.9* 07/01/2013   PLT 279 07/01/2013    Assessment / Plan: Induction of labor due to gestational diabetes,  progressing well on pitocin , suboptimal progress, adequate labor  Labor: suboptimal progress, will monitor closely Preeclampsia:   Fetal Wellbeing:  Category II Pain Control:  Epidural I/D:  n/a Anticipated MOD:  uncertain, likelihood of cesarean due to CPD or NRFHT is higher than most  Wanda Harris V 07/01/2013, 6:06 PM

## 2013-07-01 NOTE — Progress Notes (Signed)
Patient ID: Wanda Harris, female   DOB: 1980/12/28, 33 y.o.   MRN: [id number]  Continues to have minimal variability and a mixture of variable and late decels  Several maneuvers tried including position change, manipulation of cervix, scalp stim and oxygen  UCs irregular   Dr Roselie Awkward aware of tracing  Will continue to observe

## 2013-07-01 NOTE — Progress Notes (Addendum)
Wanda Harris is a 34 y.o. [phone] at [redacted]w[redacted]d admitted for induction of labor due to Diabetes., rupture of membranes  Subjective:   Objective: BP 110/71  Pulse 66  Temp(Src) 98.3 F (36.8 C) (Oral)  Resp 18  Ht 5' (1.524 m)  Wt 84.369 kg (186 lb)  BMI 36.33 kg/m2  SpO2 100%  LMP 10/05/2012      FHT:  FHR: 150 bpm, variability: moderate,  accelerations:  Present,  decelerations:  Present variable UC:   regular, every 1-2 minutes SVE:   Dilation: 7 Effacement (%): 80 Station: -2 Exam by: Wanda Harris and RN  Labs: Lab Results  Component Value Date   WBC 11.4* 07/01/2013   HGB 10.5* 07/01/2013   HCT 31.8* 07/01/2013   MCV 69.9* 07/01/2013   PLT 279 07/01/2013    Assessment / Plan: Augmentation of labor, progressing well  Labor: Progressing normally Preeclampsia:  no signs or symptoms of toxicity Fetal Wellbeing:  Category II Pain Control:  Epidural I/D:  n/a Anticipated MOD:  NSVD  Wanda Harris 07/01/2013, 3:50 PM

## 2013-07-01 NOTE — Progress Notes (Signed)
CRNA called to start second IV site for insulin drip.  Two attempts done by [facility], RN, two attempts done by Terence Lux, RN and one attempt done by Mare Ferrari, RN.

## 2013-07-01 NOTE — Progress Notes (Signed)
Notified anesthesiologist about pt low BPs and interventions--orders to given 2cc of ephedrine

## 2013-07-01 NOTE — Op Note (Signed)
07/01/2013  9:25 PM  PATIENT:  Wanda Harris  33 y.o. female  PRE-OPERATIVE DIAGNOSIS:  Nonreassuring Fetal Heart Rate , cephalopelvic disproportion  POST-OPERATIVE DIAGNOSIS:  Nonreassuring Fetal Heart Rate, cephalopelvic disproportion  PROCEDURE:  Procedure(s): Primary Cesarean Section Delivery Baby Boy @ 2038, Apgars 7/9 (N/A)  SURGEON:  Surgeon(s) and Role:    * Jonnie Kind, MD - Primary, Odom MD  PHYSICIAN ASSISTANT:   LEEP cautery Details of procedure: Patient was taken operating room Pitocin discontinued. Fetal heart was documented in the normal range operating room. Abdomen was prepped and draped. Timeout was conducted and procedure confirmed by surgical team. Ancef and gentamicin administered preoperatively. Transverse lower abdominal incision was made and the method of Pfannenstiel with 5 developed and transverse uterine incision made and the fetal vertex rotated from the left aspect transverse position into the incision and the infant delivered with fundal pressure. There was technically challenging to deliver the shoulders to the large fetal size, and care was taken to use axillary traction and fundal pressure for expulsion of the infant appeared was clamped and the infant passed to waiting pediatrician for care. See their notes for details. Placenta delivered in response to crede uterine massage, and good uterine tone obtained in response to IV oxytocin. The incision was inspected and showed no extension into the uterine vessels. There were large venous bleeding sites in the center midline of the incision coming from the upper side of the incision as well as the lower side. The uterus was irrigated with saline solution, then closed in a running locking first layer of 0 Vicryl in a continuous running locking second layer. The first layer was sewn from each corner to the midline. A vertical mattress suture was placed in the midline above and below the uterine incision to complete  hemostasis, which was quite good abdomen was irrigated, the anterior peritoneum closed running 2-0 Vicryl, the muscles pulled together with a single interrupted suture of 2-0 Vicryl, then the fascia closed with continuous running 0 Vicryl. And subcutaneous tissues irrigated with saline solution before closure with a single interrupted suture in the midline for reapproximation of subcutaneous fatty tissue followed by subcuticular 4-0 Vicryl closure and skin incision. Sponge and needle counts correct

## 2013-07-01 NOTE — Progress Notes (Signed)
Clarified IV fluid order for patients--per CNM orders to run LR at 76ml/hr and D5 at 134ml/hr

## 2013-07-01 NOTE — Progress Notes (Signed)
FHR nonreactive, 160 with occasional late decel. Minimal variability   UCs q 4-8 min   Dr Roselie Awkward updated on status  Will try amnioinfusion

## 2013-07-01 NOTE — H&P (Signed)
HPI: Wanda Harris is a 33 y.o. year old [id number] female at [redacted]w[redacted]d weeks gestation by 23 wee [place] who presents for leaking green fluid since 0400 and contractions. Received care at [facility] for A2GDM (possible pre-gestational), poorly controlled. On Metformin and Glyburide, then switched to NPH and Novolog at 32 weeks.    Maternal Medical History:  Reason for admission: Rupture of membranes and contractions.  Nausea.    Clinic HR  Dating LMP/Ultrasound: [redacted]w[redacted]d  weeks        Ultrasound consistent with LMP: Yes/No (4d variation CRL)  Genetic Screen 1 Screen:     Nl 01/02/13     AFP:  Canceled by Candis Musa   Anatomic [place] Nl anatomy with right pyelectasis- Grade 2 Hydronephrosis  GTT Early:    205             TDaP vaccine 11/14  Flu vaccine 03/30/13  GBS In urine--treat in labor  Baby Food  Breast  Contraception  Nexplanon?   Circumcision   Pediatrician    Patient Active Problem List   Diagnosis Date Noted  . Abnormal fetal ultrasound: Right grade 2 hydronephrosis: Per Dr. Sharlet Salina "recommend consideration for delivery between 39-40 weeks; Notify Peds at time of delivery - will need ultrasound evaluation of the newborn after delivery and likely Peds urology consultation during the first several weeks after birth. 04/01/2013  . GBS (group B streptococcus) UTI complicating pregnancy 56/38/7564  . Class B gestational diabetes mellitus 01/16/2013  . First trimester bleeding 01/16/2013  . Supervision of high risk pregnancy 01/15/2013   OB History   Grav Para Term Preterm Abortions TAB SAB Ect Mult Living   5 3 3  1 1    3      Past Medical History  Diagnosis Date  . Diabetes mellitus without complication     GDM  . Gestational diabetes     G2 & G5, takes insulin  . Asthma     does not use inhaler   Past Surgical History  Procedure Laterality Date  . No past surgeries     Family History: family history includes Arthritis in her mother; Diabetes in her mother; Hyperlipidemia in her father;  Hypertension in her mother. Social History:  reports that she has never smoked. She has never used smokeless tobacco. She reports that she does not drink alcohol or use illicit drugs.   Prenatal Transfer Tool  Maternal Diabetes: Yes:  Diabetes Type:  Insulin/Medication controlled Genetic Screening: Normal Maternal Ultrasounds/Referrals: Abnormal:  Findings:   Fetal Kidney Anomalies Fetal Ultrasounds or other Referrals:  Referred to Materal Fetal Medicine  Maternal Substance Abuse:  No Significant Maternal Medications:  Meds include: Other: Insulin Significant Maternal Lab Results:  Lab values include: Group B Strep positive Other Comments:  Right grade 2 hydronephrosis: Per Dr. Sharlet Salina "recommend consideration for delivery between 39-40 weeks; Notify Peds at time of delivery - will need ultrasound evaluation of the newborn after delivery and likely Peds urology consultation during the first several weeks after birth." MSF  Review of Systems  Constitutional: Negative for fever and chills.  Eyes: Negative for blurred vision and double vision.  Cardiovascular: Negative for chest pain.  Gastrointestinal: Positive for abdominal pain (contractions). Negative for nausea and vomiting.  Neurological: Negative for headaches.    Dilation: 4.5 Effacement (%): 70 Station: -3 Exam by:: Nyiesha Beever CNM Blood pressure 112/68, pulse 68, temperature 98.3 F (36.8 C), temperature source Oral, resp. rate 20, height 5' (1.524 m), weight 84.369 kg (186  lb), last menstrual period 10/05/2012. Maternal Exam:  Uterine Assessment: Contraction strength is moderate.  Contraction frequency is regular.   Abdomen: Estimated fetal weight is 8 lb on 06/26/2013.   Fetal presentation: vertex  Introitus: Normal vulva. Amniotic fluid character: meconium stained. Grossly ruptured moderate MSF  Pelvis: adequate for delivery.   Cervix: Cervix evaluated by digital exam.     Fetal Exam Fetal Monitor Review: Mode: ultrasound.    Baseline rate: 145.  Variability: minimal (<5 bpm).   Pattern: no accelerations and no decelerations.    Fetal State Assessment: Category II - tracings are indeterminate.     Physical Exam  Nursing note and vitals reviewed. Constitutional: She is oriented to person, place, and time. She appears well-developed and well-nourished. She appears distressed.  HENT:  Head: Normocephalic.  Eyes: Conjunctivae are normal.  Cardiovascular: Normal rate, regular rhythm and normal heart sounds.   Respiratory: Effort normal and breath sounds normal.  GI: Soft. There is no tenderness.  Musculoskeletal: Normal range of motion. She exhibits no edema and no tenderness.  Neurological: She is alert and oriented to person, place, and time. She has normal reflexes.  Skin: Skin is warm and dry.  Psychiatric: She has a normal mood and affect.    Prenatal labs: ABO, Rh: O/POS/-- (07/22 1622) Antibody: NEG (07/22 1622) Rubella: 2.76 (07/22 1622) RPR: NON REAC (07/22 1622)  HBsAg: NEGATIVE (07/22 1622)  HIV: NON REACTIVE (07/22 1622)  GBS: Positive (07/22 0000) Pos urine 1st trimester screen normal Early GTT 205  Results for orders placed during the hospital encounter of 07/01/13 (from the past 24 hour(s))  GLUCOSE, CAPILLARY     Status: None   Collection Time    07/01/13  5:30 AM      Result Value Range   Glucose-Capillary 98  70 - 99 mg/dL  CBC     Status: Abnormal   Collection Time    07/01/13  5:30 AM      Result Value Range   WBC 11.4 (*) 4.0 - 10.5 K/uL   RBC 4.55  3.87 - 5.11 MIL/uL   Hemoglobin 10.5 (*) 12.0 - 15.0 g/dL   HCT 31.8 (*) 36.0 - 46.0 %   MCV 69.9 (*) 78.0 - 100.0 fL   MCH 23.1 (*) 26.0 - 34.0 pg   MCHC 33.0  30.0 - 36.0 g/dL   RDW 16.3 (*) 11.5 - 15.5 %   Platelets 279  150 - 400 K/uL   Assessment: 1. Labor: Early 2. Fetal Wellbeing: Category II  3. Pain Control: requesting IV pain meds 4. GBS: Pos 5. 39.0 week IUP 6. Poorly controlled A2GDM.  Plan:  1. Admit  to BS per consult with MD 2. Routine L&D orders 3. FHR tracing not appropriate for IV pain meds at present. Epidural PRN. 4. PCN  [place], [place] 07/01/2013, 6:22 AM

## 2013-07-01 NOTE — Anesthesia Postprocedure Evaluation (Signed)
  Anesthesia Post-op Note  Patient: Wanda Harris  Procedure(s) Performed: Procedure(s): Primary Cesarean Section Delivery Baby Boy @ 2038, Apgars 7/9 (N/A)  Patient is awake, responsive, moving her legs, and has signs of resolution of her numbness. Pain and nausea are reasonably well controlled. Vital signs are stable and clinically acceptable. Oxygen saturation is clinically acceptable. There are no apparent anesthetic complications at this time. Patient is ready for discharge.

## 2013-07-01 NOTE — Progress Notes (Signed)
Patient ID: Wanda Harris, female   DOB: April 29, 1981, 33 y.o.   MRN: [id number] pidural  Comfortable with epidural  FHR continues with average variability and variable decels, some late UCs adequate per IUPC  SVE 9/90/0/vtx  Held anterior swollen cervix back and head moved down. Will continue to observe  Dr Glo Herring updated

## 2013-07-01 NOTE — Anesthesia Preprocedure Evaluation (Signed)
Anesthesia Evaluation  Patient identified by MRN, date of birth, ID band Patient awake    Reviewed: Allergy & Precautions, H&P , Patient's Chart, lab work & pertinent test results  Airway Mallampati: II TM Distance: >3 FB Neck ROM: full    Dental   Pulmonary  breath sounds clear to auscultation        Cardiovascular Rhythm:regular Rate:Normal     Neuro/Psych    GI/Hepatic   Endo/Other  diabetes  Renal/GU      Musculoskeletal   Abdominal   Peds  Hematology   Anesthesia Other Findings   Reproductive/Obstetrics (+) Pregnancy                           Anesthesia Physical Anesthesia Plan  ASA: III  Anesthesia Plan: Epidural   Post-op Pain Management:    Induction:   Airway Management Planned:   Additional Equipment:   Intra-op Plan:   Post-operative Plan:   Informed Consent: I have reviewed the patients History and Physical, chart, labs and discussed the procedure including the risks, benefits and alternatives for the proposed anesthesia with the patient or authorized representative who has indicated his/her understanding and acceptance.     Plan Discussed with:   Anesthesia Plan Comments:         Anesthesia Quick Evaluation

## 2013-07-01 NOTE — Progress Notes (Signed)
`````  Attestation of Attending Supervision of Advanced Practitioner: Evaluation and management procedures were performed by the PA/NP/CNM/OB Fellow under my supervision/collaboration. Chart reviewed and agree with management and plan.  Samule Life V 07/01/2013 10:29 PM

## 2013-07-01 NOTE — H&P (Signed)
Attestation of Attending Supervision of Advanced Practitioner (CNM/NP): Evaluation and management procedures were performed by the Advanced Practitioner under my supervision and collaboration.  I have reviewed the Advanced Practitioner's note and chart, and I agree with the management and plan.  Xee Hollman 07/01/2013 7:40 AM

## 2013-07-01 NOTE — Anesthesia Procedure Notes (Signed)
Epidural Patient location during procedure: OB Start time: 07/01/2013 7:22 AM  Staffing Anesthesiologist: Rudean Curt Performed by: anesthesiologist   Preanesthetic Checklist Completed: patient identified, site marked, surgical consent, pre-op evaluation, timeout performed, IV checked, risks and benefits discussed and monitors and equipment checked  Epidural Patient position: sitting Prep: site prepped and draped and DuraPrep Patient monitoring: continuous pulse ox and blood pressure Approach: midline Injection technique: LOR air  Needle:  Needle type: Tuohy  Needle gauge: 17 G Needle length: 9 cm and 9 Needle insertion depth: 5 cm cm Catheter type: closed end flexible Catheter size: 19 Gauge Catheter at skin depth: 10 cm Test dose: negative  Assessment Events: blood not aspirated, injection not painful, no injection resistance, negative IV test and no paresthesia  Additional Notes Patient identified.  Risk benefits discussed including failed block, incomplete pain control, headache, nerve damage, paralysis, blood pressure changes, nausea, vomiting, reactions to medication both toxic or allergic, and postpartum back pain.  Patient expressed understanding and wished to proceed.  All questions were answered.  Sterile technique used throughout procedure and epidural site dressed with sterile barrier dressing. No paresthesia or other complications noted.The patient did not experience any signs of intravascular injection such as tinnitus or metallic taste in mouth nor signs of intrathecal spread such as rapid motor block. Please see nursing notes for vital signs.

## 2013-07-01 NOTE — Progress Notes (Signed)
CRNA at bedside for 2nd IV site for insulin infusion

## 2013-07-01 NOTE — Progress Notes (Signed)
Received a call from the CSW that there was a consult put in for medication needs. Nurse Care Manager verified insurance that patient has active Medicaid with [facility] # [id number].  Unable to assist with medications needs if patient has active Medicaid.  There should be only a $3 copay or no copay.  Gave this information to patient's RN - Janett Billow and asked her to call Nurse Case Manager for any other needs.

## 2013-07-01 NOTE — Progress Notes (Signed)
Wanda Harris is a 33 y.o. [phone] at [redacted]w[redacted]d by ultrasound admitted for active labor  Subjective: Comfortable with epidural  Objective: BP 101/52  Pulse 73  Temp(Src) 98.4 F (36.9 C) (Oral)  Resp 18  Ht 5' (1.524 m)  Wt 84.369 kg (186 lb)  BMI 36.33 kg/m2  SpO2 100%  LMP 10/05/2012      FHT:  FHR: 140 bpm, variability: minimal ,  accelerations:  Abscent,  decelerations:  Present Some late some variable/early UC:   irregular, every 5 minutes SVE:   Dilation: 5 Effacement (%): 70 Station: Ballotable Exam by:: M.Emilian Stawicki, CNM  Labs: Lab Results  Component Value Date   WBC 11.4* 07/01/2013   HGB 10.5* 07/01/2013   HCT 31.8* 07/01/2013   MCV 69.9* 07/01/2013   PLT 279 07/01/2013    Assessment / Plan: Protracted active phase, equivocal FHR tracing, hypotonic contractions  Labor: Hypotonic irregular contractions, no prograss due to this Preeclampsia:  on magnesium sulfate and no signs or symptoms of toxicity Fetal Wellbeing:  Category II Pain Control:  Epidural I/D:  n/a Anticipated MOD:  NSVD ISE and IUPC placed. Will begin Pitocin augmentation Other maneuvers in place including oxygen  [facility] 07/01/2013, 10:06 AM

## 2013-07-01 NOTE — Progress Notes (Signed)
Wanda Harris is a 33 y.o. [phone] at [redacted]w[redacted]d admitted for rupture of membranes  Subjective:   Objective: BP 81/43  Pulse 72  Temp(Src) 98.7 F (37.1 C) (Oral)  Resp 20  Ht 5' (1.524 m)  Wt 84.369 kg (186 lb)  BMI 36.33 kg/m2  SpO2 100%  LMP 10/05/2012   Total I/O In: -  Out: 1000 [Urine:1000]  FHT:  FHR: 160 bpm, variability: minimal ,  accelerations:  Abscent,  decelerations:  Present some variable, few late UC:   regular, every 1.5-2.5 minutes SVE:   Dilation: Lip/rim Effacement (%): 80 Station: -2 Exam by:: Dr. Glo Herring  Labs: Lab Results  Component Value Date   WBC 11.4* 07/01/2013   HGB 10.5* 07/01/2013   HCT 31.8* 07/01/2013   MCV 69.9* 07/01/2013   PLT 279 07/01/2013    Assessment / Plan: Augmentation of labor, progressing well  Labor: Progressing on Pitocin Preeclampsia:  no signs or symptoms of toxicity Fetal Wellbeing:  Category II Pain Control:  Epidural I/D:  n/a Anticipated MOD:  NSVD  Wanda Harris 07/01/2013, 6:12 PM

## 2013-07-01 NOTE — Progress Notes (Signed)
TANNY HARNACK is a 33 y.o. [phone] at [redacted]w[redacted]d by ultrasound admitted for active labor  Subjective: Comfortable with epidural   Objective: BP 110/71  Pulse 66  Temp(Src) 98.3 F (36.8 C) (Oral)  Resp 18  Ht 5' (1.524 m)  Wt 84.369 kg (186 lb)  BMI 36.33 kg/m2  SpO2 100%  LMP 10/05/2012      FHT:  FHR: 140 bpm, variability: moderate,  accelerations:  Present,  decelerations:  Present Now variable, no further late decels UC:   irregular, every 3 minutes SVE:   Dilation: 6.5 Effacement (%): 80 Station: -2 Exam by:: J.Thornton, RN  Labs: Lab Results  Component Value Date   WBC 11.4* 07/01/2013   HGB 10.5* 07/01/2013   HCT 31.8* 07/01/2013   MCV 69.9* 07/01/2013   PLT 279 07/01/2013    Assessment / Plan: Spontaneous labor, progressing normally  Labor: Progressing normally with Pitocin Augmentation Preeclampsia:  n/a Fetal Wellbeing:  Category II Pain Control:  Epidural I/D:  n/a Anticipated MOD:  NSVD  WILLIAMS,MARIE 07/01/2013, 4:09 PM

## 2013-07-01 NOTE — Brief Op Note (Signed)
07/01/2013  9:25 PM  PATIENT:  Wanda Harris  33 y.o. female  PRE-OPERATIVE DIAGNOSIS:  Nonreassuring Fetal Heart Rate , cephalopelvic disproportion  POST-OPERATIVE DIAGNOSIS:  Nonreassuring Fetal Heart Rate, cephalopelvic disproportion  PROCEDURE:  Procedure(s): Primary Cesarean Section Delivery Baby Boy @ 2038, Apgars 7/9 (N/A)  SURGEON:  Surgeon(s) and Role:    * Jonnie Kind, MD - Primary, Odom MD  PHYSICIAN ASSISTANT:   ASSISTANTS: none   ANESTHESIA:   epidural  EBL:  Total I/O In: 1000 [I.V.:1000] Out: 750 [Urine:100; Blood:650]  BLOOD ADMINISTERED:none  DRAINS: Urinary Catheter (Foley)   LOCAL MEDICATIONS USED:  NONE  SPECIMEN:  Source of Specimen:  placenta to L&D  DISPOSITION OF SPECIMEN:  PATHOLOGY  COUNTS:  YES  TOURNIQUET:  * No tourniquets in log *  DICTATION: .Dragon Dictation  PLAN OF CARE: pt with admit order in place  PATIENT DISPOSITION:  PACU - hemodynamically stable.   Delay start of Pharmacological VTE agent (>24hrs) due to surgical blood loss or risk of bleeding: not applicable

## 2013-07-02 ENCOUNTER — Encounter (HOSPITAL_COMMUNITY): Payer: Self-pay | Admitting: Obstetrics and Gynecology

## 2013-07-02 LAB — CBC
HCT: 26.7 % — ABNORMAL LOW (ref 36.0–46.0)
HEMOGLOBIN: 8.5 g/dL — AB (ref 12.0–15.0)
MCH: 22.6 pg — AB (ref 26.0–34.0)
MCHC: 31.8 g/dL (ref 30.0–36.0)
MCV: 71 fL — ABNORMAL LOW (ref 78.0–100.0)
Platelets: 215 10*3/uL (ref 150–400)
RBC: 3.76 MIL/uL — AB (ref 3.87–5.11)
RDW: 16.7 % — ABNORMAL HIGH (ref 11.5–15.5)
WBC: 16.5 10*3/uL — ABNORMAL HIGH (ref 4.0–10.5)

## 2013-07-02 LAB — GLUCOSE, CAPILLARY
GLUCOSE-CAPILLARY: 118 mg/dL — AB (ref 70–99)
GLUCOSE-CAPILLARY: 169 mg/dL — AB (ref 70–99)
GLUCOSE-CAPILLARY: 156 mg/dL — AB (ref 70–99)
Glucose-Capillary: 160 mg/dL — ABNORMAL HIGH (ref 70–99)

## 2013-07-02 LAB — BASIC METABOLIC PANEL
BUN: 10 mg/dL (ref 6–23)
CALCIUM: 8.2 mg/dL — AB (ref 8.4–10.5)
CO2: 23 mEq/L (ref 19–32)
CREATININE: 0.95 mg/dL (ref 0.50–1.10)
Chloride: 106 mEq/L (ref 96–112)
GFR calc Af Amer: >90 mL/min (ref >90)
GFR calc non Af Amer: 79 mL/min — ABNORMAL LOW (ref >90)
Glucose, Bld: 151 mg/dL — ABNORMAL HIGH (ref 70–99)
Potassium: 4.8 mEq/L (ref 3.7–5.3)
Sodium: 139 mEq/L (ref 137–147)

## 2013-07-02 MED ORDER — SIMETHICONE 80 MG PO CHEW
80.0000 mg | CHEWABLE_TABLET | Freq: Three times a day (TID) | ORAL | Status: DC
  Administered 2013-07-02 – 2013-07-04 (×7): 80 mg via ORAL
  Filled 2013-07-02 (×7): qty 1

## 2013-07-02 MED ORDER — SODIUM CHLORIDE 0.9 % IJ SOLN: 3.0000 mL | INTRAMUSCULAR | Status: DC | PRN

## 2013-07-02 MED ORDER — DIPHENHYDRAMINE HCL 25 MG PO CAPS: 25.0000 mg | ORAL_CAPSULE | ORAL | Status: DC | PRN

## 2013-07-02 MED ORDER — NALBUPHINE SYRINGE 5 MG/0.5 ML
5.0000 mg | INJECTION | INTRAMUSCULAR | Status: DC | PRN
  Filled 2013-07-02: qty 1

## 2013-07-02 MED ORDER — PRENATAL MULTIVITAMIN CH
1.0000 | ORAL_TABLET | Freq: Every day | ORAL | Status: DC
  Administered 2013-07-02 – 2013-07-04 (×3): 1 via ORAL
  Filled 2013-07-02 (×3): qty 1

## 2013-07-02 MED ORDER — LANOLIN HYDROUS EX OINT: 1.0000 "application " | TOPICAL_OINTMENT | CUTANEOUS | Status: DC | PRN

## 2013-07-02 MED ORDER — ONDANSETRON HCL 4 MG/2ML IJ SOLN: 4.0000 mg | INTRAMUSCULAR | Status: DC | PRN

## 2013-07-02 MED ORDER — ONDANSETRON HCL 4 MG PO TABS: 4.0000 mg | ORAL_TABLET | ORAL | Status: DC | PRN

## 2013-07-02 MED ORDER — NALOXONE HCL 0.4 MG/ML IJ SOLN: 0.4000 mg | INTRAMUSCULAR | Status: DC | PRN

## 2013-07-02 MED ORDER — DIBUCAINE 1 % RE OINT: 1.0000 "application " | TOPICAL_OINTMENT | RECTAL | Status: DC | PRN

## 2013-07-02 MED ORDER — DIPHENHYDRAMINE HCL 50 MG/ML IJ SOLN: 25.0000 mg | INTRAMUSCULAR | Status: DC | PRN

## 2013-07-02 MED ORDER — LACTATED RINGERS IV SOLN
INTRAVENOUS | Status: DC
  Administered 2013-07-02: 02:00:00 via INTRAVENOUS

## 2013-07-02 MED ORDER — SIMETHICONE 80 MG PO CHEW
80.0000 mg | CHEWABLE_TABLET | ORAL | Status: DC | PRN
  Administered 2013-07-02: 80 mg via ORAL
  Filled 2013-07-02: qty 1

## 2013-07-02 MED ORDER — DIPHENHYDRAMINE HCL 50 MG/ML IJ SOLN: 12.5000 mg | INTRAMUSCULAR | Status: DC | PRN

## 2013-07-02 MED ORDER — NALBUPHINE SYRINGE 5 MG/0.5 ML
5.0000 mg | INJECTION | INTRAMUSCULAR | Status: DC | PRN
Start: 2013-07-02 — End: 2013-07-04
  Filled 2013-07-02: qty 1

## 2013-07-02 MED ORDER — GENTAMICIN SULFATE 40 MG/ML IJ SOLN
140.0000 mg | Freq: Three times a day (TID) | INTRAVENOUS | Status: AC
  Administered 2013-07-02 (×3): 140 mg via INTRAVENOUS
  Filled 2013-07-02 (×3): qty 3.5

## 2013-07-02 MED ORDER — OXYCODONE-ACETAMINOPHEN 5-325 MG PO TABS
1.0000 | ORAL_TABLET | ORAL | Status: DC | PRN
  Administered 2013-07-02 – 2013-07-04 (×9): 2 via ORAL
  Filled 2013-07-02: qty 1
  Filled 2013-07-02 (×9): qty 2

## 2013-07-02 MED ORDER — DIPHENHYDRAMINE HCL 25 MG PO CAPS: 25.0000 mg | ORAL_CAPSULE | Freq: Four times a day (QID) | ORAL | Status: DC | PRN

## 2013-07-02 MED ORDER — ZOLPIDEM TARTRATE 5 MG PO TABS: 5.0000 mg | ORAL_TABLET | Freq: Every evening | ORAL | Status: DC | PRN

## 2013-07-02 MED ORDER — SENNOSIDES-DOCUSATE SODIUM 8.6-50 MG PO TABS
2.0000 | ORAL_TABLET | ORAL | Status: DC
Start: 2013-07-02 — End: 2013-07-04
  Administered 2013-07-02 – 2013-07-03 (×2): 2 via ORAL
  Filled 2013-07-02 (×2): qty 2

## 2013-07-02 MED ORDER — SIMETHICONE 80 MG PO CHEW
80.0000 mg | CHEWABLE_TABLET | ORAL | Status: DC
  Administered 2013-07-03 (×2): 80 mg via ORAL
  Filled 2013-07-02 (×2): qty 1

## 2013-07-02 MED ORDER — TETANUS-DIPHTH-ACELL PERTUSSIS 5-2.5-18.5 LF-MCG/0.5 IM SUSP: 0.5000 mL | Freq: Once | INTRAMUSCULAR | Status: DC

## 2013-07-02 MED ORDER — METOCLOPRAMIDE HCL 5 MG/ML IJ SOLN: 10.0000 mg | Freq: Three times a day (TID) | INTRAMUSCULAR | Status: DC | PRN

## 2013-07-02 MED ORDER — NALOXONE HCL 1 MG/ML IJ SOLN
1.0000 ug/kg/h | INTRAVENOUS | Status: DC | PRN
  Filled 2013-07-02: qty 2

## 2013-07-02 MED ORDER — IBUPROFEN 600 MG PO TABS
600.0000 mg | ORAL_TABLET | Freq: Four times a day (QID) | ORAL | Status: DC
  Administered 2013-07-02 – 2013-07-04 (×9): 600 mg via ORAL
  Filled 2013-07-02 (×9): qty 1

## 2013-07-02 MED ORDER — MENTHOL 3 MG MT LOZG: 1.0000 | LOZENGE | OROMUCOSAL | Status: DC | PRN

## 2013-07-02 MED ORDER — WITCH HAZEL-GLYCERIN EX PADS: 1.0000 "application " | MEDICATED_PAD | CUTANEOUS | Status: DC | PRN

## 2013-07-02 MED ORDER — DEXTROSE 5 % IV SOLN
1.0000 g | Freq: Three times a day (TID) | INTRAVENOUS | Status: AC
  Administered 2013-07-02 (×3): 1 g via INTRAVENOUS
  Filled 2013-07-02 (×3): qty 1

## 2013-07-02 MED ORDER — OXYTOCIN 40 UNITS IN LACTATED RINGERS INFUSION - SIMPLE MED: 62.5000 mL/h | INTRAVENOUS | Status: AC

## 2013-07-02 MED ORDER — ONDANSETRON HCL 4 MG/2ML IJ SOLN: 4.0000 mg | Freq: Three times a day (TID) | INTRAMUSCULAR | Status: DC | PRN

## 2013-07-02 NOTE — Progress Notes (Signed)
I have seen and examined this patient and I agree with the above. Pt is pumping. Wanda Harris 8:37 AM 07/02/2013

## 2013-07-02 NOTE — Progress Notes (Signed)
Subjective: Postpartum Day 1: Cesarean Delivery Patient reports tolerating PO. Foley not yet removed. Has not yet passed flatus or stool. No significant ambulation yet but able to stand without dizziness/lightheadedness  Objective: Vital signs in last 24 hours: Temp:  [98 F (36.7 C)-100.9 F (38.3 C)] 98 F (36.7 C) (01/08 0630) Pulse Rate:  [57-93] 75 (01/08 0630) Resp:  [12-21] 18 (01/08 0630) BP: (81-155)/(33-116) 84/54 mmHg (01/08 0630) SpO2:  [93 %-98 %] 94 % (01/08 0630)  Physical Exam:  General: alert, cooperative, appears stated age and no distress Lochia: appropriate Uterine Fundus: firm Incision: healing well, no significant drainage DVT Evaluation: No evidence of DVT seen on physical exam. No cords or calf tenderness. No significant calf/ankle edema.   Recent Labs  07/01/13 0530 07/02/13 0605  HGB 10.5* 8.5*  HCT 31.8* 26.7*    Assessment/Plan: Status post Cesarean section. Doing well postoperatively.  Continue current care. Baby in NICU for hypoglycemia, mom has not seen today.  [name] 07/02/2013, 7:51 AM

## 2013-07-02 NOTE — Progress Notes (Signed)
UR completed 

## 2013-07-02 NOTE — Lactation Note (Signed)
This note was copied from the chart of [facility]. Lactation Consultation Note     Initiala consult with this mom of a term baby, in the NICU, IDM, hypoglycemia. I assisted the mom with latching the baby for the first time, in the NICU. The baby was very fussly, and was more content just doing skin to skin. Mom tried both breasts, cross cradle, cradle and football. Mom will try again later today.  Basic teaching done on pumping for a NICU baby, prmeit setting and hand expression. Mom knows to ask for lactation assistance as needed. I will follow this family in the NICU. Mom is active with [facility],  Patient Name: Wanda Harris [id number] Date: 07/02/2013 Reason for consult: Initial assessment;NICU baby   Maternal Data Formula Feeding for Exclusion: Yes (baby in NICU) Infant to breast within first hour of birth: No Breastfeeding delayed due to:: Infant status Has patient been taught Hand Expression?: Yes Does the patient have breastfeeding experience prior to this delivery?: Yes  Feeding Feeding Type: Breast Fed  LATCH Score/Interventions Latch: Too sleepy or reluctant, no latch achieved, no sucking elicited. Intervention(s): Skin to skin  Audible Swallowing: None Intervention(s): Skin to skin;Hand expression  Type of Nipple: Everted at rest and after stimulation  Comfort (Breast/Nipple): Soft / non-tender     Hold (Positioning): No assistance needed to correctly position infant at breast.  LATCH Score: 6  Lactation Tools Discussed/Used Tools: Pump WIC Program: Yes (mom encouraged to call and add baby, and let them know she may need a DEP) Pump Review: Setup, frequency, and cleaning;Milk Storage;Other (comment) (hand expression reviewed with mom, teaching done on DEP) Initiated by:: bedside Rn   Consult Status Consult Status: Follow-up Date: 07/03/13 Follow-up type: In-patient    Tonna Corner 07/02/2013, 2:07 PM

## 2013-07-02 NOTE — Anesthesia Postprocedure Evaluation (Signed)
  Anesthesia Post-op Note  Patient: Wanda Harris  Procedure(s) Performed: Procedure(s): Primary Cesarean Section Delivery Baby Boy @ 2038, Apgars 7/9 (N/A)  Patient Location: Mother/Baby  Anesthesia Type:Epidural  Level of Consciousness: awake, alert  and oriented  Airway and Oxygen Therapy: Patient Spontanous Breathing  Post-op Pain: none  Post-op Assessment: Post-op Vital signs reviewed, Patient's Cardiovascular Status Stable, No headache, No backache, No residual numbness and No residual motor weakness  Post-op Vital Signs: Reviewed and stable  Complications: No apparent anesthesia complications

## 2013-07-03 MED ORDER — BISACODYL 10 MG RE SUPP
10.0000 mg | Freq: Every day | RECTAL | Status: DC | PRN
  Administered 2013-07-03: 10 mg via RECTAL
  Filled 2013-07-03: qty 1

## 2013-07-03 NOTE — Lactation Note (Signed)
This note was copied from the chart of [facility]. Lactation Consultation Note; Follow up visit with mom. She reports that she has tried to put baby to the breast but he won't latch on. Reports that she pumped 2 times yesterday but is not getting any milk and is getting discouraged about that. Encouraged to pump q 3 hours- 8 times/day to promote milk supply. Experienced BF mom- nursed 3 previous children. Plans to get pump from [facility]- encouraged to call them today. No questions at present. To call prn.   Patient Name: Wanda Harris [id number] Date: 07/03/2013 Reason for consult: Follow-up assessment   Maternal Data    Feeding   LATCH Score/Interventions                      Lactation Tools Discussed/Used     Consult Status Consult Status: Follow-up Date: 07/04/13 Follow-up type: In-patient    Truddie Crumble 07/03/2013, 10:11 AM

## 2013-07-03 NOTE — Progress Notes (Signed)
Subjective: Postpartum Day 2: Cesarean Delivery  Patient is doing well.  Pain is well-controlled - ranked 6/10 currently.  Bleeding is minimal.  Patient is ambulating to bathroom.  Tolerating food and good PO intake.  No bowel movement or flatus since surgery.  On bowel regimen of Senokot and Mylicon.  Baby is in NICU.  Plans to breastfeed - currently pumping. Wants IUD for [facility].  Objective: Vital signs in last 24 hours: Temp:  [98.1 F (36.7 C)-98.5 F (36.9 C)] 98.4 F (36.9 C) (01/08 1819) Pulse Rate:  [78-84] 84 (01/08 1819) Resp:  [18-20] 18 (01/08 1819) BP: (89-108)/(55-70) 92/59 mmHg (01/08 1819) SpO2:  [96 %-98 %] 96 % (01/08 1819)  Physical Exam:  General: alert, cooperative, appears stated age and no distress Lochia: appropriate Uterine Fundus: firm Incision: healing well, slight clear drainage present DVT Evaluation: No evidence of DVT seen on physical exam. No cords or calf tenderness. Calf/Ankle edema is present.   Recent Labs  07/01/13 0530 07/02/13 0605  HGB 10.5* 8.5*  HCT 31.8* 26.7*    Assessment/Plan: #S/P PLTCS - POD 2:  2/2 FTP and NRFHR; hx of A2GDM, GBS+; doing well post-operatively; given no BM or flatus, discharge planning for tomorrow; schedule post-operative follow-up #Constipation: good bowel sounds; continue bowel regimen of senokot and mylicon; encourage PO intake and ambulation  #MOC:  IUD #Breastfeeding   Kierah Goatley L 07/03/2013, 7:29 AM PA-Student

## 2013-07-03 NOTE — Progress Notes (Signed)
I spoke with and examined patient and agree with PA-S's note and plan of care.  Fredrik Rigger, MD Ob Fellow 07/03/2013 8:22 PM

## 2013-07-04 MED ORDER — IBUPROFEN 600 MG PO TABS: 600.0000 mg | ORAL_TABLET | Freq: Four times a day (QID) | ORAL | Status: DC

## 2013-07-04 MED ORDER — OXYCODONE-ACETAMINOPHEN 5-325 MG PO TABS: 1.0000 | ORAL_TABLET | ORAL | Status: DC | PRN

## 2013-07-04 NOTE — Discharge Summary (Signed)
Obstetric Discharge Summary Reason for Admission: onset of labor and rupture of membranes Prenatal Procedures: dx with A2GDM Intrapartum Procedures: cesarean: low cervical, transverse and GBS prophylaxis Postpartum Procedures: antibiotics (gentamycin) for possible chorioamnionitis  Complications-Operative and Postpartum: none Hemoglobin  Date Value Range Status  07/02/2013 8.5* 12.0 - 15.0 g/dL Final     REPEATED TO VERIFY     DELTA CHECK NOTED     HCT  Date Value Range Status  07/02/2013 26.7* 36.0 - 46.0 % Final    Hospital Course: Wanda Harris is a 33 y.o. [id number] who presented at [redacted]w[redacted]d with SROM of mec stained fluid and early labor. Her preg has been followed by the [facility] and has been remarkable for poorly controlled A2GDM. Pt was GBS + and received PCN. L&D complicated by maternal fever during labor suspicious for chorioamnionitis - given gentamycin- and failure to progress past 9cm with the addition of FHR complications, and so the decision was made to proceed with a PLTCS, performed by Dr. Glo Herring and Dr. Leslie Andrea without complications.  Baby boy 9+10 with APGARs of 7/9 sent to NICU for hypoglycemia, Right Hydronephrosis and evaluation for sepsis.  Infant remains in NICU.  Today is POD #3.   Patient is doing well and her pain has improved.  Bleeding has lessened.  Patient had a bowel movement yesterday and is passing gas - after receiving suppository.  Ambulating well.  Will discharge home with post-operative instructions.  Follow-up appointment scheduled in clinic for 4-6 weeks.  Patient advised to call with any concerns or questions prior to appointment. She is pumping breastmilk and plans on an IUD PP.  Physical Exam:  General: alert, cooperative, appears stated age, fatigued and no distress Heart: RRR Lungs: nl effort Lochia: appropriate Uterine Fundus: firm Incision: no significant erythema, slight clear drainage present DVT Evaluation: No evidence of DVT seen on physical  exam.  Discharge Diagnoses: Term Pregnancy-delivered  Discharge Information: Date: 07/04/2013 Activity: pelvic rest Diet: routine Medications: PNV, Ibuprofen and Percocet Condition: stable Instructions: refer to practice specific booklet Discharge to: home   Newborn Data: Live born female  Birth Weight: 9 lb 9.8 oz (4360 g) APGAR: 7, 9 Infant is in NICU.   TUCKER, BRITTON L 07/04/2013, 7:44 AM

## 2013-07-04 NOTE — Discharge Instructions (Signed)
Parto por cesárea - Cuidados posteriores  °(Cesarean Delivery, Care After) °Siga estas instrucciones durante las próximas semanas. Estas indicaciones le proporcionan información general acerca de cómo deberá cuidarse después del procedimiento. El médico también podrá darle instrucciones más específicas. El tratamiento se ha planificado de acuerdo a las prácticas médicas actuales, pero a veces se producen problemas. Comuníquese con el médico si tiene algún problema o tiene dudas cuando vuelva a su casa.  °INSTRUCCIONES PARA EL CUIDADO EN EL HOGAR  °· Tome sólo medicamentos de venta libre o recetados, según las indicaciones del médico. °· No beba alcohol, especialmente si está amamantando o toma analgésicos. °· Nomastique tabaco ni fume. °· Continúe con un adecuado cuidado perineal. El buen cuidado perineal incluye: °· Higienizarse de adelante hacia atrás. °· Mantener la zona perineal limpia. °· Controlar diariamente el corte (incisión) y observar si aumenta el enrojecimiento, si supura, se hincha o se separa la piel. °· Limpie la incisión suavemente con jabón y agua todos los días, y luego séquela dando golpecitos. Si el médico la autoriza, deje la incisión al descubierto. Use un apósito (vendaje) si drena líquido o la incisión parece irritada. Si las pequeñas tiras adhesivas que cruzan la incisión no se caen dentro de los 7 días, retírelas suavemente. °· Abrace una almohada al toser o estornudar hasta que la incisión se cure. Esto ayuda a aliviar el dolor. °· No conduzca vehículos ni opere maquinarias hasta que el médico la autorice. °· Dúchese, lávese el cabello y tome baños de inmersión según las indicaciones de su médico. °· Utilice un sostén que le ajuste bien y que brinde buen soporte a sus mamas. °· Limite el uso de bombachas de sostén o medias panty. °· Beba suficiente líquido para mantener la orina clara o de color amarillo pálido. °· Consuma todos los días alimentos ricos en fibra como cereales y panes  integrales, arroz, frijoles y frutas frescas y verduras. Estos alimentos pueden ayudarla a prevenir o aliviar el estreñimiento. °· Reanude las actividades como subir escaleras, conducir automóviles, levantar objetos pesados, hacer ejercicios o viajar cuando le indique su médico. °· Hable con su médico acerca de reanudar la actividad sexual. Volver a la actividad sexual depende del riesgo de infección, la velocidad de la curación y la comodidad y su deseo de reanudarla. °· Trate de que alguien la ayude con las actividades del hogar y con el recién nacido al menos durante algunos días después de salir del hospital. °· Descanse todo lo que pueda. Trate de descansar o tomar una siesta mientras el bebé está durmiendo. °· Aumente sus actividades gradualmente. °· Cumpla con todos los controles programados para después del parto. Es muy importante asistir a todas las visitas de control programadas. En estas visitas, su médico va a controlarla para asegurarse de que esté sanando física y emocionalmente. °SOLICITE ATENCIÓN MÉDICA SI:  °· Elimina coágulos grandes por la vagina. Guarde algunos coágulos para mostrarle al médico. °· Tiene una secreción con feo olor que proviene de la vagina. °· Tiene dificultad para orinar. °· Orina con frecuencia. °· Siente dolor al orinar. °· Nota un cambio en sus movimientos intestinales. °· Aumenta el enrojecimiento, el dolor o la hinchazón en la zona de la incisión. °· Observa que supura pus en la incisión. °· La incisión se abre. °· Sus mamas le duelen, están duras o enrojecidas. °· Sufre un dolor intenso de cabeza. °· Tiene visión borrosa o ve manchas. °· Se siente triste o deprimida. °· Tiene pensamientos acerca de lastimarse o dañar al   recin nacido.  Tiene preguntas acerca de su cuidado, la atencin del recin nacido o acerca de los medicamentos.  Se siente mareada o sufre un desmayo.  Tiene una erupcin.  Siente dolor u observa enrojecimiento o hinchazn en el sitio en que  estaba la va intravenosa (IV).  Tiene nuseas o vmitos.  Usted dej de amamantar al beb y no ha tenido su perodo menstrual dentro de las 12 semanas siguientes.  No amamanta al beb y no tuvo su perodo menstrual en las ltimas 12 semanas.  Tiene fiebre. SOLICITE ATENCIN MDICA DE INMEDIATO SI:   Siente dolor persistente.  Siente dolor en el pecho.  Le falta el aire.  Se desmaya.  Siente dolor en la pierna.  Siente Research scientist (life sciences).  El sangrado vaginal satura dos o ms apsitos en 1 hora. ASEGRESE DE QUE:   Comprende estas instrucciones.  Controlar su enfermedad.  Recibir ayuda de inmediato si no mejora o si empeora. Document Released: 06/11/2005 Document Revised: 02/11/2013 [facility] Patient Information 2014 [place], [place].

## 2013-07-04 NOTE — Lactation Note (Signed)
This note was copied from the chart of Wanda Harris. Lactation Consultation Note  Patient Name: [name] [id number] Date: 07/04/2013 Reason for consult: Follow-up assessment;NICU baby  [facility] loaner for mom. Reviewed basics of pumping to protect milk supply. Enc to not watch bottles, and bring kit to NICU to use pump in the department. Engorgement prevention/treatment discussed. Mom has appointment with [facility] for Monday. Mom aware of OP/BFSG services. Maternal Data    Feeding Feeding Type: Breast Milk with Formula added Nipple Type: Regular  LATCH Score/Interventions Latch: Repeated attempts needed to sustain latch, nipple held in mouth throughout feeding, stimulation needed to elicit sucking reflex. Intervention(s): Skin to skin Intervention(s): Adjust position;Assist with latch;Breast massage  Audible Swallowing: None Intervention(s): Skin to skin  Type of Nipple: Everted at rest and after stimulation  Comfort (Breast/Nipple): Soft / non-tender     Hold (Positioning): Assistance needed to correctly position infant at breast and maintain latch. Intervention(s): Breastfeeding basics reviewed  LATCH Score: 6  Lactation Tools Discussed/Used Pump Review: Milk Storage Initiated by:: JW Date initiated:: 07/04/13   Consult Status Consult Status: Complete Follow-up type: Out-patient (Set up by NICU when baby DC.)    Lesli Albee, Moani Weipert 07/04/2013, 4:02 PM

## 2013-07-15 ENCOUNTER — Encounter: Payer: Self-pay | Admitting: *Deleted

## 2013-10-08 ENCOUNTER — Telehealth: Payer: Self-pay

## 2013-10-08 ENCOUNTER — Ambulatory Visit: Payer: Medicaid Other | Admitting: Obstetrics & Gynecology

## 2013-10-08 NOTE — Telephone Encounter (Signed)
Pt. Missed appointment today with Dr. Ihor Dow. Called pt. NO answer. Left message stating we are sorry you missed your appointment today with [place], if you would like to re-schedule, please call clinic. Not re-scheduled at this time as pt. Is 3 months out PP.

## 2014-01-19 ENCOUNTER — Emergency Department (HOSPITAL_COMMUNITY)
Admission: EM | Admit: 2014-01-19 | Discharge: 2014-01-19 | Disposition: A | Discharge status: Home / Self Care | Payer: Medicaid Other | Attending: Emergency Medicine | Admitting: Emergency Medicine

## 2014-01-19 ENCOUNTER — Emergency Department (HOSPITAL_COMMUNITY): Payer: Medicaid Other

## 2014-01-19 ENCOUNTER — Encounter (HOSPITAL_COMMUNITY): Payer: Self-pay | Admitting: Emergency Medicine

## 2014-01-19 DIAGNOSIS — R630 Anorexia: Secondary | ICD-10-CM | POA: Insufficient documentation

## 2014-01-19 DIAGNOSIS — J45901 Unspecified asthma with (acute) exacerbation: Secondary | ICD-10-CM | POA: Insufficient documentation

## 2014-01-19 DIAGNOSIS — Z3202 Encounter for pregnancy test, result negative: Secondary | ICD-10-CM | POA: Insufficient documentation

## 2014-01-19 DIAGNOSIS — Z8632 Personal history of gestational diabetes: Secondary | ICD-10-CM | POA: Insufficient documentation

## 2014-01-19 DIAGNOSIS — E119 Type 2 diabetes mellitus without complications: Secondary | ICD-10-CM | POA: Diagnosis not present

## 2014-01-19 DIAGNOSIS — N898 Other specified noninflammatory disorders of vagina: Secondary | ICD-10-CM | POA: Diagnosis not present

## 2014-01-19 DIAGNOSIS — R1031 Right lower quadrant pain: Secondary | ICD-10-CM | POA: Diagnosis not present

## 2014-01-19 DIAGNOSIS — R109 Unspecified abdominal pain: Secondary | ICD-10-CM | POA: Diagnosis present

## 2014-01-19 LAB — CBC WITH DIFFERENTIAL/PLATELET
BASOS ABS: 0 10*3/uL (ref 0.0–0.1)
BASOS PCT: 0 % (ref 0–1)
EOS PCT: 2 % (ref 0–5)
Eosinophils Absolute: 0.2 10*3/uL (ref 0.0–0.7)
HEMATOCRIT: 35.7 % — AB (ref 36.0–46.0)
Hemoglobin: 11.4 g/dL — ABNORMAL LOW (ref 12.0–15.0)
Lymphocytes Relative: 34 % (ref 12–46)
Lymphs Abs: 2.7 10*3/uL (ref 0.7–4.0)
MCH: 24.1 pg — ABNORMAL LOW (ref 26.0–34.0)
MCHC: 31.9 g/dL (ref 30.0–36.0)
MCV: 75.3 fL — AB (ref 78.0–100.0)
MONO ABS: 0.4 10*3/uL (ref 0.1–1.0)
Monocytes Relative: 5 % (ref 3–12)
Neutro Abs: 4.8 10*3/uL (ref 1.7–7.7)
Neutrophils Relative %: 59 % (ref 43–77)
PLATELETS: 283 10*3/uL (ref 150–400)
RBC: 4.74 MIL/uL (ref 3.87–5.11)
RDW: 15.7 % — AB (ref 11.5–15.5)
WBC: 8 10*3/uL (ref 4.0–10.5)

## 2014-01-19 LAB — COMPREHENSIVE METABOLIC PANEL
ALBUMIN: 3.8 g/dL (ref 3.5–5.2)
ALT: 41 U/L — ABNORMAL HIGH (ref 0–35)
AST: 20 U/L (ref 0–37)
Alkaline Phosphatase: 132 U/L — ABNORMAL HIGH (ref 39–117)
Anion gap: 12 (ref 5–15)
BUN: 13 mg/dL (ref 6–23)
CALCIUM: 9 mg/dL (ref 8.4–10.5)
CO2: 21 mEq/L (ref 19–32)
CREATININE: 0.54 mg/dL (ref 0.50–1.10)
Chloride: 104 mEq/L (ref 96–112)
GFR calc Af Amer: >90 mL/min (ref >90)
GFR calc non Af Amer: >90 mL/min (ref >90)
Glucose, Bld: 256 mg/dL — ABNORMAL HIGH (ref 70–99)
Potassium: 3.8 mEq/L (ref 3.7–5.3)
SODIUM: 137 meq/L (ref 137–147)
Total Bilirubin: <0.2 mg/dL — ABNORMAL LOW (ref 0.3–1.2)
Total Protein: 7.7 g/dL (ref 6.0–8.3)

## 2014-01-19 LAB — URINALYSIS, ROUTINE W REFLEX MICROSCOPIC
Bilirubin Urine: NEGATIVE
Glucose, UA: >1000 mg/dL — AB
Hgb urine dipstick: NEGATIVE
Ketones, ur: NEGATIVE mg/dL
LEUKOCYTES UA: NEGATIVE
Nitrite: NEGATIVE
PH: 6 (ref 5.0–8.0)
Protein, ur: NEGATIVE mg/dL
SPECIFIC GRAVITY, URINE: 1.046 — AB (ref 1.005–1.030)
Urobilinogen, UA: 1 mg/dL (ref 0.0–1.0)

## 2014-01-19 LAB — WET PREP, GENITAL
Trich, Wet Prep: NONE SEEN
Yeast Wet Prep HPF POC: NONE SEEN

## 2014-01-19 LAB — HIV ANTIBODY (ROUTINE TESTING W REFLEX): HIV 1&2 Ab, 4th Generation: NONREACTIVE

## 2014-01-19 LAB — LIPASE, BLOOD: Lipase: 30 U/L (ref 11–59)

## 2014-01-19 LAB — URINE MICROSCOPIC-ADD ON

## 2014-01-19 LAB — PREGNANCY, URINE: Preg Test, Ur: NEGATIVE

## 2014-01-19 MED ORDER — AZITHROMYCIN 250 MG PO TABS
1000.0000 mg | ORAL_TABLET | Freq: Once | ORAL | Status: AC
  Administered 2014-01-19: 1000 mg via ORAL
  Filled 2014-01-19: qty 4

## 2014-01-19 MED ORDER — MORPHINE SULFATE 4 MG/ML IJ SOLN
6.0000 mg | Freq: Once | INTRAMUSCULAR | Status: AC
  Administered 2014-01-19: 6 mg via INTRAVENOUS
  Filled 2014-01-19: qty 2

## 2014-01-19 MED ORDER — KETOROLAC TROMETHAMINE 30 MG/ML IJ SOLN: 30.0000 mg | Freq: Once | INTRAMUSCULAR | Status: DC

## 2014-01-19 MED ORDER — LIDOCAINE HCL (PF) 1 % IJ SOLN
INTRAMUSCULAR | Status: AC
  Administered 2014-01-19: 0.9 mL
  Filled 2014-01-19: qty 5

## 2014-01-19 MED ORDER — CEFTRIAXONE SODIUM 250 MG IJ SOLR
250.0000 mg | Freq: Once | INTRAMUSCULAR | Status: AC
  Administered 2014-01-19: 250 mg via INTRAMUSCULAR
  Filled 2014-01-19: qty 250

## 2014-01-19 MED ORDER — IOHEXOL 300 MG/ML  SOLN
100.0000 mL | Freq: Once | INTRAMUSCULAR | Status: AC | PRN
  Administered 2014-01-19: 100 mL via INTRAVENOUS

## 2014-01-19 MED ORDER — NAPROXEN 500 MG PO TABS: 500.0000 mg | ORAL_TABLET | Freq: Two times a day (BID) | ORAL | Status: DC

## 2014-01-19 MED ORDER — SODIUM CHLORIDE 0.9 % IV BOLUS (SEPSIS)
1000.0000 mL | Freq: Once | INTRAVENOUS | Status: AC
  Administered 2014-01-19: 1000 mL via INTRAVENOUS

## 2014-01-19 MED ORDER — ALBUTEROL SULFATE (2.5 MG/3ML) 0.083% IN NEBU: 5.0000 mg | INHALATION_SOLUTION | Freq: Once | RESPIRATORY_TRACT | Status: DC

## 2014-01-19 MED ORDER — ALBUTEROL SULFATE (2.5 MG/3ML) 0.083% IN NEBU
5.0000 mg | INHALATION_SOLUTION | Freq: Once | RESPIRATORY_TRACT | Status: AC
  Administered 2014-01-19: 5 mg via RESPIRATORY_TRACT
  Filled 2014-01-19: qty 6

## 2014-01-19 MED ORDER — IOHEXOL 300 MG/ML  SOLN
25.0000 mL | INTRAMUSCULAR | Status: AC
  Administered 2014-01-19: 25 mL via ORAL

## 2014-01-19 NOTE — ED Notes (Signed)
Pt alert and oriented at discharge.  Pt provided a wheelchair to the waiting room.  Pt advised that if she felt mildly dizzy to call a family member to come get her from the ED instead of driving herself home.  Pt denied and confirmed understanding.

## 2014-01-19 NOTE — ED Provider Notes (Signed)
CSN: [id number]     Arrival date & time 01/19/14  0102 History   First MD Initiated Contact with Patient 01/19/14 0113     Chief Complaint  Patient presents with  . Asthma  . Abdominal Pain     (Consider location/radiation/quality/duration/timing/severity/associated sxs/prior Treatment) HPI Comments: 33 year-old female with history of gestational diabetes, C-section presents with right lower quadrant pain radiating to the back and Sunday. Mild diarrhea however if symptoms fairly constant, sharp. No history of similar. Except for C-section no other abdominal surgeries. No fevers or chills, mild nausea. No STD history or new sexual partners, mild white discharge, no bleeding or pelvic pain. Nothing improves her symptoms.  Patient is a 33 y.o. female presenting with asthma and abdominal pain. The history is provided by the patient.  Asthma Associated symptoms include abdominal pain. Pertinent negatives include no chest pain, no headaches and no shortness of breath.  Abdominal Pain Associated symptoms: cough and vaginal discharge   Associated symptoms: no chest pain, no chills, no dysuria, no fever, no shortness of breath and no vomiting     Past Medical History  Diagnosis Date  . Diabetes mellitus without complication     GDM  . Gestational diabetes     G2 & G5, takes insulin  . Asthma     does not use inhaler   Past Surgical History  Procedure Laterality Date  . No past surgeries    . Cesarean section N/A 07/01/2013    Procedure: Primary Cesarean Section Delivery Baby Boy @ 2038, Apgars 7/9;  Surgeon: Jonnie Kind, MD;  Location: [facility];  Service: Obstetrics;  Laterality: N/A;   Family History  Problem Relation Age of Onset  . Diabetes Mother   . Hypertension Mother   . Arthritis Mother   . Hyperlipidemia Father    History  Substance Use Topics  . Smoking status: Never Smoker   . Smokeless tobacco: Never Used  . Alcohol Use: No   OB History   Grav Para Term Preterm  Abortions TAB SAB Ect Mult Living   5 4 4  1 1    4      Review of Systems  Constitutional: Positive for appetite change. Negative for fever and chills.  HENT: Negative for congestion.   Eyes: Negative for visual disturbance.  Respiratory: Positive for cough. Negative for shortness of breath.   Cardiovascular: Negative for chest pain.  Gastrointestinal: Positive for abdominal pain. Negative for vomiting.  Genitourinary: Positive for vaginal discharge. Negative for dysuria and flank pain.  Musculoskeletal: Negative for back pain, neck pain and neck stiffness.  Skin: Negative for rash.  Neurological: Negative for light-headedness and headaches.      Allergies  Review of patient's allergies indicates no known allergies.  Home Medications   Prior to Admission medications   Not on File   BP 128/62  Pulse 88  Temp(Src) 98.4 F (36.9 C) (Oral)  Resp 21  Wt 181 lb (82.101 kg)  SpO2 98%  LMP 12/05/2013 Physical Exam  Nursing note and vitals reviewed. Constitutional: She is oriented to person, place, and time. She appears well-developed and well-nourished.  HENT:  Head: Normocephalic and atraumatic.  Eyes: Conjunctivae are normal. Right eye exhibits no discharge. Left eye exhibits no discharge.  Neck: Normal range of motion. Neck supple. No tracheal deviation present.  Cardiovascular: Normal rate and regular rhythm.   Pulmonary/Chest: Effort normal. No respiratory distress. She has wheezes (mild end expiratory wheeze). She has no rales.  Abdominal: Soft.  She exhibits no distension. There is tenderness (mild right lower quadrant). There is no guarding.  Genitourinary:  Mild white dc, mild CMT  Musculoskeletal: She exhibits no edema.  Neurological: She is alert and oriented to person, place, and time.  Skin: Skin is warm. No rash noted.  Psychiatric: She has a normal mood and affect.    ED Course  Procedures (including critical care time) Labs Review Labs Reviewed  WET  PREP, GENITAL - Abnormal; Notable for the following:    Clue Cells Wet Prep HPF POC FEW (*)    WBC, Wet Prep HPF POC MODERATE (*)    All other components within normal limits  CBC WITH DIFFERENTIAL - Abnormal; Notable for the following:    Hemoglobin 11.4 (*)    HCT 35.7 (*)    MCV 75.3 (*)    MCH 24.1 (*)    RDW 15.7 (*)    All other components within normal limits  COMPREHENSIVE METABOLIC PANEL - Abnormal; Notable for the following:    Glucose, Bld 256 (*)    ALT 41 (*)    Alkaline Phosphatase 132 (*)    Total Bilirubin <0.2 (*)    All other components within normal limits  URINALYSIS, ROUTINE W REFLEX MICROSCOPIC - Abnormal; Notable for the following:    Specific Gravity, Urine 1.046 (*)    Glucose, UA >1000 (*)    All other components within normal limits  GC/CHLAMYDIA PROBE AMP  PREGNANCY, URINE  LIPASE, BLOOD  URINE MICROSCOPIC-ADD ON  HIV ANTIBODY (ROUTINE TESTING)    Imaging Review Ct Abdomen Pelvis W Contrast  01/19/2014   CLINICAL DATA:  Asthma attack. Dry cough. Right lower abdominal pain radiating to the low back. Diarrhea.  EXAM: CT ABDOMEN AND PELVIS WITH CONTRAST  TECHNIQUE: Multidetector CT imaging of the abdomen and pelvis was performed using the standard protocol following bolus administration of intravenous contrast.  CONTRAST:  151mL OMNIPAQUE IOHEXOL 300 MG/ML  SOLN  COMPARISON:  None.  FINDINGS: Lung bases are clear.  Diffuse fatty infiltration of the liver. Gallbladder, spleen, pancreas, adrenal glands, kidneys, abdominal aorta, inferior vena cava, and retroperitoneal lymph nodes are unremarkable. Stomach, small bowel, and colon appear normal for degree of distention. No free air or free fluid in the abdomen.  Pelvis: Uterus and ovaries are not enlarged. Bladder wall is not thickened. No free or loculated pelvic fluid collections. The appendix is normal. No pelvic mass or lymphadenopathy. No destructive bone lesions.  IMPRESSION: Diffuse fatty infiltration of the  liver. No focal acute process demonstrated in the abdomen or pelvis.   Electronically Signed   By: Lucienne Capers M.D.   On: 01/19/2014 03:16     EKG Interpretation None      MDM   Final diagnoses:  Vaginal discharge  Right lower quadrant abdominal pain  Asthma exacerbation mild  Patient has right lower quadrant pain discussed differential appendicitis, colitis, ovarian. On exam pain is more abdominal than pelvic. Pelvic exam pending.  CT abdomen and reviewed results no acute findings. Blood work hyperglycemia, no ketones in the urine. Patient improved on recheck with fluids and pain meds. Pelvic exam showed mild cervical motion tenderness and discharge, cervicitis prophylactic treatment given. Stress close followup with women's/OB/GYN. Patient comfortable the plan  If you were given medicines take as directed.  If you are on coumadin or contraceptives realize their levels and effectiveness is altered by many different medicines.  If you have any reaction (rash, tongues swelling, other) to the medicines stop  taking and see a physician.   Please follow up as directed and return to the ER or see a physician for new or worsening symptoms.  Thank you. Filed Vitals:   01/19/14 0130 01/19/14 0200 01/19/14 0330 01/19/14 0400  BP: 116/54 128/62 111/62 105/49  Pulse: 62 88 76 67  Temp:      TempSrc:      Resp: 26 21    Weight:      SpO2: 99% 98% 99% 98%      Mariea Clonts, MD 01/19/14 [phone]

## 2014-01-19 NOTE — ED Notes (Signed)
Pt. reports asthma attack with dry cough  and right lower abdominal pain radiating to lower back onset yesterday / diarrhea onset Saturday .

## 2014-01-19 NOTE — Discharge Instructions (Signed)
If you were given medicines take as directed.  If you are on coumadin or contraceptives realize their levels and effectiveness is altered by many different medicines.  If you have any reaction (rash, tongues swelling, other) to the medicines stop taking and see a physician.   Please follow up as directed and return to the ER or see a physician for new or worsening symptoms.  Thank you. Filed Vitals:   01/19/14 0130 01/19/14 0200 01/19/14 0330 01/19/14 0400  BP: 116/54 128/62 111/62 105/49  Pulse: 62 88 76 67  Temp:      TempSrc:      Resp: 26 21    Weight:      SpO2: 99% 98% 99% 98%    Dolor abdominal en las mujeres (Abdominal Pain, Women) El dolor abdominal (en el estmago, la pelvis o el vientre) puede tener muchas causas. Es importante que le informe a su mdico:  La ubicacin del Social research officer, government.  Viene y se va, o persiste todo el tiempo?  Hay situaciones que English as a second language teacher (comer ciertos alimentos, la actividad fsica)?  Tiene otros sntomas asociados al dolor (fiebre, nuseas, vmitos, diarrea)? Todo es de gran ayuda cuando se trata de hallar la causa del dolor. CAUSAS  Estmago: Infecciones por virus o bacterias, o lcera.  Intestino: Apendicitis (apndice inflamado), ileitis regional (enfermedad de Crohn), colitis ulcerosa (colon inflamado), sndrome del colon irritable, diverticulitis (inflamacin de los divertculos del colon) o cncer de estmago oo intestino.  Enfermedades de la vescula biliar o clculos.  Enfermedades renales, clculos o infecciones en el rin.  Infeccin o cncer del pncreas.  Fibromialgia (trastorno doloroso)  Enfermedades de los rganos femeninos:  Uterus: tero: fibroma (tumor no canceroso) o infeccin  Trompas de [place]: infeccin o embarazo ectpico  En los ovarios, quistes o tumores.  Adherencias plvicas (tejido cicatrizal).  Endometriosis (el tejido que cubre el tero se desarrolla en la pelvis y los rganos plvicos).  Sndrome  de Occupational psychologist (los rganos femeninos se llenan de sangre antes del periodo menstrual(  Dolor durante el periodo menstrual.  Dolor durante la ovulacin (al producir vulos).  Dolor al usar el DIU (dispositivo intrauterino para el control de la natalidad)  Programmer, systems los rganos femeninos.  Dolor funcional (no est originado en una enfermedad, puede mejorar sin tratamiento).  Dolor de origen psicolgico  Depresin. DIAGNSTICO Su mdico decidir la gravedad del dolor a travs del examen fsico  Anlisis de sangre  Radiografas  Ecografas  TC (tomografa computada, tipo especial de radiografas).  IMR (resonancia magntica)  Cultivos, en el caso una infeccin  Colon por enema de bario (se inserta una sustancia de contraste en el intestino grueso para mejorar la observacin con rayos X.)  Colonoscopa (observacin del intestino con un tubo luminoso).  Laparoscopa (examen del interior del abdomen con un tubo que tiene [employer]).  Ciruga exploratoria abdominal mayor (se observa el abdomen realizando una gran incisin). TRATAMIENTO El tratamiento depender de la causa del problema.   Muchos de estos casos pueden controlarse y tratarse en casa.  Medicamentos de venta libre indicados por el mdico.  Medicamentos con receta.  Antibiticos, en caso de infeccin  Pldoras anticonceptivas, en el caso de perodos dolorosos o dolor al ovular.  Tratamiento hormonal, para la endometriosis  Inyecciones para bloqueo nervioso selectivo.  Fisioterapia.  Antidepresivos.  Consejos por parte de un psclogo o psiquiatra.  Ciruga mayor o menor. INSTRUCCIONES PARA EL CUIDADO DOMICILIARIO  No tome ni administre laxantes a menos que se lo  haya indicado su mdico.  Tome analgsicos de venta libre slo si se lo ha indicado el profesional que lo asiste. No tome aspirina, ya que puede causar [employer] o hemorragias.  Consuma una dieta lquida (caldo o agua)  segn lo indicado por el mdico. Progrese lentamente a una dieta blanda, segn la tolerancia, si el dolor se relaciona con el estmago o el intestino.  Tenga un termmetro y tmese la temperatura varias veces al da.  Haga reposo en la cama y [place], si esto Ship broker.  Evite las relaciones sexuales, Higher education careers adviser.  Evite las situaciones estresantes.  Cumpla con las visitas y los anlisis de control, segn las indicaciones de su mdico.  Si el dolor no se [place] con los medicamentos o la [place] tratar con:  Acupuntura.  Ejercicios de relajacin (yoga, meditacin).  Terapia grupal.  Psicoterapia. SOLICITE ATENCIN MDICA SI:  Nota que ciertos Writer de [place].  El tratamiento indicado para Lexicographer no Engineer, civil (consulting).  Necesita analgsicos ms fuertes.  Quiere que le retiren el DIU.  Si se siente confundido o desfalleciente.  Presenta nuseas o vmitos.  Aparece una erupcin cutnea.  Sufre efectos adversos o una reaccin alrgica debido a los medicamentos que toma. SOLICITE ATENCIN MDICA DE INMEDIATO SI:  El dolor persiste o se agrava.  Tiene fiebre.  Siente el dolor slo en algunos sectores del abdomen. Si se localiza en la zona derecha, posiblemente podra tratarse de apendicitis. En un adulto, si se localiza en la regin inferior izquierda del abdomen, podra tratarse de colitis o diverticulitis.  Hay sangre en las heces (deposiciones de color rojo brillante o negro alquitranado), con o sin vmitos.  Usted presenta sangre en la orina.  Siente escalofros con o sin fiebre.  Se desmaya. ASEGRESE QUE:   Comprende estas instrucciones.  Controlar su enfermedad.  Solicitar ayuda de inmediato si no mejora o si empeora. Document Released: 09/27/2008 Document Revised: 09/03/2011 [facility] Patient Information 2015 [place]. This information is not intended to replace advice given to you by your  health care provider. Make sure you discuss any questions you have with your health care provider.

## 2014-01-20 LAB — GC/CHLAMYDIA PROBE AMP
CT PROBE, AMP APTIMA: NEGATIVE
GC PROBE AMP APTIMA: NEGATIVE

## 2014-04-26 ENCOUNTER — Encounter (HOSPITAL_COMMUNITY): Payer: Self-pay | Admitting: Emergency Medicine

## 2014-08-26 ENCOUNTER — Ambulatory Visit (INDEPENDENT_AMBULATORY_CARE_PROVIDER_SITE_OTHER): Payer: Medicaid Other

## 2014-08-26 ENCOUNTER — Encounter: Payer: Self-pay | Admitting: Emergency Medicine

## 2014-08-26 ENCOUNTER — Emergency Department
Admission: EM | Admit: 2014-08-26 | Discharge: 2014-08-26 | Disposition: A | Discharge status: Home / Self Care | Payer: Medicaid Other | Source: Home / Self Care | Attending: Emergency Medicine | Admitting: Emergency Medicine

## 2014-08-26 ENCOUNTER — Encounter: Payer: Self-pay | Admitting: Sports Medicine

## 2014-08-26 ENCOUNTER — Ambulatory Visit (INDEPENDENT_AMBULATORY_CARE_PROVIDER_SITE_OTHER): Payer: Medicaid Other | Admitting: Sports Medicine

## 2014-08-26 ENCOUNTER — Emergency Department (INDEPENDENT_AMBULATORY_CARE_PROVIDER_SITE_OTHER): Payer: Medicaid Other

## 2014-08-26 VITALS — BP 102/70 | HR 62 | Wt 176.0 lb

## 2014-08-26 DIAGNOSIS — M25511 Pain in right shoulder: Secondary | ICD-10-CM | POA: Diagnosis not present

## 2014-08-26 DIAGNOSIS — M4602 Spinal enthesopathy, cervical region: Secondary | ICD-10-CM

## 2014-08-26 DIAGNOSIS — M75101 Unspecified rotator cuff tear or rupture of right shoulder, not specified as traumatic: Secondary | ICD-10-CM | POA: Diagnosis not present

## 2014-08-26 DIAGNOSIS — M5412 Radiculopathy, cervical region: Secondary | ICD-10-CM

## 2014-08-26 DIAGNOSIS — M7541 Impingement syndrome of right shoulder: Secondary | ICD-10-CM

## 2014-08-26 MED ORDER — PREDNISONE 50 MG PO TABS: ORAL_TABLET | ORAL | Status: DC

## 2014-08-26 MED ORDER — CYCLOBENZAPRINE HCL 10 MG PO TABS: ORAL_TABLET | ORAL | Status: DC

## 2014-08-26 MED ORDER — KETOROLAC TROMETHAMINE 60 MG/2ML IM SOLN
60.0000 mg | Freq: Once | INTRAMUSCULAR | Status: AC
  Administered 2014-08-26: 60 mg via INTRAMUSCULAR

## 2014-08-26 MED ORDER — MELOXICAM 15 MG PO TABS: ORAL_TABLET | ORAL | Status: DC

## 2014-08-26 NOTE — Assessment & Plan Note (Signed)
Right C7. Prednisone, meloxicam, Flexeril, formal physical therapy, x-rays. Return in one month, MRI for intervention if no better.

## 2014-08-26 NOTE — ED Provider Notes (Signed)
CSN: [id number]     Arrival date & time 08/26/14  0932 History   First MD Initiated Contact with Patient 08/26/14 [phone]     Chief Complaint  Patient presents with  . Shoulder Pain    Patient is a 34 y.o. female presenting with shoulder pain. The history is provided by the patient.  Shoulder Pain Location:  Shoulder Time since incident:  5 months Injury: yes   Mechanism of injury comment:  Moving heavy boxes Shoulder location:  R shoulder Pain details:    Quality:  Sharp and shooting   Radiates to:  R fingers   Severity:  Severe   Progression:  Worsening Prior injury to area:  Unable to specify Relieved by:  Nothing Worsened by:  Movement Ineffective treatments:  Immobilization (Vicodin) Associated symptoms: decreased range of motion and numbness   Associated symptoms: no fever, no stiffness and no swelling   Risk factors: no concern for non-accidental trauma, no known bone disorder and no recent illness    She states she injured right shoulder lifting heavy boxes 5 months ago. Felt a pop and had immediate pain and stiffness right shoulder but she lived with the pain until it worsened over the past few weeks. She went to emergency room, current is the [facility] about 2 weeks ago where she was diagnosed with rotator cuff impingement syndrome, given a sling, hydrocodone and ibuprofen. This has not helped significantly and symptoms worsened the past couple of days. She states she's tried to make referral to an orthopedist, but orthopedic offices would not give her an appointment.  She has some associated right lateral neck pain but no midline or C-spine pain. Past Medical History  Diagnosis Date  . Diabetes mellitus without complication     GDM  . Gestational diabetes     G2 & G5, takes insulin  . Asthma     does not use inhaler   Past Surgical History  Procedure Laterality Date  . No past surgeries    . Cesarean section N/A 07/01/2013    Procedure: Primary Cesarean Section  Delivery Baby Boy @ 2038, Apgars 7/9;  Surgeon: Jonnie Kind, MD;  Location: [facility];  Service: Obstetrics;  Laterality: N/A;   Family History  Problem Relation Age of Onset  . Diabetes Mother   . Hypertension Mother   . Arthritis Mother   . Hyperlipidemia Father    History  Substance Use Topics  . Smoking status: Never Smoker   . Smokeless tobacco: Never Used  . Alcohol Use: No   OB History    Gravida Para Term Preterm AB TAB SAB Ectopic Multiple Living   5 4 4  1 1    4      Review of Systems  Constitutional: Negative for fever.  Musculoskeletal: Negative for stiffness.  All other systems reviewed and are negative.   Allergies  Review of patient's allergies indicates not on file.  Home Medications   Prior to Admission medications   Medication Sig Start Date End Date Taking? Authorizing Provider  HYDROcodone-acetaminophen (NORCO) 10-325 MG per tablet Take 1 tablet by mouth every 6 (six) hours as needed.   Yes Historical Provider, MD  ibuprofen (ADVIL,MOTRIN) 800 MG tablet Take 800 mg by mouth every 8 (eight) hours as needed.   Yes Historical Provider, MD  naproxen (NAPROSYN) 500 MG tablet Take 1 tablet (500 mg total) by mouth 2 (two) times daily. 01/19/14   Mariea Clonts, MD   BP 111/75 mmHg  Pulse 75  Temp(Src) 98.2 F (36.8 C) (Oral)  Ht 4\' 11"  (1.499 m)  Wt 177 lb (80.287 kg)  BMI 35.73 kg/m2  SpO2 98% Physical Exam  Constitutional: She is oriented to person, place, and time. She appears well-developed and well-nourished. No distress.  Uncomfortable from right shoulder pain  HENT:  Head: Normocephalic and atraumatic.  Eyes: Conjunctivae and EOM are normal. Pupils are equal, round, and reactive to light. No scleral icterus.  Neck: Normal range of motion.  Cardiovascular: Normal rate.   Pulmonary/Chest: Effort normal.  Abdominal: She exhibits no distension.  Musculoskeletal:       Right shoulder: She exhibits decreased range of motion, tenderness, bony  tenderness and spasm. She exhibits no swelling and no laceration.       Cervical back: She exhibits no bony tenderness (no C-spine tenderness or deformity).  Neurological: She is alert and oriented to person, place, and time. No cranial nerve deficit or sensory deficit.  Questionable decreased strength right hand grip, uncertain if this is related to referred pain from right shoulder.  Skin: Skin is warm.  Psychiatric: She has a normal mood and affect.  Nursing note and vitals reviewed.   ED Course  Procedures (including critical care time) Labs Review Labs Reviewed - No data to display  Imaging Review Dg Shoulder Right  08/26/2014   CLINICAL DATA:  Severe RIGHT shoulder pain and tenderness. Symptoms since October 2015.  EXAM: RIGHT SHOULDER - 2+ VIEW  COMPARISON:  None.  FINDINGS: There is no evidence of fracture or dislocation. There is no evidence of arthropathy or other focal bone abnormality. Soft tissues are unremarkable. Type 3 acromion is present which can be associated with rotator cuff injury.  IMPRESSION: No acute osseous abnormality.   Electronically Signed   By: Dereck Ligas M.D.   On: 08/26/2014 10:30     MDM   1. Right shoulder pain   2. Rotator cuff impingement syndrome of right shoulder    clinically, no evidence of C-spine involvement by history and physical exam.  Treatment options discussed, as well as risks, benefits, alternatives. Patient voiced understanding and agreement with the following plans: Ordered x-ray right shoulder. Toradol 60 mg IM stat We gently placed right arm in the sling that she had been provided in the ER. Reevaluated 30 minutes later, right shoulder pain somewhat improved. We arranged for urgent work in appointment with Dr. Dianah Field for further evaluation and treatment, today 2:30 PM. Written information regarding referral given to patient. She voiced understanding and agreement with these plans.    Jacqulyn Cane, MD 08/26/14  1046

## 2014-08-26 NOTE — ED Notes (Signed)
Patient was seen on Feb 17 for rt shoulder pain, Dx Rotator Cuff impingement syndrome, given Hydrocone and Ibuprofen, sling, exercises and referred to [facility]. She has not been able to get appt with Ortho and is in severe pain

## 2014-08-26 NOTE — Progress Notes (Signed)
   Subjective:    I'm seeing this patient as a consultation for:  Dr. Jacqulyn Cane   CC: right shoulder pain  HPI:  Pt presents with complaint of right neck and shoulder pain for 6 months, which started abruptly when she caught a heavy box with her right arm. She had been trying to simply deal with the pain but it continued to worsen. The pain is sharp and shoots down her arm to her hand. She also has numbness and tingling, most severe in her 3d and 4th digits. She is unable to perform overhead activities or fasten her brassiere. Recently the pain also wakes her up at night. She presented to the ED 2 weeks ago where she was placed in a sling and given a prescription for hydrocodone. She returned to the ED today because the hydrocodone no longer relieves her pain.   Past medical history, Surgical history, Family history not pertinant except as noted below, Social history, Allergies, and medications have been entered into the medical record, reviewed, and no changes needed.   Review of Systems: No headache, visual changes, nausea, vomiting, diarrhea, constipation, dizziness, abdominal pain, skin rash, fevers, chills, night sweats, weight loss, swollen lymph nodes, body aches, joint swelling, muscle aches, chest pain, shortness of breath, mood changes, visual or auditory hallucinations.   Objective:   General: Well Developed, well nourished, and in no acute distress.  Neuro/Psych: Alert and oriented x3, extra-ocular muscles intact, able to move all 4 extremities, sensation grossly intact. Skin: Warm and dry, no rashes noted.  Respiratory: Not using accessory muscles, speaking in full sentences, trachea midline.  Cardiovascular: Pulses palpable, no extremity edema. Abdomen: Does not appear distended. MSK: Right cervical paraspinal tenderness to, no tenderness over spine. Right Shoulder: Inspection reveals no abnormalities, atrophy or asymmetry. Shoulder is diffusely hyperesthetic with generalized  tenderness to palpation. ROM is limited by pain. Rotator cuff strength normal throughout. Normal strength in biceps, triceps, handgrip and interossei.   X-rays show degenerative changes at C4-5 and C5-6.  Impression and Recommendations:   This case required medical decision making of moderate complexity.  # Right Cervical Radiculopathy - Patient's symptoms localize to C6-7 - While patient does have pain reproducible with palpation and special tests, however this appears to be more so an element of myofascial pain and muscle spasm associated with her radiculopathy than with rotator cuff impingement or injury. - Begin cyclobenzaprine 10 mg QHS - Begin meloxicam 15 mg  - Begin prednisone 50 mg and taper over 5 days - Pt referred for formal PT - Pt told to discontinue wearing her arm sling and resume activity as tolerated  Follow up in 4 weeks or sooner if needed

## 2014-09-09 ENCOUNTER — Ambulatory Visit: Payer: Medicaid Other | Admitting: Rehabilitation

## 2014-09-16 ENCOUNTER — Ambulatory Visit: Payer: Medicaid Other | Attending: Sports Medicine | Admitting: Rehabilitation

## 2014-09-23 ENCOUNTER — Ambulatory Visit: Payer: Medicaid Other | Admitting: Rehabilitation

## 2014-09-23 ENCOUNTER — Ambulatory Visit (INDEPENDENT_AMBULATORY_CARE_PROVIDER_SITE_OTHER): Payer: Medicaid Other | Admitting: Sports Medicine

## 2014-09-23 ENCOUNTER — Encounter: Payer: Self-pay | Admitting: Sports Medicine

## 2014-09-23 VITALS — BP 102/70 | HR 64 | Wt 177.0 lb

## 2014-09-23 DIAGNOSIS — M25511 Pain in right shoulder: Secondary | ICD-10-CM

## 2014-09-23 NOTE — Progress Notes (Signed)
  Subjective:    CC: Follow-up  HPI: Right shoulder pain: There was an element of right-sided C7 radiculopathy with right-sided axial neck pain however today her pain is predominantly referable to the shoulder joint, moderate, persistent without radiation. She has not yet done any physical therapy.  Past medical history, Surgical history, Family history not pertinant except as noted below, Social history, Allergies, and medications have been entered into the medical record, reviewed, and no changes needed.   Review of Systems: No fevers, chills, night sweats, weight loss, chest pain, or shortness of breath.   Objective:    General: Well Developed, well nourished, and in no acute distress.  Neuro: Alert and oriented x3, extra-ocular muscles intact, sensation grossly intact.  HEENT: Normocephalic, atraumatic, pupils equal round reactive to light, neck supple, no masses, no lymphadenopathy, thyroid nonpalpable.  Skin: Warm and dry, no rashes. Cardiac: Regular rate and rhythm, no murmurs rubs or gallops, no lower extremity edema.  Respiratory: Clear to auscultation bilaterally. Not using accessory muscles, speaking in full sentences. Right Shoulder: Inspection reveals no abnormalities, atrophy or asymmetry. Palpation is normal with no tenderness over AC joint or bicipital groove. ROM is full in all planes. Rotator cuff strength weak to abduction Positive Neer and Hawkin's tests, empty can. Speeds and Yergason's tests normal. Positive crank sign Normal scapular function observed. No painful arc and no drop arm sign. No apprehension sign  Procedure: Real-time Ultrasound Guided Injection of right subacromial bursa Device: GE Logiq E  Verbal informed consent obtained.  Time-out conducted.  Noted no overlying erythema, induration, or other signs of local infection.  Skin prepped in a sterile fashion.  Local anesthesia: Topical Ethyl chloride.  With sterile technique and under real time  ultrasound guidance:  1 mL kenalog 40, 3 mL lidocaine injected easily. Completed without difficulty  Pain immediately resolved suggesting accurate placement of the medication.  Advised to call if fevers/chills, erythema, induration, drainage, or persistent bleeding.  Images permanently stored and available for review in the ultrasound unit.  Impression: Technically successful ultrasound guided injection.  Procedure: Real-time Ultrasound Guided Injection of Right glenohumeral joint Device: GE Logiq E  Verbal informed consent obtained.  Time-out conducted.  Noted no overlying erythema, induration, or other signs of local infection.  Skin prepped in a sterile fashion.  Local anesthesia: Topical Ethyl chloride.  With sterile technique and under real time ultrasound guidance:  Spinal needle advanced into the glenohumeral joint taking care to avoid the labrum, I injected total of 1 mL kenalog 40, 4 mL lidocaine. Completed without difficulty  Pain immediately resolved suggesting accurate placement of the medication.  Advised to call if fevers/chills, erythema, induration, drainage, or persistent bleeding.  Images permanently stored and available for review in the ultrasound unit.  Impression: Technically successful ultrasound guided injection.  Impression and Recommendations:

## 2014-09-23 NOTE — Assessment & Plan Note (Addendum)
There is an element of right C7 radiculopathy however today her pain is predominantly referable to the subacromial bursa and the glenohumeral joint. Injections placed into the subacromial bursa and glenohumeral joint today under ultrasound guidance.  Return to see me in one month, if persistent symptoms we will proceed with MRI of the cervical spine for interventional injection planning. Patient is Medicaid so probably did not do physical therapy here, I will send another referral to [facility].

## 2014-09-28 ENCOUNTER — Ambulatory Visit: Payer: Medicaid Other | Admitting: Physical Therapy

## 2014-09-28 ENCOUNTER — Encounter: Payer: Self-pay | Admitting: Physical Therapy

## 2014-09-28 DIAGNOSIS — M25511 Pain in right shoulder: Secondary | ICD-10-CM | POA: Insufficient documentation

## 2014-09-28 NOTE — Therapy (Signed)
[facility] [address]  [place], [place], [address] Phone: [phone]   Fax:  [phone]  Physical Therapy Evaluation  Patient Details  Name: Wanda Harris MRN: [id number] Date of Birth: 05-31-1981 Referring Provider:  Silverio Decamp,*  Encounter Date: 09/28/2014    Past Medical History  Diagnosis Date  . Diabetes mellitus without complication     GDM  . Gestational diabetes     G2 & G5, takes insulin  . Asthma     does not use inhaler    Past Surgical History  Procedure Laterality Date  . No past surgeries    . Cesarean section N/A 07/01/2013    Procedure: Primary Cesarean Section Delivery Baby Boy @ 2038, Apgars 7/9;  Surgeon: Jonnie Kind, MD;  Location: [facility];  Service: Obstetrics;  Laterality: N/A;    There were no vitals filed for this visit.  Visit Diagnosis:  Pain in joint, shoulder region, right      Subjective Assessment - 09/28/14 0852    Subjective Pt here due to R Shoulder pain, she underwent bursa injection on 09/23/14 and pt reports noting some benefit from this.  She states shoulder began hurting Sep/OCt 2015.  She states she noted pain following some heavy lifting / moving around that time.  States pain and LOM progressed and she began noting N/T into R UE in Feb 2016.  Evaluation stopped at this time due to pt here with sick child that needed to go home.   Diagnostic tests R Shoulder x-ray on 08/26/14 normal, R C-spine x-ray on 08/26/14 reveal c4/5 and c5/6 spurring   Currently in Pain? Yes   Pain Score 6    Pain Location Shoulder   Pain Orientation Right;Anterior   Pain Descriptors / Indicators Sharp   Pain Frequency Constant   Aggravating Factors  carrying child, reaching overhead and behind, lying supine without support to arm   Pain Relieving Factors meds, lying with pillow supporting arm, rest, massage           Patient arrived to PT Initial evaluation with her  infant child.  Child nonstop crying for 20 minutes while she was here and he began vomiting on himself and on patient.  I asked the patient if her child was ill and she said he was running a fever so I advised her she'd have to reschedule her PT Evaluation.  Subjective portion of evaluation and history was all that was completed today so no charges.  Will complete eval and HEP instruction once she returns.  She is currently re-scheduled to return in 2 days.                                 Problem List Patient Active Problem List   Diagnosis Date Noted  . Right shoulder pain 08/26/2014  . Active preterm labor 07/01/2013  . Cephalopelvic disproportion, delivered, current hospitalization 07/01/2013  . Chorioamnionitis, delivered, current hospitalization 07/01/2013  . Abnormal fetal ultrasound 04/01/2013  . GBS (group B streptococcus) UTI complicating pregnancy 62/08/5595  . Class B gestational diabetes mellitus 01/16/2013  . First trimester bleeding 01/16/2013  . Supervision of other normal pregnancy 01/15/2013    [facility] PT, O[facility] 09/28/2014, 9:06 AM  [facility] [address]  [place], [place], [address] Phone: [phone]   Fax:  [phone]

## 2014-09-30 ENCOUNTER — Encounter: Payer: Self-pay | Admitting: Rehabilitation

## 2014-09-30 ENCOUNTER — Ambulatory Visit: Payer: Medicaid Other | Attending: Sports Medicine | Admitting: Rehabilitation

## 2014-09-30 DIAGNOSIS — M25511 Pain in right shoulder: Secondary | ICD-10-CM

## 2014-09-30 NOTE — Therapy (Signed)
[facility] [address]  [place], [place], [address] Phone: [phone]   Fax:  [phone]  Physical Therapy Treatment  Patient Details  Name: Wanda Harris MRN: [id number] Date of Birth: [birth year] Referring Provider:  Silverio Decamp,*  Encounter Date: 09/30/2014      PT End of Session - 09/30/14 1027    Visit Number 1   Number of Visits 1   PT Start Time 0935   PT Stop Time 1020   PT Time Calculation (min) 45 min      Past Medical History  Diagnosis Date  . Diabetes mellitus without complication     GDM  . Gestational diabetes     G2 & G5, takes insulin  . Asthma     does not use inhaler    Past Surgical History  Procedure Laterality Date  . No past surgeries    . Cesarean section N/A 07/01/2013    Procedure: Primary Cesarean Section Delivery Baby Boy @ 2038, Apgars 7/9;  Surgeon: Jonnie Kind, MD;  Location: [facility];  Service: Obstetrics;  Laterality: N/A;    There were no vitals filed for this visit.  Visit Diagnosis:  Pain in joint, shoulder region, right - Plan: PT plan of care cert/re-cert      Subjective Assessment - 09/30/14 0936    Subjective Pt here due to R Shoulder pain, she underwent bursa injection on 09/23/14 and pt reports noting some benefit from this.  She states shoulder began hurting Sep/OCt 201 after lifting / moving around that time.  States pain and LOM progressed and she began noting N/T into R UE in Feb 2016.    Diagnostic tests R Shoulder x-ray on 08/26/14 normal, R C-spine x-ray on 08/26/14 reveal c4/5 and c5/6 spurring   Currently in Pain? Yes   Pain Score 6    Pain Location Shoulder   Pain Orientation Right   Pain Descriptors / Indicators Sharp   Pain Frequency Constant   Aggravating Factors  carrying child, reaching overhead   Pain Relieving Factors meds, rest, massage            [facility] PT Assessment - 09/30/14 0001    Assessment   Medical Diagnosis  shoulder pain   Onset Date 03/25/14   Prior Therapy no   Precautions   Precautions None   Balance Screen   Has the patient fallen in the past 6 months No   Prior Function   Level of Independence Independent with basic ADLs   Posture/Postural Control   Posture Comments rounded shoulders, forward head, poor workstation set up per patient upon discussion   ROM / Strength   AROM / PROM / Strength AROM;PROM;Strength   AROM   Overall AROM Comments cervical tightness flexion, sidebend left with R UT pain and tension.  R shoulder: pain and limitations into flexion of 110, abd of 110, to L4 all with pain   PROM   Overall PROM Comments pain EOR all shoulder motions but no sig limitations   Strength   Overall Strength Comments strength of 4+/5 without pain all motions   Palpation   Palpation +2-3 ttp R UT and supraspinatus bellies, subacromial space   Special Tests    Special Tests Cervical   Cervical Tests Spurling's   Spurling's   Findings Negative   Side Right  PT Education - 09/30/14 1027    Education provided Yes   Education Details HEP, proper workstation set up, posture, diagnosis, ice, heat   Person(s) Educated Patient   Methods Explanation;Demonstration;Handout   Comprehension Verbalized understanding;Returned demonstration             PT Long Term Goals - 09/30/14 1030    PT LONG TERM GOAL #1   Title independent with HEP   Time 1   Period Days   Status Achieved               Plan - 09/30/14 1028    Clinical Impression Statement Pt presents with pain and tenderness in the R UT trapezius and supraspinatus muscle bellies, painful active shoulder ROM, and poor workstation set-up leading to R shoulder impingement syndrome and pain. cervical testing negative as of today.  Due to medicaid visit limitations patient only receives one visit of PT.  She was educated on proper posture, ice, heat, workstation set-up, and neck  and shoulder ROM/stretching exercises for self care.    PT Frequency One time visit   PT Treatment/Interventions ADLs/Self Care Home Management   Consulted and Agree with Plan of Care Patient        Problem List Patient Active Problem List   Diagnosis Date Noted  . Right shoulder pain 08/26/2014  . Active preterm labor 07/01/2013  . Cephalopelvic disproportion, delivered, current hospitalization 07/01/2013  . Chorioamnionitis, delivered, current hospitalization 07/01/2013  . Abnormal fetal ultrasound 04/01/2013  . GBS (group B streptococcus) UTI complicating pregnancy 84/13/2440  . Class B gestational diabetes mellitus 01/16/2013  . First trimester bleeding 01/16/2013  . Supervision of other normal pregnancy 01/15/2013   PHYSICAL THERAPY DISCHARGE SUMMARY  Visits from Start of Care: 1  Current functional level related to goals / functional outcomes:  Due to changes in the [facility] Policy for Rehab as of June 1st , 2014-- this patient does not have a qualifying diagnosis that is covered.  The patient is unable topay out of pocket expenses at this time, there fore will not be seen for treatment.       Education / Equipment: HEP Plan: Patient agrees to discharge.  Patient goals were met. Patient is being discharged due to meeting the stated rehab goals.  ?????      Stark Bray, DPT, CMP 09/30/2014, 10:33 AM  [facility] [address]  [place], [place], [address] Phone: [phone]   Fax:  [phone]

## 2014-10-21 ENCOUNTER — Ambulatory Visit: Payer: Medicaid Other | Admitting: Sports Medicine

## 2014-10-27 ENCOUNTER — Emergency Department: Admission: EM | Admit: 2014-10-27 | Discharge: 2014-10-27 | Payer: Medicaid Other | Source: Home / Self Care

## 2015-02-09 ENCOUNTER — Emergency Department (HOSPITAL_COMMUNITY)
Admission: EM | Admit: 2015-02-09 | Discharge: 2015-02-09 | Discharge status: Absent without leave | Payer: Medicaid Other | Source: Home / Self Care

## 2015-02-09 ENCOUNTER — Emergency Department (HOSPITAL_COMMUNITY)
Admission: EM | Admit: 2015-02-09 | Discharge: 2015-02-10 | Disposition: A | Discharge status: Home / Self Care | Payer: Self-pay | Attending: Emergency Medicine | Admitting: Emergency Medicine

## 2015-02-09 ENCOUNTER — Encounter (HOSPITAL_COMMUNITY): Payer: Self-pay | Admitting: Emergency Medicine

## 2015-02-09 ENCOUNTER — Emergency Department (HOSPITAL_COMMUNITY): Payer: Medicaid Other

## 2015-02-09 DIAGNOSIS — R739 Hyperglycemia, unspecified: Secondary | ICD-10-CM

## 2015-02-09 DIAGNOSIS — J45909 Unspecified asthma, uncomplicated: Secondary | ICD-10-CM | POA: Insufficient documentation

## 2015-02-09 DIAGNOSIS — Z3202 Encounter for pregnancy test, result negative: Secondary | ICD-10-CM | POA: Insufficient documentation

## 2015-02-09 DIAGNOSIS — E1165 Type 2 diabetes mellitus with hyperglycemia: Secondary | ICD-10-CM | POA: Insufficient documentation

## 2015-02-09 DIAGNOSIS — N739 Female pelvic inflammatory disease, unspecified: Secondary | ICD-10-CM | POA: Insufficient documentation

## 2015-02-09 DIAGNOSIS — R1031 Right lower quadrant pain: Secondary | ICD-10-CM

## 2015-02-09 DIAGNOSIS — Z720 Tobacco use: Secondary | ICD-10-CM | POA: Insufficient documentation

## 2015-02-09 DIAGNOSIS — D259 Leiomyoma of uterus, unspecified: Secondary | ICD-10-CM | POA: Insufficient documentation

## 2015-02-09 LAB — COMPREHENSIVE METABOLIC PANEL
ALK PHOS: 126 U/L (ref 38–126)
ALT: 28 U/L (ref 14–54)
ANION GAP: 9 (ref 5–15)
AST: 26 U/L (ref 15–41)
Albumin: 3.7 g/dL (ref 3.5–5.0)
BUN: 10 mg/dL (ref 6–20)
CALCIUM: 9.6 mg/dL (ref 8.9–10.3)
CHLORIDE: 105 mmol/L (ref 101–111)
CO2: 23 mmol/L (ref 22–32)
Creatinine, Ser: 0.68 mg/dL (ref 0.44–1.00)
Glucose, Bld: 323 mg/dL — ABNORMAL HIGH (ref 65–99)
Potassium: 3.7 mmol/L (ref 3.5–5.1)
SODIUM: 137 mmol/L (ref 135–145)
Total Bilirubin: 0.5 mg/dL (ref 0.3–1.2)
Total Protein: 7.8 g/dL (ref 6.5–8.1)

## 2015-02-09 LAB — CBC
HCT: 37 % (ref 36.0–46.0)
HEMOGLOBIN: 12 g/dL (ref 12.0–15.0)
MCH: 24.3 pg — AB (ref 26.0–34.0)
MCHC: 32.4 g/dL (ref 30.0–36.0)
MCV: 75.1 fL — ABNORMAL LOW (ref 78.0–100.0)
Platelets: 301 10*3/uL (ref 150–400)
RBC: 4.93 MIL/uL (ref 3.87–5.11)
RDW: 14.5 % (ref 11.5–15.5)
WBC: 9.7 10*3/uL (ref 4.0–10.5)

## 2015-02-09 LAB — LIPASE, BLOOD: LIPASE: 29 U/L (ref 22–51)

## 2015-02-09 LAB — URINALYSIS, ROUTINE W REFLEX MICROSCOPIC
Bilirubin Urine: NEGATIVE
Hgb urine dipstick: NEGATIVE
KETONES UR: NEGATIVE mg/dL
NITRITE: NEGATIVE
PROTEIN: NEGATIVE mg/dL
Specific Gravity, Urine: 1.041 — ABNORMAL HIGH (ref 1.005–1.030)
Urobilinogen, UA: 0.2 mg/dL (ref 0.0–1.0)
pH: 6 (ref 5.0–8.0)

## 2015-02-09 LAB — WET PREP, GENITAL
Clue Cells Wet Prep HPF POC: NONE SEEN
Trich, Wet Prep: NONE SEEN
YEAST WET PREP: NONE SEEN

## 2015-02-09 LAB — URINE MICROSCOPIC-ADD ON

## 2015-02-09 LAB — PREGNANCY, URINE: PREG TEST UR: NEGATIVE

## 2015-02-09 MED ORDER — KETOROLAC TROMETHAMINE 30 MG/ML IJ SOLN
30.0000 mg | Freq: Once | INTRAMUSCULAR | Status: AC
  Administered 2015-02-09: 30 mg via INTRAVENOUS
  Filled 2015-02-09: qty 1

## 2015-02-09 NOTE — ED Notes (Signed)
Patient transported to Ultrasound 

## 2015-02-09 NOTE — ED Provider Notes (Signed)
CSN: [id number]     Arrival date & time 02/09/15  1809 History   First MD Initiated Contact with Patient 02/09/15 2009     Chief Complaint  Patient presents with  . Abdominal Pain    (Consider location/radiation/quality/duration/timing/severity/associated sxs/prior Treatment) HPI Comments: 34 year old G76P4 female with a history of gestational diabetes and asthma presents to the emergency department for persistent right lower quadrant pain. Patient states that symptoms began intermittent 2 months ago and had slowly become constant with time. She reports taking Aleve for symptoms which provided some mild improvement. She reports that her pain is worsened when having a bowel movement. She also notes some worsening white vaginal discharge with some vaginal irritation. Patient also reports dysuria without hematuria with her symptoms. Patient denies any fever, nausea, vomiting, diarrhea, melanoma or hematochezia. Her last menstrual period was 3 weeks ago. She states that she was sexually active with her fianc up until February. She did not engage in sexual intercourse until 2 months ago, but she has been using condoms during every sexual encounter. Abdominal surgical history significant for cesarean section.  Patient is a 34 y.o. female presenting with abdominal pain. The history is provided by the patient. No language interpreter was used.  Abdominal Pain Associated symptoms: dysuria and vaginal discharge   Associated symptoms: no fever, no hematuria, no nausea and no vomiting     Past Medical History  Diagnosis Date  . Diabetes mellitus without complication     GDM  . Gestational diabetes     G2 & G5, takes insulin  . Asthma     does not use inhaler   Past Surgical History  Procedure Laterality Date  . No past surgeries    . Cesarean section N/A 07/01/2013    Procedure: Primary Cesarean Section Delivery Baby Boy @ 2038, Apgars 7/9;  Surgeon: Jonnie Kind, MD;  Location: [facility];  Service:  Obstetrics;  Laterality: N/A;   Family History  Problem Relation Age of Onset  . Diabetes Mother   . Hypertension Mother   . Arthritis Mother   . Hyperlipidemia Father    Social History  Substance Use Topics  . Smoking status: Current Every Day Smoker  . Smokeless tobacco: Never Used  . Alcohol Use: No   OB History    Gravida Para Term Preterm AB TAB SAB Ectopic Multiple Living   5 4 4  1 1    4       Review of Systems  Constitutional: Negative for fever.  Gastrointestinal: Positive for abdominal pain. Negative for nausea and vomiting.  Genitourinary: Positive for dysuria and vaginal discharge. Negative for hematuria.  Neurological: Negative for syncope.  All other systems reviewed and are negative.   Allergies  Review of patient's allergies indicates no known allergies.  Home Medications   Prior to Admission medications   Medication Sig Start Date End Date Taking? Authorizing Provider  naproxen sodium (ANAPROX) 220 MG tablet Take 220 mg by mouth daily as needed. For pain   Yes Historical Provider, MD  doxycycline (VIBRAMYCIN) 100 MG capsule Take 1 capsule (100 mg total) by mouth 2 (two) times daily. 02/10/15   Antonietta Breach, PA-C  HYDROcodone-acetaminophen (NORCO/VICODIN) 5-325 MG per tablet Take 1-2 tablets by mouth every 6 (six) hours as needed for severe pain. 02/10/15   Antonietta Breach, PA-C  meloxicam (MOBIC) 15 MG tablet One tab PO qAM with breakfast for 2 weeks, then daily prn pain. Patient not taking: Reported on 09/28/2014 08/26/14   Marcello Moores  Ferdinand Lango, MD  metroNIDAZOLE (FLAGYL) 500 MG tablet Take 1 tablet (500 mg total) by mouth 2 (two) times daily. Do not drink alcohol while taking 02/10/15   Antonietta Breach, PA-C   BP 109/60 mmHg  Pulse 69  Temp(Src) 98.4 F (36.9 C) (Oral)  Resp 16  SpO2 99%  LMP 01/19/2015   Physical Exam  Constitutional: She is oriented to person, place, and time. She appears well-developed and well-nourished. No distress.  Nontoxic/nonseptic  appearing. Patient does seem uncomfortable.  HENT:  Head: Normocephalic and atraumatic.  Eyes: Conjunctivae and EOM are normal. No scleral icterus.  Neck: Normal range of motion.  Cardiovascular: Normal rate, regular rhythm and intact distal pulses.   Pulmonary/Chest: Effort normal and breath sounds normal. No respiratory distress. She has no wheezes. She has no rales.  Respirations even and unlabored. Lungs clear.  Abdominal: Soft. She exhibits no distension. There is tenderness. There is no rebound and no guarding.  Focal TTP in the RLQ without masses or rebound. Abdomen soft and obese. No peritoneal signs.  Genitourinary: There is no rash, tenderness, lesion or injury on the right labia. There is no rash, tenderness, lesion or injury on the left labia. Uterus is not tender. Cervix exhibits friability. Cervix exhibits no motion tenderness. Right adnexum displays tenderness. Right adnexum displays no mass and no fullness. Left adnexum displays no mass, no tenderness and no fullness. No bleeding in the vagina. No foreign body around the vagina. Vaginal discharge found.  Copious white clumped and creamy discharge in the vaginal vault. Mild cervical friability. No CMT; R adnexal TTP present.  Musculoskeletal: Normal range of motion.  Neurological: She is alert and oriented to person, place, and time. She exhibits normal muscle tone. Coordination normal.  GCS 15. Patient moving all extremities.  Skin: Skin is warm and dry. No rash noted. She is not diaphoretic. No erythema. No pallor.  Psychiatric: She has a normal mood and affect. Her behavior is normal.  Nursing note and vitals reviewed.   ED Course  Procedures (including critical care time) Labs Review Labs Reviewed  WET PREP, GENITAL - Abnormal; Notable for the following:    WBC, Wet Prep HPF POC MANY (*)    All other components within normal limits  COMPREHENSIVE METABOLIC PANEL - Abnormal; Notable for the following:    Glucose, Bld 323  (*)    All other components within normal limits  CBC - Abnormal; Notable for the following:    MCV 75.1 (*)    MCH 24.3 (*)    All other components within normal limits  URINALYSIS, ROUTINE W REFLEX MICROSCOPIC (NOT AT [facility]) - Abnormal; Notable for the following:    APPearance HAZY (*)    Specific Gravity, Urine 1.041 (*)    Glucose, UA >1000 (*)    Leukocytes, UA SMALL (*)    All other components within normal limits  URINE MICROSCOPIC-ADD ON - Abnormal; Notable for the following:    Squamous Epithelial / LPF FEW (*)    Bacteria, UA FEW (*)    All other components within normal limits  URINE CULTURE  LIPASE, BLOOD  PREGNANCY, URINE  GC/CHLAMYDIA PROBE AMP ([place]) NOT AT [facility]    Imaging Review US Transvaginal Non-ob  02/09/2015   CLINICAL DATA:  Right lower quadrant and pelvic pain. Right adnexal tenderness. Clinical suspicion for ovarian torsion.  EXAM: TRANSABDOMINAL AND TRANSVAGINAL ULTRASOUND OF PELVIS  DOPPLER ULTRASOUND OF OVARIES  TECHNIQUE: Both transabdominal and transvaginal ultrasound examinations of the pelvis  were performed. Transabdominal technique was performed for global imaging of the pelvis including uterus, ovaries, adnexal regions, and pelvic cul-de-sac.  It was necessary to proceed with endovaginal exam following the transabdominal exam to visualize the endometrium and ovaries. Color and duplex Doppler ultrasound was utilized to evaluate blood flow to the ovaries.  COMPARISON:  None.  FINDINGS: Uterus  Measurements: 10.7 x 5.1 x 5.0 cm. A 3.4 cm intramural fibroid is seen in the posterior corpus with a submucosal component indenting the endometrial stripe. A 1 cm intramural fibroid is also seen in the anterior lower uterine segment.  Endometrium  Thickness: 12 mm.  No focal abnormality visualized.  Right ovary  Measurements: 2.2 x 1.5 x 2.2 cm. Normal appearance/no adnexal mass.  Left ovary  Measurements: 2.7 x 0.9 x 2.2 cm. Normal appearance/no adnexal mass.   Pulsed Doppler evaluation of both ovaries demonstrates normal low-resistance arterial and venous waveforms.  Other findings  No free fluid.  IMPRESSION: Uterine fibroids, largest measuring 3.4 cm.  Normal appearance of both ovaries.  No adnexal mass identified.  No sonographic evidence for ovarian torsion.   Electronically Signed   By: Earle Gell M.D.   On: 02/09/2015 22:55   US Pelvis Complete  02/09/2015   CLINICAL DATA:  Right lower quadrant and pelvic pain. Right adnexal tenderness. Clinical suspicion for ovarian torsion.  EXAM: TRANSABDOMINAL AND TRANSVAGINAL ULTRASOUND OF PELVIS  DOPPLER ULTRASOUND OF OVARIES  TECHNIQUE: Both transabdominal and transvaginal ultrasound examinations of the pelvis were performed. Transabdominal technique was performed for global imaging of the pelvis including uterus, ovaries, adnexal regions, and pelvic cul-de-sac.  It was necessary to proceed with endovaginal exam following the transabdominal exam to visualize the endometrium and ovaries. Color and duplex Doppler ultrasound was utilized to evaluate blood flow to the ovaries.  COMPARISON:  None.  FINDINGS: Uterus  Measurements: 10.7 x 5.1 x 5.0 cm. A 3.4 cm intramural fibroid is seen in the posterior corpus with a submucosal component indenting the endometrial stripe. A 1 cm intramural fibroid is also seen in the anterior lower uterine segment.  Endometrium  Thickness: 12 mm.  No focal abnormality visualized.  Right ovary  Measurements: 2.2 x 1.5 x 2.2 cm. Normal appearance/no adnexal mass.  Left ovary  Measurements: 2.7 x 0.9 x 2.2 cm. Normal appearance/no adnexal mass.  Pulsed Doppler evaluation of both ovaries demonstrates normal low-resistance arterial and venous waveforms.  Other findings  No free fluid.  IMPRESSION: Uterine fibroids, largest measuring 3.4 cm.  Normal appearance of both ovaries.  No adnexal mass identified.  No sonographic evidence for ovarian torsion.   Electronically Signed   By: Earle Gell M.D.    On: 02/09/2015 22:55   [place] Art/ven Flow Abd Pelv Doppler  02/09/2015   CLINICAL DATA:  Right lower quadrant and pelvic pain. Right adnexal tenderness. Clinical suspicion for ovarian torsion.  EXAM: TRANSABDOMINAL AND TRANSVAGINAL ULTRASOUND OF PELVIS  DOPPLER ULTRASOUND OF OVARIES  TECHNIQUE: Both transabdominal and transvaginal ultrasound examinations of the pelvis were performed. Transabdominal technique was performed for global imaging of the pelvis including uterus, ovaries, adnexal regions, and pelvic cul-de-sac.  It was necessary to proceed with endovaginal exam following the transabdominal exam to visualize the endometrium and ovaries. Color and duplex Doppler ultrasound was utilized to evaluate blood flow to the ovaries.  COMPARISON:  None.  FINDINGS: Uterus  Measurements: 10.7 x 5.1 x 5.0 cm. A 3.4 cm intramural fibroid is seen in the posterior corpus with a submucosal component indenting the  endometrial stripe. A 1 cm intramural fibroid is also seen in the anterior lower uterine segment.  Endometrium  Thickness: 12 mm.  No focal abnormality visualized.  Right ovary  Measurements: 2.2 x 1.5 x 2.2 cm. Normal appearance/no adnexal mass.  Left ovary  Measurements: 2.7 x 0.9 x 2.2 cm. Normal appearance/no adnexal mass.  Pulsed Doppler evaluation of both ovaries demonstrates normal low-resistance arterial and venous waveforms.  Other findings  No free fluid.  IMPRESSION: Uterine fibroids, largest measuring 3.4 cm.  Normal appearance of both ovaries.  No adnexal mass identified.  No sonographic evidence for ovarian torsion.   Electronically Signed   By: Earle Gell M.D.   On: 02/09/2015 22:55   I have personally reviewed and evaluated these images and lab results as part of my medical decision-making.   EKG Interpretation None      2100 - Patient informed of hyperglycemia today. Discussed need to recheck a fasting sugar with a PCP as well as the importance of diet and exercise in lessening diabetes  risk. MDM   Final diagnoses:  Uterine leiomyoma, unspecified location  PID (pelvic inflammatory disease)  Hyperglycemia    34 year old female presents to the emergency department for further evaluation of 2 months of right lower quadrant abdominal pain with associated white vaginal discharge. She has had some mild burning with urination. There is pyuria today on urinalysis, but few bacteria and negative nitrites. Suspect that this may be secondary to contamination by vaginal discharge as discharge on exam is significant and wet positive for many WBCs. Urine culture sent. Ultrasound obtained to evaluate for TOA. Ultrasound reveals uterine fibroids up to 3.4 cm. No evidence of ovarian cyst or abscess.  Pain may be secondary to fibroids, but will also cover for PID. Doubt appendicitis given chronicity of symptoms, lack of fever, and lack of leukocytosis. Also doubt diverticulitis or ruptured viscous. Patient has been having daily BMs; no N/V. Patient tx in ED with Rocephin. Will d/c with Doxycycline and Flagyl and instruction to f/u with an OBGYN. Referral given to [facility]. Return precautions discussed and provided. Patient agreeable to plan with no unaddressed concerns. Patient discharged in good condition; VSS.   Filed Vitals:   02/09/15 2245 02/09/15 2300 02/09/15 2315 02/09/15 2330  BP: 110/66 99/63 108/82 109/60  Pulse: 68 70 76 69  Temp:      TempSrc:      Resp:      SpO2: 100% 100% 99% 99%     Antonietta Breach, PA-C 02/10/15 0028  Evelina Bucy, MD 02/10/15 [phone]

## 2015-02-09 NOTE — ED Notes (Signed)
RLQ pain, white vaginal discharge, dizziness, and burning with urination x 2 months.  Denies nausea and vomiting.

## 2015-02-10 LAB — GC/CHLAMYDIA PROBE AMP (~~LOC~~) NOT AT ARMC
Chlamydia: NEGATIVE
Neisseria Gonorrhea: NEGATIVE

## 2015-02-10 MED ORDER — HYDROCODONE-ACETAMINOPHEN 5-325 MG PO TABS: 1.0000 | ORAL_TABLET | Freq: Four times a day (QID) | ORAL | Status: DC | PRN

## 2015-02-10 MED ORDER — DOXYCYCLINE HYCLATE 100 MG PO CAPS: 100.0000 mg | ORAL_CAPSULE | Freq: Two times a day (BID) | ORAL | Status: DC

## 2015-02-10 MED ORDER — LIDOCAINE HCL (PF) 1 % IJ SOLN
INTRAMUSCULAR | Status: AC
  Administered 2015-02-10: 5 mL
  Filled 2015-02-10: qty 5

## 2015-02-10 MED ORDER — CEFTRIAXONE SODIUM 250 MG IJ SOLR
250.0000 mg | Freq: Once | INTRAMUSCULAR | Status: AC
  Administered 2015-02-10: 250 mg via INTRAMUSCULAR
  Filled 2015-02-10: qty 250

## 2015-02-10 MED ORDER — METRONIDAZOLE 500 MG PO TABS: 500.0000 mg | ORAL_TABLET | Freq: Two times a day (BID) | ORAL | Status: DC

## 2015-02-10 NOTE — Discharge Instructions (Signed)
Take doxycycline and Flagyl as prescribed for symptoms. Do not drink alcohol while taking Flagyl as this may cause you to violently vomit. You may take Norco as needed for pain that is not controlled with ibuprofen or Aleve. Follow-up with an OB/GYN for recheck of symptoms. Return to the emergency department as needed if symptoms worsen.   Uterine Fibroid A uterine fibroid is a growth (tumor) that occurs in your uterus. This type of tumor is not cancerous and does not spread out of the uterus. You can have one or many fibroids. Fibroids can vary in size, weight, and where they grow in the uterus. Some can become quite large. Most fibroids do not require medical treatment, but some can cause pain or heavy bleeding during and between periods. CAUSES  A fibroid is the result of a single uterine cell that keeps growing (unregulated), which is different than most cells in the human body. Most cells have a control mechanism that keeps them from reproducing without control.  SIGNS AND SYMPTOMS   Bleeding.  Pelvic pain and pressure.  Bladder problems due to the size of the fibroid.  Infertility and miscarriages depending on the size and location of the fibroid. DIAGNOSIS  Uterine fibroids are diagnosed through a physical exam. Your health care provider may feel the lumpy tumors during a pelvic exam. Ultrasonography may be done to get information regarding size, location, and number of tumors.  TREATMENT   Your health care provider may recommend watchful waiting. This involves getting the fibroid checked by your health care provider to see if it grows or shrinks.   Hormone treatment or an intrauterine device (IUD) may be prescribed.   Surgery may be needed to remove the fibroids (myomectomy) or the uterus (hysterectomy). This depends on your situation. When fibroids interfere with fertility and a woman wants to become pregnant, a health care provider may recommend having the fibroids removed.    [place] care depends on how you were treated. In general:   Keep all follow-up appointments with your health care provider.   Only take over-the-counter or prescription medicines as directed by your health care provider. If you were prescribed a hormone treatment, take the hormone medicines exactly as directed. Do not take aspirin. It can cause bleeding.   Talk to your health care provider about taking iron pills.  If your periods are troublesome but not so heavy, lie down with your feet raised slightly above your heart. Place cold packs on your lower abdomen.   If your periods are heavy, write down the number of pads or tampons you use per month. Bring this information to your health care provider.   Include green vegetables in your diet.  SEEK IMMEDIATE MEDICAL CARE IF:  You have pelvic pain or cramps not controlled with medicines.   You have a sudden increase in pelvic pain.   You have an increase in bleeding between and during periods.   You have excessive periods and soak tampons or pads in a half hour or less.  You feel lightheaded or have fainting episodes. Document Released: 06/08/2000 Document Revised: 04/01/2013 Document Reviewed: 01/08/2013 [facility] Patient Information 2015 [place], [place]. This information is not intended to replace advice given to you by your health care provider. Make sure you discuss any questions you have with your health care provider.  Pelvic Inflammatory Disease Pelvic inflammatory disease (PID) refers to an infection in some or all of the female organs. The infection can be in the  uterus, ovaries, fallopian tubes, or the surrounding tissues in the pelvis. PID can cause abdominal or pelvic pain that comes on suddenly (acute pelvic pain). PID is a serious infection because it can lead to lasting (chronic) pelvic pain or the inability to have children (infertile).  CAUSES  The infection is often caused by the normal  bacteria found in the vaginal tissues. PID may also be caused by an infection that is spread during sexual contact. PID can also occur following:   The birth of a baby.   A miscarriage.   An abortion.   Major pelvic surgery.   The use of an intrauterine device (IUD).   A sexual assault.  RISK FACTORS Certain factors can put a person at higher risk for PID, such as:  Being younger than 25 years.  Being sexually active at [place] age.  Usingnonbarrier contraception.  Havingmultiple sexual partners.  Having sex with someone who has symptoms of a genital infection.  Using oral contraception. Other times, certain behaviors can increase the possibility of getting PID, such as:  Having sex during your period.  Using a vaginal douche.  Having an intrauterine device (IUD) in place. SYMPTOMS   Abdominal or pelvic pain.   Fever.   Chills.   Abnormal vaginal discharge.  Abnormal uterine bleeding.   Unusual pain shortly after finishing your period. DIAGNOSIS  Your caregiver will choose some of the following methods to make a diagnosis, such as:   Performinga physical exam and history. A pelvic exam typically reveals a very tender uterus and surrounding pelvis.   Ordering laboratory tests including a pregnancy test, blood tests, and urine test.  Orderingcultures of the vagina and cervix to check for a sexually transmitted infection (STI).  Performing an ultrasound.   Performing a laparoscopic procedure to look inside the pelvis.  TREATMENT   Antibiotic medicines may be prescribed and taken by mouth.   Sexual partners may be treated when the infection is caused by a sexually transmitted disease (STD).   Hospitalization may be needed to give antibiotics intravenously.  Surgery may be needed, but this is rare. It may take weeks until you are completely well. If you are diagnosed with PID, you should also be checked for human immunodeficiency  virus (HIV). HOME CARE INSTRUCTIONS   If given, take your antibiotics as directed. Finish the medicine even if you start to feel better.   Only take over-the-counter or prescription medicines for pain, discomfort, or fever as directed by your caregiver.   Do not have sexual intercourse until treatment is completed or as directed by your caregiver. If PID is confirmed, your recent sexual partner(s) will need treatment.   Keep your follow-up appointments. SEEK MEDICAL CARE IF:   You have increased or abnormal vaginal discharge.   You need prescription medicine for your pain.   You vomit.   You cannot take your medicines.   Your partner has an STD.  SEEK IMMEDIATE MEDICAL CARE IF:   You have a fever.   You have increased abdominal or pelvic pain.   You have chills.   You have pain when you urinate.   You are not better after 72 hours following treatment.  MAKE SURE YOU:   Understand these instructions.  Will watch your condition.  Will get help right away if you are not doing well or get worse. Document Released: 06/11/2005 Document Revised: 10/06/2012 Document Reviewed: 06/07/2011 [facility] Patient Information 2015 [place], [place]. This information is not intended to replace advice given  to you by your health care provider. Make sure you discuss any questions you have with your health care provider.

## 2015-02-12 LAB — URINE CULTURE

## 2015-02-14 ENCOUNTER — Telehealth (HOSPITAL_COMMUNITY): Payer: Self-pay

## 2015-02-14 NOTE — Telephone Encounter (Signed)
Post ED Visit - Positive Culture Follow-up  Culture report reviewed by antimicrobial stewardship pharmacist: []  Wes Ashley, Pharm.D., BCPS []  Heide Guile, Pharm.D., BCPS []  Alycia Rossetti, Pharm.D., BCPS []  [place], Pharm.D., BCPS, AAHIVP []  Legrand Como, Pharm.D., BCPS, AAHIVP []  Isac Sarna, Pharm.D., BCPS X  Anette Guarneri, Pharm.D.  Positive Urine culture, >/= 100,000 colonies -> E Coli Treated with Doxycycline, organism sensitive to the same and no further patient follow-up is required at this time.  Dortha Kern 02/14/2015, 5:25 AM

## 2015-03-09 IMAGING — US US OB FOLLOW-UP
1 series · 12 of 28 positions shown · non-contrast
Comparison: none

[Series 1: us ob follow up · 12 of 53 slices shown]
[im 2/53]
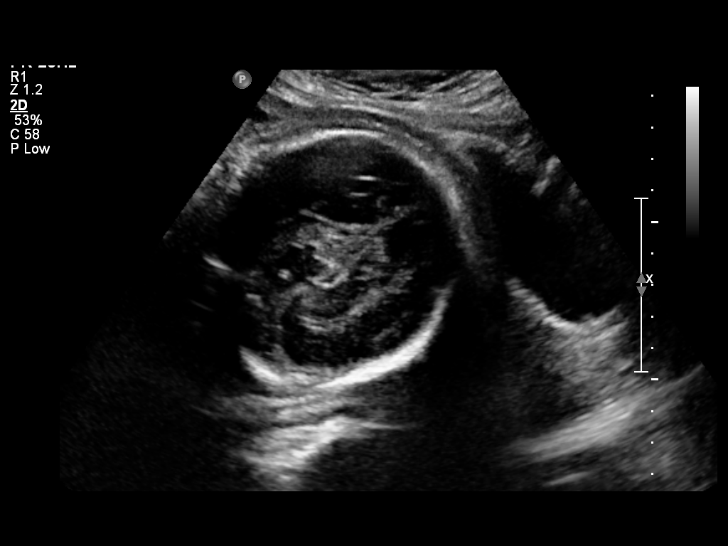
[im 6/53]
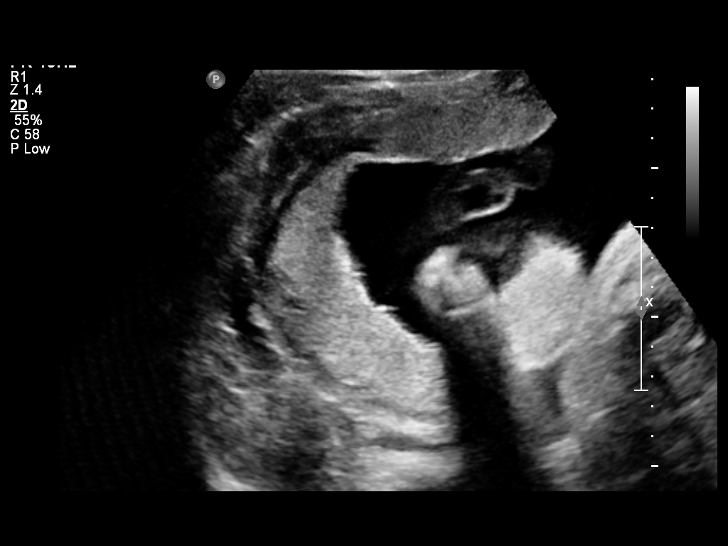
[im 10/53]
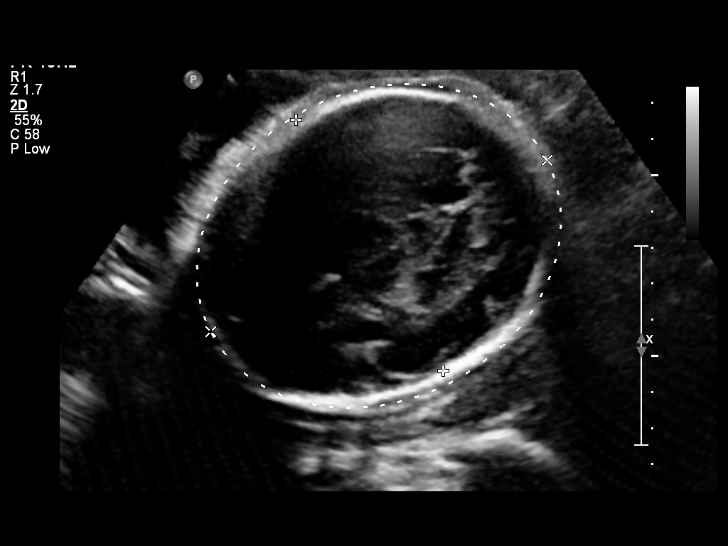
[im 16/53]
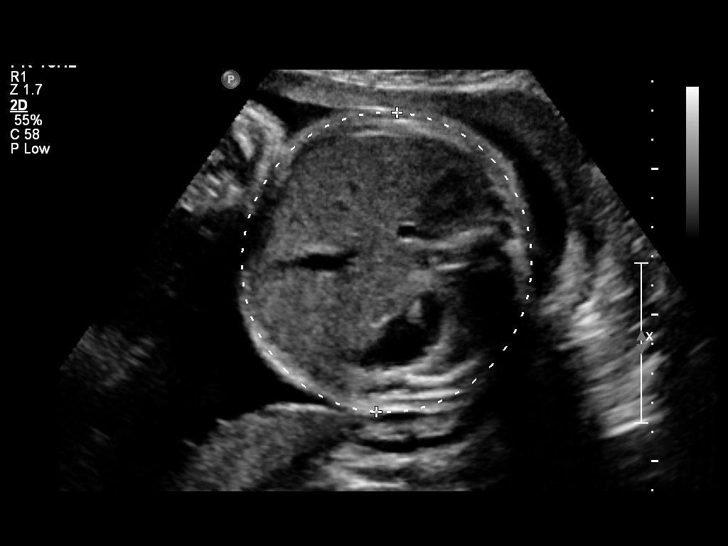
[im 20/53]
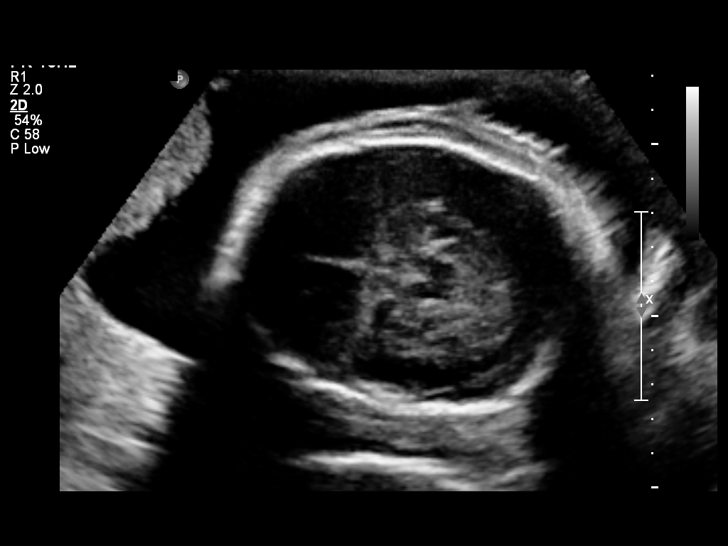
[im 24/53]
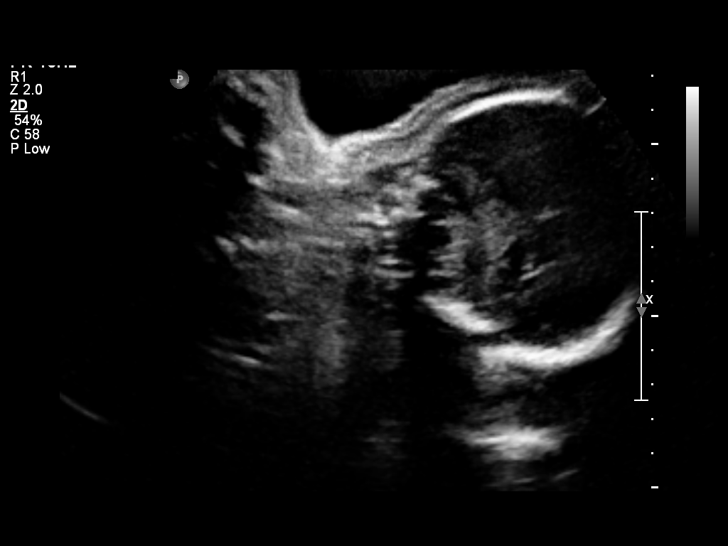
[im 29/53]
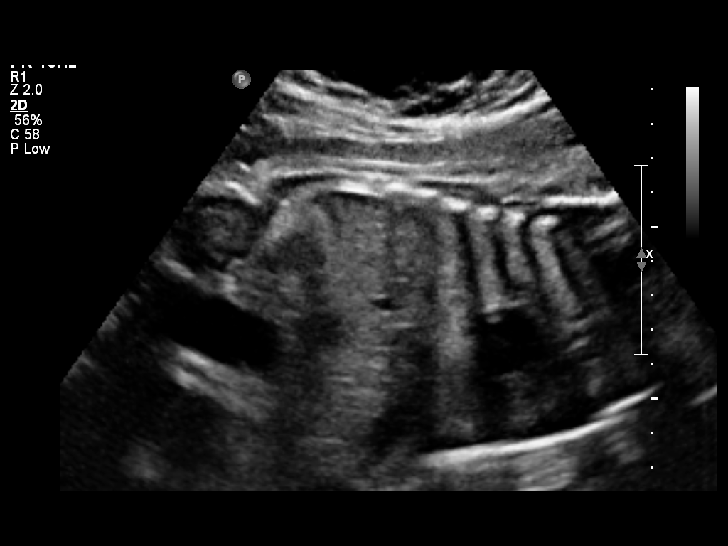
[im 33/53]
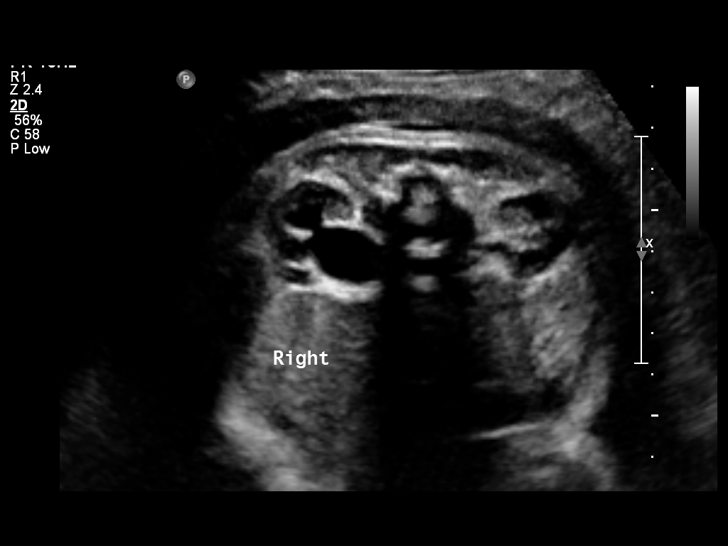
[im 37/53]
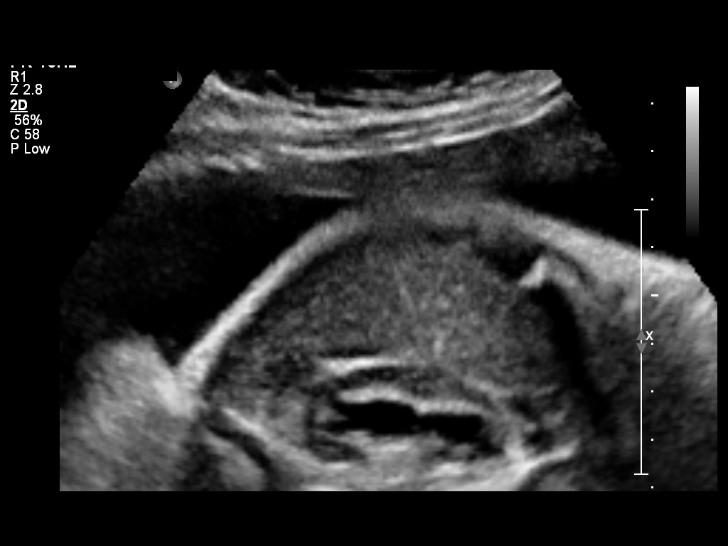
[im 43/53]
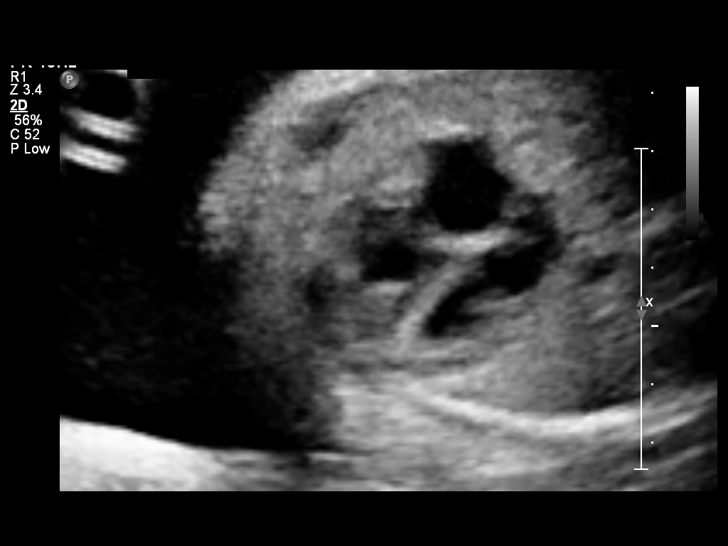
[im 47/53]
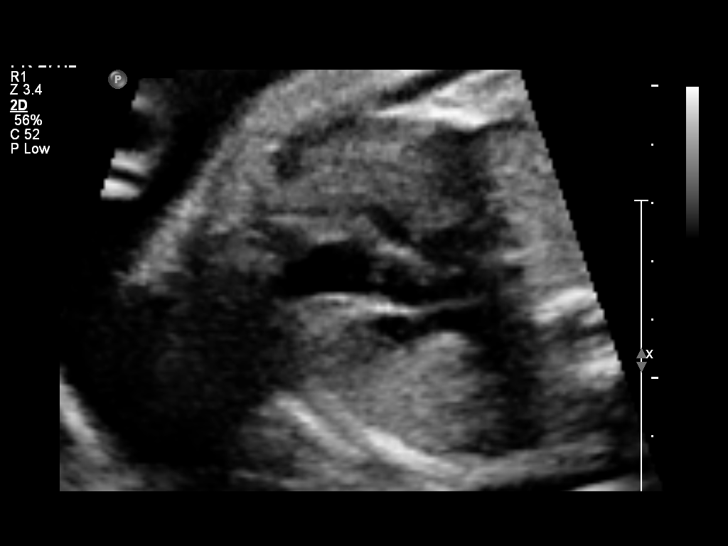
[im 51/53]
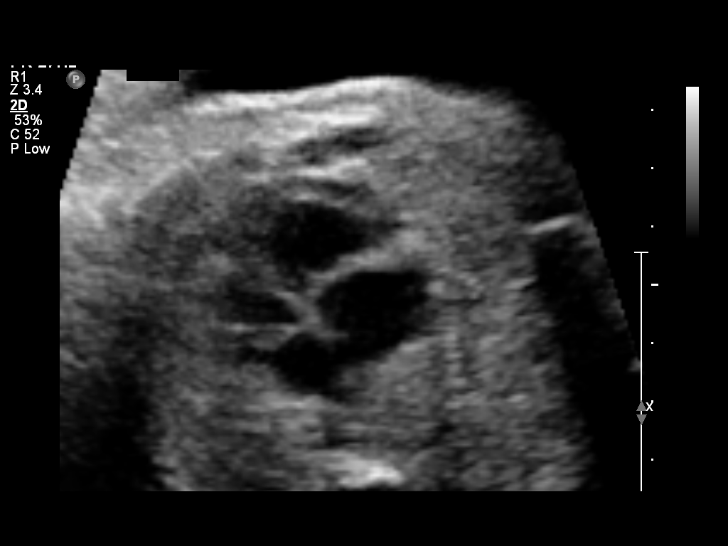

[12 of 28 positions shown; findings below may reference images not displayed]

OBSTETRICS REPORT
                      (Signed Final 05/14/2013 [DATE])

Service(s) Provided

 US OB FOLLOW UP                                       76816.1
Indications

 Diabetes - Gestational, A2 (medication controlled
 Glyburide & Metformin)
 Pyelectasis of fetus on prenatal ultrasound           655.83 593.89,

Fetal Evaluation

 Num Of Fetuses:    1
 Fetal Heart Rate:  158                          bpm
 Cardiac Activity:  Observed
 Presentation:      Cephalic
 Placenta:          Right lateral, above
                    cervical os

 Amniotic Fluid
 AFI FV:      Subjectively within normal limits
 AFI Sum:     17.81   cm       66  %Tile     Larg Pckt:    7.22  cm
 RUQ:   2.03    cm   RLQ:    5.01   cm    LUQ:   7.22    cm   LLQ:    3.55   cm
Biometry

 BPD:     79.4  mm     G. Age:  31w 6d                CI:        70.37   70 - 86
                                                      FL/HC:      19.6   19.1 -

 HC:     301.8  mm     G. Age:  33w 3d       49  %    HC/AC:      0.95   0.96 -

 AC:       317  mm     G. Age:  35w 4d     > 97  %    FL/BPD:     74.7   71 - 87
 FL:      59.3  mm     G. Age:  30w 6d       11  %    FL/AC:      18.7   20 - 24
 HUM:     55.1  mm     G. Age:  32w 0d       50  %
 CER:     38.2  mm     G. Age:  32w 4d       59  %

 Est. FW:    4486  gm           5 lb     81  %
Gestational Age

 U/S Today:     33w 0d                                        EDD:   07/02/13
 Best:          32w 1d     Det. By:  Early Ultrasound         EDD:   07/08/13
                                     (12/25/12)
Anatomy
 Cranium:          Appears normal         Aortic Arch:      Basic anatomy
                                                            exam per order
 Fetal Cavum:      Previously seen        Ductal Arch:      Basic anatomy
                                                            exam per order
 Ventricles:       Appears normal         Diaphragm:        Appears normal
 Choroid Plexus:   Previously seen        Stomach:          Appears normal, left
                                                            sided
 Cerebellum:       Appears normal         Abdomen:          Appears normal
 Posterior Fossa:  Appears normal         Abdominal Wall:   Previously seen
 Nuchal Fold:      Previously seen        Cord Vessels:     Appears normal (3
                                                            vessel cord)
 Face:             Basic anatomy          Kidneys:          Right sided
                   exam per order
                                                            pyelectasis,  14
                                                            mm
 Lips:             Appears normal         Bladder:          Appears normal
 Heart:            Appears normal         Spine:            Previously seen
                   (4CH, axis, and
                   situs)
 RVOT:             Appears normal         Lower             Previously seen
                                          Extremities:
 LVOT:             Appears normal         Upper             Previously seen
                                          Extremities:

 Other:  Fetus appears to be a male. Technically difficult due to fetal position.
Cervix Uterus Adnexa

 Cervix:       Not visualized (advanced GA >57wks)

 Left Ovary:    Not visualized.
 Right Ovary:   Previously seen
Impression

 Single IUP at 32 [DATE] weeks
 Right renal pylectasis (14 mm) with cayceal dilation (Grade 2
 hydronephrosis)
 Hydroureter is not appreciated - likely UPJ obstruction
 The left kidney appears normal
 Normal amniotic fluid volume
 Fetal growth at the 81st %tile.  The AC measures > 97th %tile.
Recommendations

 Recommend follow up in 4 weeks for interval growth (please
 schedule)
 Notify Peds at time of delivery - will need ultrasound
 evaluation of the newborn after delivery and likely Peds
 urology consultation during the first several weeks after birth.

 questions or concerns.

## 2015-03-17 IMAGING — US US FETAL BPP W/O NONSTRESS
1 series · 13 of 25 positions shown · non-contrast
Comparison: none

[Series 1: us fetal bpp w/o nonstress · non-contrast · 25 acquisitions, 13 frames shown]
[im 1/25]
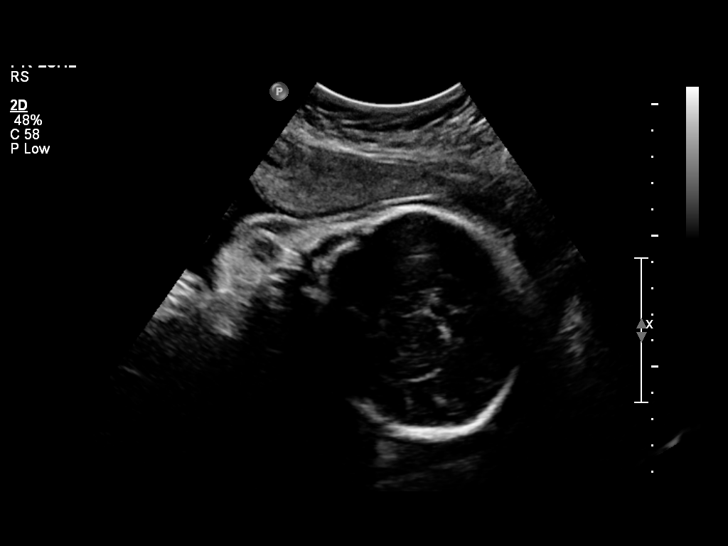
[im 3/25]
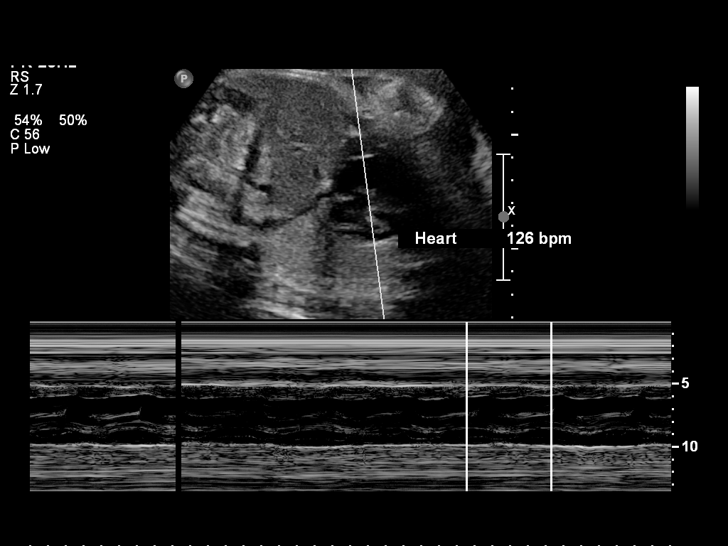
[im 5/25]
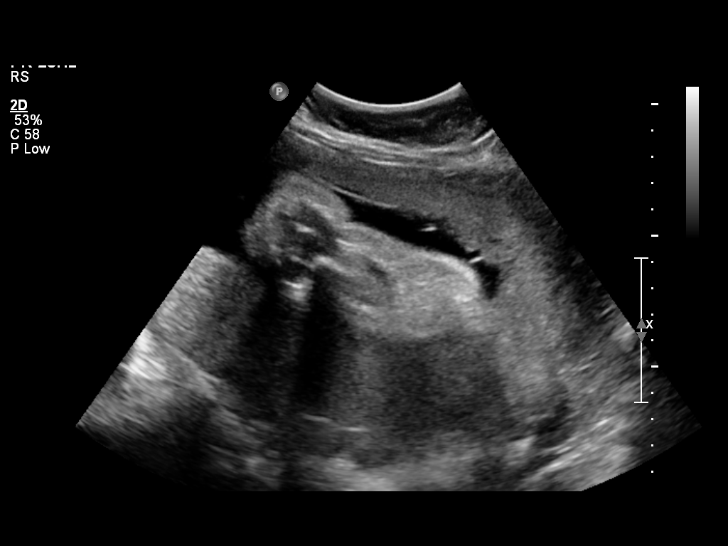
[im 7/25]
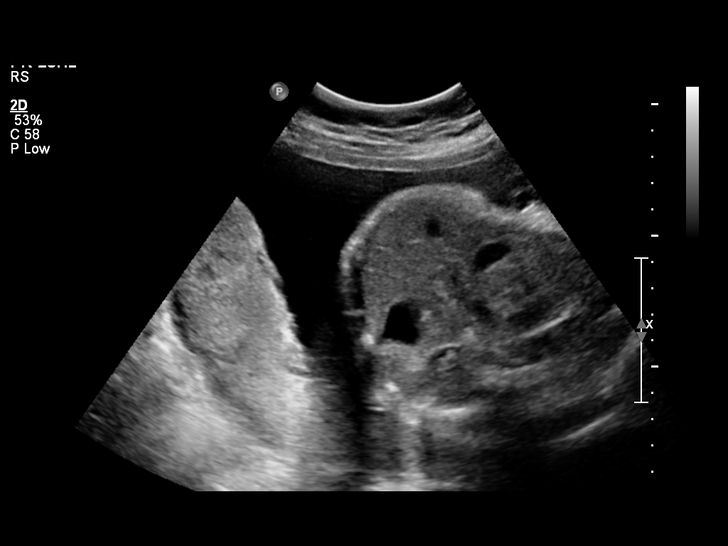
[im 9/25]
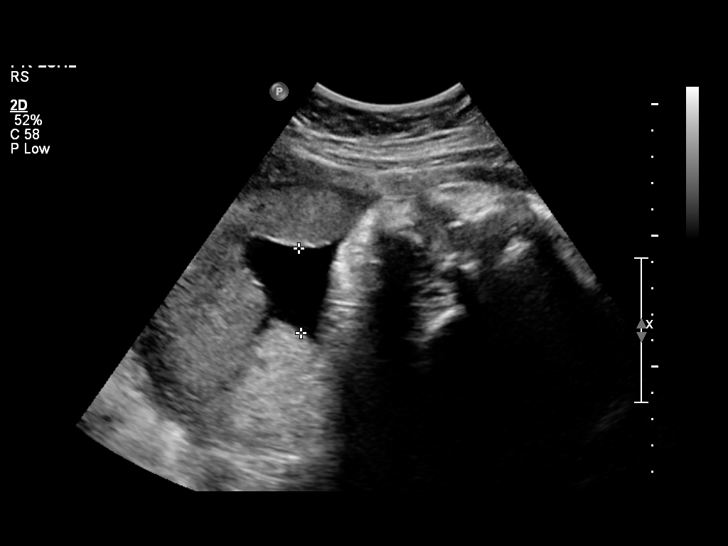
[im 11/25]
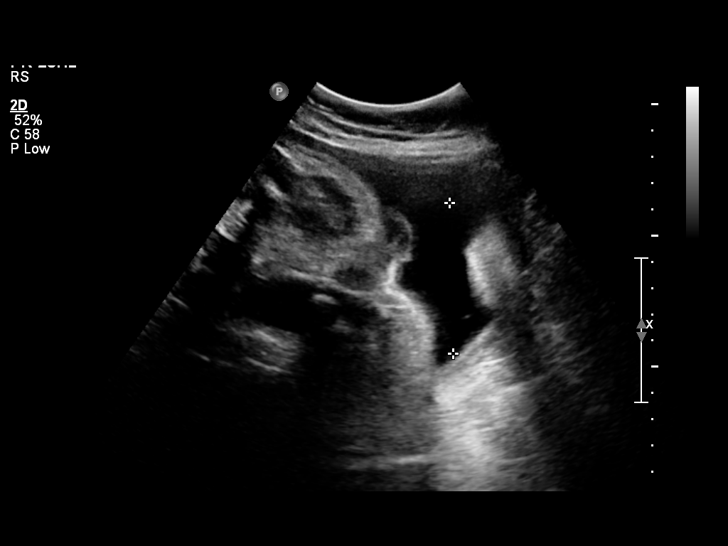
[im 13/25]
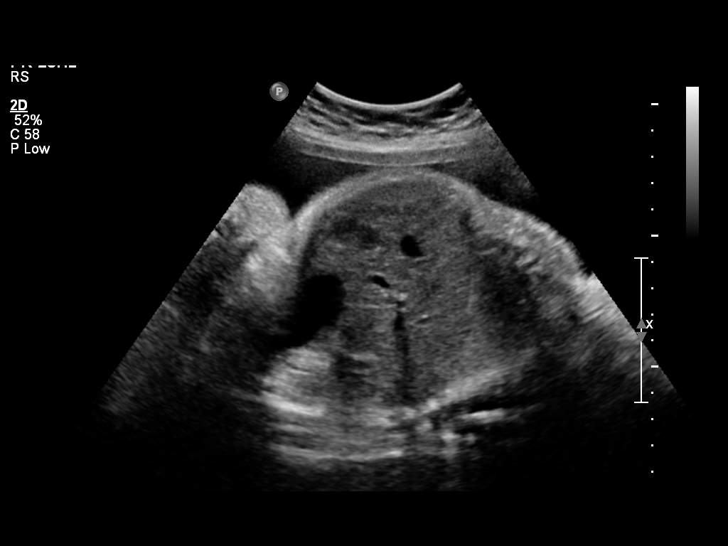
[im 15/25]
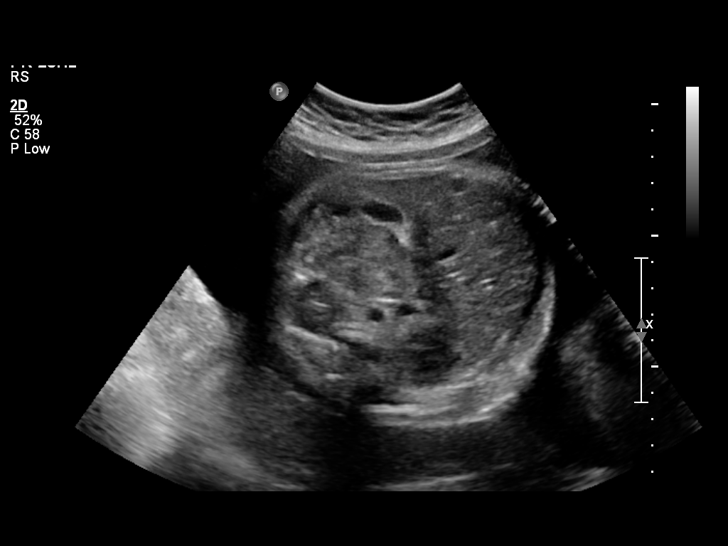
[im 17/25]
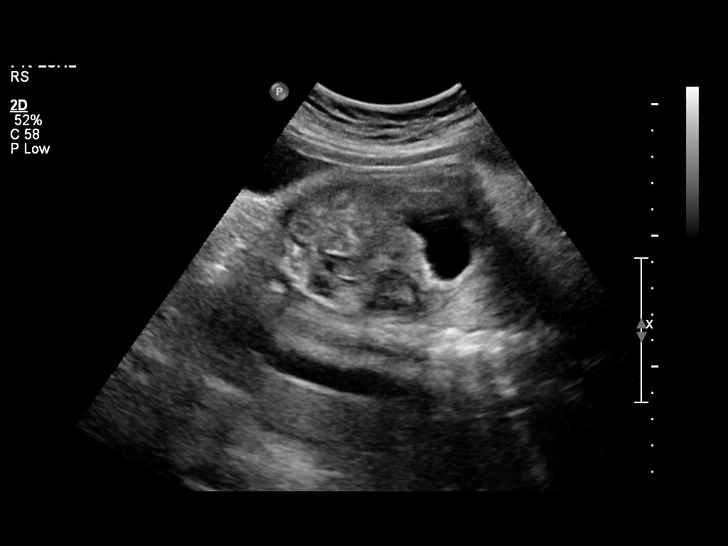
[im 19/25]
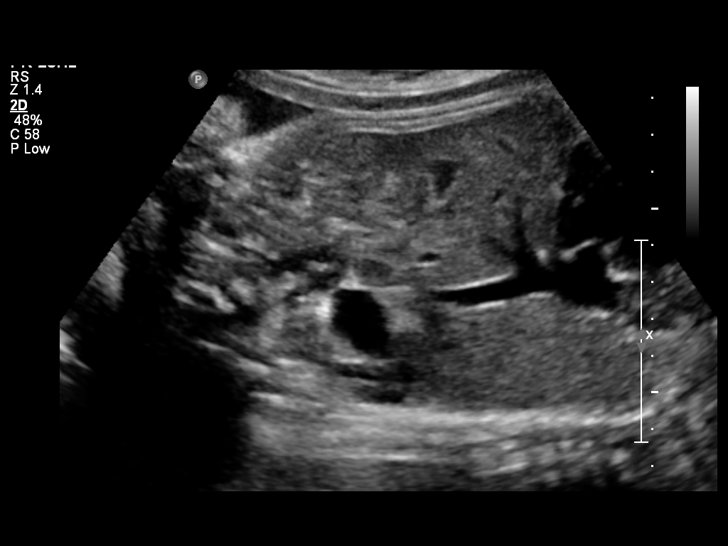
[im 21/25]
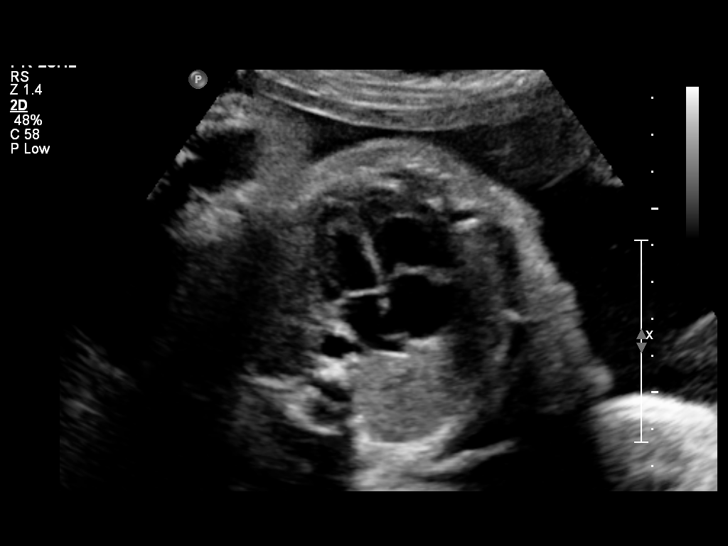
[im 23/25]
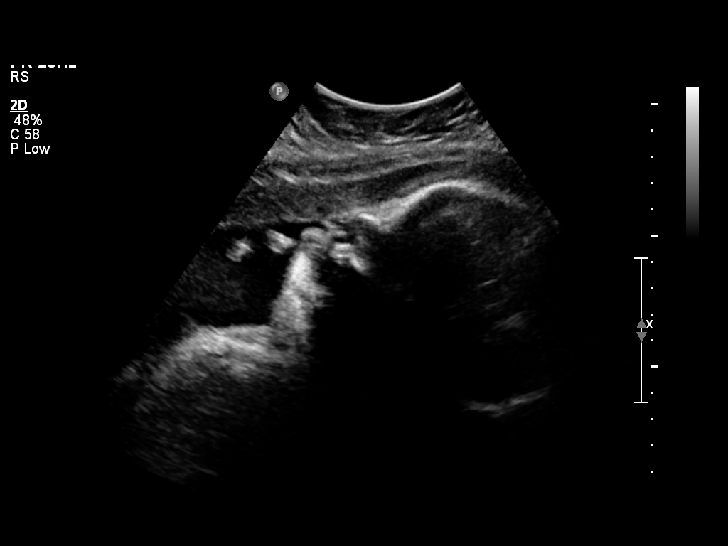
[im 25/25]
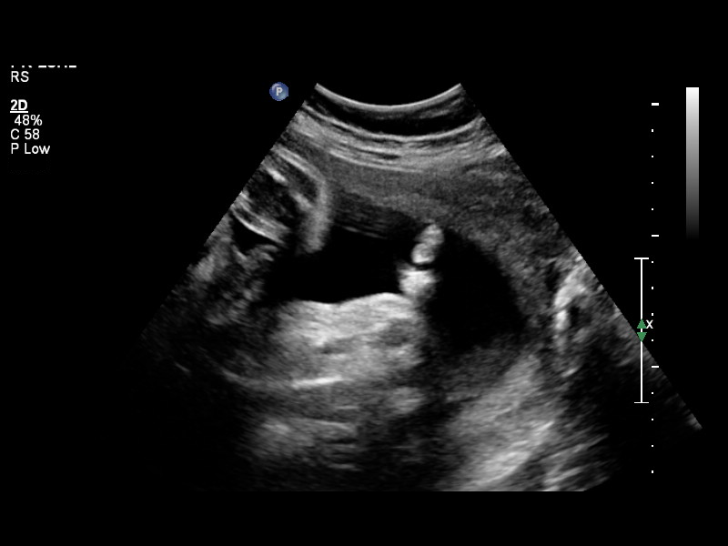

[13 of 25 positions shown; findings below may reference images not displayed]

OBSTETRICS REPORT
                      (Signed Final 05/22/2013 [DATE])

Service(s) Provided

Indications

 Diabetes - Gestational, A2 (medication controlled
 Glyburide & Metformin)
 Pyelectasis of fetus on prenatal ultrasound           655.83 593.89,

Fetal Evaluation

 Num Of Fetuses:    1
 Fetal Heart Rate:  126                          bpm
 Cardiac Activity:  Observed
 Presentation:      Cephalic
 Placenta:          Posterior, above cervical
                    os
 P. Cord            Previously Visualized
 Insertion:

 Amniotic Fluid
 AFI FV:      Subjectively upper-normal
 AFI Sum:     21.99   cm       84  %Tile     Larg Pckt:    6.83  cm
 RUQ:   6.83    cm   RLQ:    3.21   cm    LUQ:   5.72    cm   LLQ:    6.23   cm
Biophysical Evaluation

 Amniotic F.V:   Within normal limits       F. Tone:        Observed
 F. Movement:    Observed                   Score:          [DATE]
 F. Breathing:   Observed
Gestational Age

 Best:          33w 2d     Det. By:  Early Ultrasound         EDD:   07/08/13
                                     (12/25/12)
Cervix Uterus Adnexa

 Cervix:       Not visualized (advanced GA >55wks)
Impression

 Single IUP at 33 [DATE] weeks
 Active fetus with BPP of [DATE]
 Normal amniotic fluid volume
Recommendations

 Continue 2x weekly NSTs with weekly AFIs.
 Follow up ultrasound for growth and to reevaluate the fetal
 kidneys in 3 weeks.

 questions or concerns.

## 2015-04-21 ENCOUNTER — Encounter (INDEPENDENT_AMBULATORY_CARE_PROVIDER_SITE_OTHER): Payer: Self-pay

## 2015-04-21 LAB — POCT PREGNANCY, URINE: Preg Test, Ur: POSITIVE — AB

## 2015-05-10 ENCOUNTER — Encounter: Payer: Self-pay | Admitting: General Practice

## 2015-05-10 ENCOUNTER — Telehealth: Payer: Self-pay | Admitting: General Practice

## 2015-05-10 DIAGNOSIS — B379 Candidiasis, unspecified: Secondary | ICD-10-CM

## 2015-05-10 MED ORDER — TERCONAZOLE 0.4 % VA CREA
1.0000 | TOPICAL_CREAM | Freq: Every day | VAGINAL | Status: DC
Start: 2015-05-10 — End: 2015-07-19

## 2015-05-10 NOTE — Telephone Encounter (Signed)
Patient called into front office stating she does not have a new OB appt till next month and is having thick white discharge, itching and burning with urination. Told patient we will send in a prescription to her pharmacy to treat that. Told patient it sounds like a yeast infection. Patient reports lmp  9/19. Informed patient of edd on 12/19/15. Patient verbalized understanding to all and had no questions

## 2015-06-22 ENCOUNTER — Encounter: Payer: Self-pay | Admitting: Certified Nurse Midwife

## 2015-07-05 ENCOUNTER — Encounter: Payer: Self-pay | Admitting: Advanced Practice Midwife

## 2015-07-07 ENCOUNTER — Telehealth: Payer: Self-pay | Admitting: *Deleted

## 2015-07-07 NOTE — Telephone Encounter (Addendum)
Pt left message stating that she has been having abdominal pain x several days which is "coming and going".  This pain kept her from sleeping and yesterday she also noticed some spotting. She wants to know what she can do for the pain and if she needs a sooner appt than 1/24 as scheduled.  *Per chart review, pt appt on 1/24 is for New Ob.  1/13  1210  Called pt and discussed her concern. She states she is continuing to have the pain and yesterday after work she just went home and went to bed. She also reports having itching of vagina and burning during intercourse as well as burning with urination. I advised pt that her abdominal pain may be from round ligament stretching. She should rest when possible to minimize this pain. The vaginal itching and burning may be from yeast. She may use OTC Monistat7 cream according to package directions. If she is still having burning with urination she should call the office on Monday and we would have her come in to submit urine specimen to check for UTI. If any symptoms worsen such as severe abdominal pain or heavy vaginal bleeding like a period, she should go to MAU for evaluation. She should keep New Ob appt as scheduled on 07/19/15.  Pt voiced understanding.

## 2015-07-19 ENCOUNTER — Encounter: Payer: Self-pay | Admitting: Advanced Practice Midwife

## 2015-07-19 ENCOUNTER — Encounter (HOSPITAL_COMMUNITY): Payer: Self-pay | Admitting: Student

## 2015-07-19 ENCOUNTER — Inpatient Hospital Stay (HOSPITAL_COMMUNITY): Payer: Medicaid Other

## 2015-07-19 ENCOUNTER — Inpatient Hospital Stay (HOSPITAL_COMMUNITY)
Admission: AD | Admit: 2015-07-19 | Discharge: 2015-07-19 | Disposition: A | Discharge status: Home Health | Payer: Medicaid Other | Source: Ambulatory Visit | Attending: Obstetrics and Gynecology | Admitting: Obstetrics and Gynecology

## 2015-07-19 DIAGNOSIS — Z3A18 18 weeks gestation of pregnancy: Secondary | ICD-10-CM | POA: Insufficient documentation

## 2015-07-19 DIAGNOSIS — O24112 Pre-existing diabetes mellitus, type 2, in pregnancy, second trimester: Secondary | ICD-10-CM | POA: Insufficient documentation

## 2015-07-19 DIAGNOSIS — N760 Acute vaginitis: Secondary | ICD-10-CM | POA: Diagnosis not present

## 2015-07-19 DIAGNOSIS — N898 Other specified noninflammatory disorders of vagina: Secondary | ICD-10-CM | POA: Diagnosis present

## 2015-07-19 DIAGNOSIS — B373 Candidiasis of vulva and vagina: Secondary | ICD-10-CM | POA: Diagnosis not present

## 2015-07-19 DIAGNOSIS — O26892 Other specified pregnancy related conditions, second trimester: Secondary | ICD-10-CM

## 2015-07-19 DIAGNOSIS — E1165 Type 2 diabetes mellitus with hyperglycemia: Secondary | ICD-10-CM | POA: Diagnosis not present

## 2015-07-19 DIAGNOSIS — Z87891 Personal history of nicotine dependence: Secondary | ICD-10-CM | POA: Diagnosis not present

## 2015-07-19 DIAGNOSIS — O99810 Abnormal glucose complicating pregnancy: Secondary | ICD-10-CM

## 2015-07-19 DIAGNOSIS — O98812 Other maternal infectious and parasitic diseases complicating pregnancy, second trimester: Secondary | ICD-10-CM | POA: Diagnosis not present

## 2015-07-19 DIAGNOSIS — O3432 Maternal care for cervical incompetence, second trimester: Secondary | ICD-10-CM

## 2015-07-19 LAB — COMPREHENSIVE METABOLIC PANEL
ALT: 15 U/L (ref 14–54)
ANION GAP: 9 (ref 5–15)
AST: 14 U/L — ABNORMAL LOW (ref 15–41)
Albumin: 3.2 g/dL — ABNORMAL LOW (ref 3.5–5.0)
Alkaline Phosphatase: 72 U/L (ref 38–126)
BILIRUBIN TOTAL: 0.3 mg/dL (ref 0.3–1.2)
BUN: 11 mg/dL (ref 6–20)
CHLORIDE: 104 mmol/L (ref 101–111)
CO2: 20 mmol/L — ABNORMAL LOW (ref 22–32)
Calcium: 8.4 mg/dL — ABNORMAL LOW (ref 8.9–10.3)
Creatinine, Ser: 0.45 mg/dL (ref 0.44–1.00)
Glucose, Bld: 193 mg/dL — ABNORMAL HIGH (ref 65–99)
POTASSIUM: 3.4 mmol/L — AB (ref 3.5–5.1)
Sodium: 133 mmol/L — ABNORMAL LOW (ref 135–145)
TOTAL PROTEIN: 7 g/dL (ref 6.5–8.1)

## 2015-07-19 LAB — CBC
HEMATOCRIT: 31.3 % — AB (ref 36.0–46.0)
Hemoglobin: 10.3 g/dL — ABNORMAL LOW (ref 12.0–15.0)
MCH: 24.9 pg — AB (ref 26.0–34.0)
MCHC: 32.9 g/dL (ref 30.0–36.0)
MCV: 75.8 fL — AB (ref 78.0–100.0)
PLATELETS: 245 10*3/uL (ref 150–400)
RBC: 4.13 MIL/uL (ref 3.87–5.11)
RDW: 14.1 % (ref 11.5–15.5)
WBC: 9 10*3/uL (ref 4.0–10.5)

## 2015-07-19 LAB — URINALYSIS, ROUTINE W REFLEX MICROSCOPIC
Bilirubin Urine: NEGATIVE
KETONES UR: NEGATIVE mg/dL
Nitrite: NEGATIVE
PH: 6.5 (ref 5.0–8.0)
Protein, ur: NEGATIVE mg/dL
Specific Gravity, Urine: 1.015 (ref 1.005–1.030)

## 2015-07-19 LAB — URINE MICROSCOPIC-ADD ON: RBC / HPF: NONE SEEN RBC/hpf (ref 0–5)

## 2015-07-19 LAB — WET PREP, GENITAL
CLUE CELLS WET PREP: NONE SEEN
Sperm: NONE SEEN
TRICH WET PREP: NONE SEEN
YEAST WET PREP: NONE SEEN

## 2015-07-19 LAB — GLUCOSE, CAPILLARY: GLUCOSE-CAPILLARY: 196 mg/dL — AB (ref 65–99)

## 2015-07-19 MED ORDER — TERCONAZOLE 0.8 % VA CREA: 1.0000 | TOPICAL_CREAM | Freq: Every day | VAGINAL | Status: DC

## 2015-07-19 NOTE — MAU Provider Note (Signed)
History     CSN: [id number]  Arrival date and time: 07/19/15 1420   First Provider Initiated Contact with Patient 07/19/15 1659       Chief Complaint  Patient presents with  . Vaginal Discharge  . Vaginal Itching   Vaginal Itching The patient's primary symptoms include genital itching, a genital odor and vaginal discharge. The patient's pertinent negatives include no genital lesions, pelvic pain or vaginal bleeding. Episode onset: x 2 weeks. The problem occurs constantly. The problem has been unchanged. She is pregnant. Associated symptoms include frequency. Pertinent negatives include no abdominal pain, constipation, diarrhea, dysuria, fever, hematuria, nausea, painful intercourse, urgency or vomiting. The vaginal discharge was malodorous, white and thick. There has been no bleeding. The symptoms are aggravated by urinating. She has tried antifungals for the symptoms. The treatment provided mild relief. She is sexually active. No, her partner does not have an STD. Her past medical history is significant for a Cesarean section and vaginosis. There is no history of an ectopic pregnancy, herpes simplex or an STD.    OB History    Gravida Para Term Preterm AB TAB SAB Ectopic Multiple Living   6 4 4  1 1    4       Past Medical History  Diagnosis Date  . Diabetes mellitus without complication (HCC)     GDM  . Gestational diabetes     G2 & G5, takes insulin  . Asthma     does not use inhaler    Past Surgical History  Procedure Laterality Date  . Cesarean section N/A 07/01/2013    Procedure: Primary Cesarean Section Delivery Baby Boy @ 2038, Apgars 7/9;  Surgeon: Jonnie Kind, MD;  Location: [facility];  Service: Obstetrics;  Laterality: N/A;    Family History  Problem Relation Age of Onset  . Diabetes Mother   . Hypertension Mother   . Arthritis Mother   . Hyperlipidemia Father     Social History  Substance Use Topics  . Smoking status: Former Smoker    Quit date:  05/02/2015  . Smokeless tobacco: Never Used  . Alcohol Use: No    Allergies: No Known Allergies  No prescriptions prior to admission    Review of Systems  Constitutional: Negative.  Negative for fever.  Gastrointestinal: Negative.  Negative for nausea, vomiting, abdominal pain, diarrhea and constipation.  Genitourinary: Positive for frequency and vaginal discharge. Negative for dysuria, urgency, hematuria and pelvic pain.       + vaginal discharge  + vaginal irritation No vaginal bleeding  Endo/Heme/Allergies: Negative for polydipsia.   Physical Exam   Blood pressure 114/56, pulse 76, temperature 98.5 F (36.9 C), temperature source Oral, resp. rate 18, weight 170 lb 9.6 oz (77.384 kg), last menstrual period 03/14/2015, SpO2 100 %, unknown if currently breastfeeding.  Physical Exam  Nursing note and vitals reviewed. Constitutional: She is oriented to person, place, and time. She appears well-developed and well-nourished. No distress.  HENT:  Head: Normocephalic and atraumatic.  Eyes: Conjunctivae are normal. Right eye exhibits no discharge. Left eye exhibits no discharge. No scleral icterus.  Neck: Normal range of motion.  Cardiovascular: Normal rate, regular rhythm and normal heart sounds.   No murmur heard. Respiratory: Effort normal and breath sounds normal. No respiratory distress. She has no wheezes.  GI: Soft.  Genitourinary: Cervix exhibits discharge (moderate amount of thin off-white discharge). Cervix exhibits no motion tenderness and no friability.  Bilateral labia appears erythematous & irritated Cervix 0.5/25/-3  Neurological: She is alert and oriented to person, place, and time.  Skin: Skin is warm and dry. She is not diaphoretic.  Psychiatric: She has a normal mood and affect. Her behavior is normal. Judgment and thought content normal.    MAU Course  Procedures Results for orders placed or performed during the hospital encounter of 07/19/15 (from the past  24 hour(s))  Urinalysis, Routine w reflex microscopic (not at [facility])     Status: Abnormal   Collection Time: 07/19/15  2:29 PM  Result Value Ref Range   Color, Urine YELLOW YELLOW   APPearance HAZY (A) CLEAR   Specific Gravity, Urine 1.015 1.005 - 1.030   pH 6.5 5.0 - 8.0   Glucose, UA >1000 (A) NEGATIVE mg/dL   Hgb urine dipstick TRACE (A) NEGATIVE   Bilirubin Urine NEGATIVE NEGATIVE   Ketones, ur NEGATIVE NEGATIVE mg/dL   Protein, ur NEGATIVE NEGATIVE mg/dL   Nitrite NEGATIVE NEGATIVE   Leukocytes, UA SMALL (A) NEGATIVE  Urine microscopic-add on     Status: Abnormal   Collection Time: 07/19/15  2:29 PM  Result Value Ref Range   Squamous Epithelial / LPF 6-30 (A) NONE SEEN   WBC, UA 6-30 0 - 5 WBC/hpf   RBC / HPF NONE SEEN 0 - 5 RBC/hpf   Bacteria, UA RARE (A) NONE SEEN  Glucose, capillary     Status: Abnormal   Collection Time: 07/19/15  4:38 PM  Result Value Ref Range   Glucose-Capillary 196 (H) 65 - 99 mg/dL  CBC     Status: Abnormal   Collection Time: 07/19/15  5:12 PM  Result Value Ref Range   WBC 9.0 4.0 - 10.5 K/uL   RBC 4.13 3.87 - 5.11 MIL/uL   Hemoglobin 10.3 (L) 12.0 - 15.0 g/dL   HCT 31.3 (L) 36.0 - 46.0 %   MCV 75.8 (L) 78.0 - 100.0 fL   MCH 24.9 (L) 26.0 - 34.0 pg   MCHC 32.9 30.0 - 36.0 g/dL   RDW 14.1 11.5 - 15.5 %   Platelets 245 150 - 400 K/uL  Comprehensive metabolic panel     Status: Abnormal   Collection Time: 07/19/15  5:12 PM  Result Value Ref Range   Sodium 133 (L) 135 - 145 mmol/L   Potassium 3.4 (L) 3.5 - 5.1 mmol/L   Chloride 104 101 - 111 mmol/L   CO2 20 (L) 22 - 32 mmol/L   Glucose, Bld 193 (H) 65 - 99 mg/dL   BUN 11 6 - 20 mg/dL   Creatinine, Ser 0.45 0.44 - 1.00 mg/dL   Calcium 8.4 (L) 8.9 - 10.3 mg/dL   Total Protein 7.0 6.5 - 8.1 g/dL   Albumin 3.2 (L) 3.5 - 5.0 g/dL   AST 14 (L) 15 - 41 U/L   ALT 15 14 - 54 U/L   Alkaline Phosphatase 72 38 - 126 U/L   Total Bilirubin 0.3 0.3 - 1.2 mg/dL   GFR calc non Af Amer >60 >60 mL/min    GFR calc Af Amer >60 >60 mL/min   Anion gap 9 5 - 15  Wet prep, genital     Status: Abnormal   Collection Time: 07/19/15  5:20 PM  Result Value Ref Range   Yeast Wet Prep HPF POC NONE SEEN NONE SEEN   Trich, Wet Prep NONE SEEN NONE SEEN   Clue Cells Wet Prep HPF POC NONE SEEN NONE SEEN   WBC, Wet Prep HPF POC MODERATE (A) NONE SEEN  Sperm NONE SEEN    MDM FHT 150 by doppler   Cervix externally 1 cm, internally 0.5. Sent to u/s for cervical length = 3.7 cm & normal per ultrasound. Pt denies abdominal pain; cervical exam likely d/t multiparity.   Pt has history of GDM x 2 pregnancies, treated with insulin. Per review of records, patient had serum glucose of 256 one year ago, & 323 in 01/2015. CBG obtained today d/t urine glucose >1000. Serum glucose today 193; pt states it's been 4 hours since last ate or drank. Based on history & labs, will send patient to diabetes educator on Monday.   Pt is a clinic patient. Scheduled for initial prenatal today but missed appt. Appt rescheduled for 2/9. Will order anatomy ultrasound.   Assessment and Plan  A: 1. Vaginal discharge during pregnancy in second trimester   2. Premature cervical dilation in second trimester   3. Vaginitis and vulvovaginitis   4. Hyperglycemia during pregnancy    P: Discharge home Message sent to clinic for patient to be seen by diabetes educator Discussed appropriate diet for diabetes Outpatient anatomy ultrasound ordered Keep scheduled prenatal appts Discussed reasons to return to MAU Rx terazol  Jorje Guild, NP  07/19/2015, 4:58 PM

## 2015-07-19 NOTE — MAU Note (Signed)
Pt presents to MAU with complaints of burning with urination and vaginal itching. Denies any vaginal bleeding

## 2015-07-19 NOTE — Discharge Instructions (Signed)
Vaginitis Vaginitis is an inflammation of the vagina. It is most often caused by a change in the normal balance of the bacteria and yeast that live in the vagina. This change in balance causes an overgrowth of certain bacteria or yeast, which causes the inflammation. There are different types of vaginitis, but the most common types are:  Bacterial vaginosis.  Yeast infection (candidiasis).  Trichomoniasis vaginitis. This is a sexually transmitted infection (STI).  Viral vaginitis.  Atrophic vaginitis.  Allergic vaginitis. CAUSES  The cause depends on the type of vaginitis. Vaginitis can be caused by:  Bacteria (bacterial vaginosis).  Yeast (yeast infection).  A parasite (trichomoniasis vaginitis)  A virus (viral vaginitis).  Low hormone levels (atrophic vaginitis). Low hormone levels can occur during pregnancy, breastfeeding, or after menopause.  Irritants, such as bubble baths, scented tampons, and feminine sprays (allergic vaginitis). Other factors can change the normal balance of the yeast and bacteria that live in the vagina. These include:  Antibiotic medicines.  Poor hygiene.  Diaphragms, vaginal sponges, spermicides, birth control pills, and intrauterine devices (IUD).  Sexual intercourse.  Infection.  Uncontrolled diabetes.  A weakened immune system. SYMPTOMS  Symptoms can vary depending on the cause of the vaginitis. Common symptoms include:  Abnormal vaginal discharge.  The discharge is white, gray, or yellow with bacterial vaginosis.  The discharge is thick, white, and cheesy with a yeast infection.  The discharge is frothy and yellow or greenish with trichomoniasis.  A bad vaginal odor.  The odor is fishy with bacterial vaginosis.  Vaginal itching, pain, or swelling.  Painful intercourse.  Pain or burning when urinating. Sometimes, there are no symptoms. TREATMENT  Treatment will vary depending on the type of infection.   Bacterial  vaginosis and trichomoniasis are often treated with antibiotic creams or pills.  Yeast infections are often treated with antifungal medicines, such as vaginal creams or suppositories.  Viral vaginitis has no cure, but symptoms can be treated with medicines that relieve discomfort. Your sexual partner should be treated as well.  Atrophic vaginitis may be treated with an estrogen cream, pill, suppository, or vaginal ring. If vaginal dryness occurs, lubricants and moisturizing creams may help. You may be told to avoid scented soaps, sprays, or douches.  Allergic vaginitis treatment involves quitting the use of the product that is causing the problem. Vaginal creams can be used to treat the symptoms. HOME CARE INSTRUCTIONS   Take all medicines as directed by your caregiver.  Keep your genital area clean and dry. Avoid soap and only rinse the area with water.  Avoid douching. It can remove the healthy bacteria in the vagina.  Do not use tampons or have sexual intercourse until your vaginitis has been treated. Use sanitary pads while you have vaginitis.  Wipe from front to back. This avoids the spread of bacteria from the rectum to the vagina.  Let air reach your genital area.  Wear cotton underwear to decrease moisture buildup.  Avoid wearing underwear while you sleep until your vaginitis is gone.  Avoid tight pants and underwear or nylons without a cotton panel.  Take off wet clothing (especially bathing suits) as soon as possible.  Use mild, non-scented products. Avoid using irritants, such as:  Scented feminine sprays.  Fabric softeners.  Scented detergents.  Scented tampons.  Scented soaps or bubble baths.  Practice safe sex and use condoms. Condoms may prevent the spread of trichomoniasis and viral vaginitis. SEEK MEDICAL CARE IF:   You have abdominal pain.  You  have a fever or persistent symptoms for more than 2-3 days.  You have a fever and your symptoms suddenly  get worse.   This information is not intended to replace advice given to you by your health care provider. Make sure you discuss any questions you have with your health care provider.   Document Released: 04/08/2007 Document Revised: 10/26/2014 Document Reviewed: 11/22/2011 Elsevier Interactive Patient Education 2016 [employer]. Diabetes Mellitus and Food It is important for you to manage your blood sugar (glucose) level. Your blood glucose level can be greatly affected by what you eat. Eating healthier foods in the appropriate amounts throughout the day at about the same time each day will help you control your blood glucose level. It can also help slow or prevent worsening of your diabetes mellitus. Healthy eating may even help you improve the level of your blood pressure and reach or maintain a healthy weight.  General recommendations for healthful eating and cooking habits include:  Eating meals and snacks regularly. Avoid going long periods of time without eating to lose weight.  Eating a diet that consists mainly of plant-based foods, such as fruits, vegetables, nuts, legumes, and whole grains.  Using low-heat cooking methods, such as baking, instead of high-heat cooking methods, such as deep frying. Work with your dietitian to make sure you understand how to use the Nutrition Facts information on food labels. HOW CAN FOOD AFFECT ME? Carbohydrates Carbohydrates affect your blood glucose level more than any other type of food. Your dietitian will help you determine how many carbohydrates to eat at each meal and teach you how to count carbohydrates. Counting carbohydrates is important to keep your blood glucose at a healthy level, especially if you are using insulin or taking certain medicines for diabetes mellitus. Alcohol Alcohol can cause sudden decreases in blood glucose (hypoglycemia), especially if you use insulin or take certain medicines for diabetes mellitus. Hypoglycemia can be a  life-threatening condition. Symptoms of hypoglycemia (sleepiness, dizziness, and disorientation) are similar to symptoms of having too much alcohol.  If your health care provider has given you approval to drink alcohol, do so in moderation and use the following guidelines:  Women should not have more than one drink per day, and men should not have more than two drinks per day. One drink is equal to:  12 oz of beer.  5 oz of wine.  1 oz of hard liquor.  Do not drink on an empty stomach.  Keep yourself hydrated. Have water, diet soda, or unsweetened iced tea.  Regular soda, juice, and other mixers might contain a lot of carbohydrates and should be counted. WHAT FOODS ARE NOT RECOMMENDED? As you make food choices, it is important to remember that all foods are not the same. Some foods have fewer nutrients per serving than other foods, even though they might have the same number of calories or carbohydrates. It is difficult to get your body what it needs when you eat foods with fewer nutrients. Examples of foods that you should avoid that are high in calories and carbohydrates but low in nutrients include:  Trans fats (most processed foods list trans fats on the Nutrition Facts label).  Regular soda.  Juice.  Candy.  Sweets, such as cake, pie, doughnuts, and cookies.  Fried foods. WHAT FOODS CAN I EAT? Eat nutrient-rich foods, which will nourish your body and keep you healthy. The food you should eat also will depend on several factors, including:  The calories you need.  The medicines you take.  Your weight.  Your blood glucose level.  Your blood pressure level.  Your cholesterol level. You should eat a variety of foods, including:  Protein.  Lean cuts of meat.  Proteins low in saturated fats, such as fish, egg whites, and beans. Avoid processed meats.  Fruits and vegetables.  Fruits and vegetables that may help control blood glucose levels, such as apples,  mangoes, and yams.  Dairy products.  Choose fat-free or low-fat dairy products, such as milk, yogurt, and cheese.  Grains, bread, pasta, and rice.  Choose whole grain products, such as multigrain bread, whole oats, and brown rice. These foods may help control blood pressure.  Fats.  Foods containing healthful fats, such as nuts, avocado, olive oil, canola oil, and fish. DOES EVERYONE WITH DIABETES MELLITUS HAVE THE SAME MEAL PLAN? Because every person with diabetes mellitus is different, there is not one meal plan that works for everyone. It is very important that you meet with a dietitian who will help you create a meal plan that is just right for you.   This information is not intended to replace advice given to you by your health care provider. Make sure you discuss any questions you have with your health care provider.   Document Released: 03/08/2005 Document Revised: 07/02/2014 Document Reviewed: 05/08/2013 Elsevier Interactive Patient Education [employer].

## 2015-07-19 NOTE — MAU Note (Signed)
Pt. States during her last pregnancy she had the same symptoms she is experiencing today. Pt. States she has for 3 weeks white discharge. Also, a foul smell, itching and burning in her vagina. Also, urine smells foul when she urinates. Denies bleeding. Next appointment is scheduled at the clinic August 04, 2015. Missed her appointment today.

## 2015-07-20 LAB — HIV ANTIBODY (ROUTINE TESTING W REFLEX): HIV SCREEN 4TH GENERATION: NONREACTIVE

## 2015-07-20 LAB — GC/CHLAMYDIA PROBE AMP (~~LOC~~) NOT AT ARMC
Chlamydia: NEGATIVE
Neisseria Gonorrhea: NEGATIVE

## 2015-07-22 LAB — CULTURE, OB URINE

## 2015-07-23 ENCOUNTER — Telehealth: Payer: Self-pay | Admitting: Obstetrics and Gynecology

## 2015-07-23 MED ORDER — AMOXICILLIN 500 MG PO CAPS: 500.0000 mg | ORAL_CAPSULE | Freq: Three times a day (TID) | ORAL | Status: DC

## 2015-07-23 NOTE — Telephone Encounter (Signed)
Pt returned phone call. Informed of urine culture results. Confirmed NKDA.  Rx sent to pharmacy. Questions answered.   Jorje Guild, NP

## 2015-07-25 ENCOUNTER — Ambulatory Visit: Payer: Medicaid Other | Admitting: *Deleted

## 2015-07-25 VITALS — Ht 60.0 in | Wt 168.0 lb

## 2015-07-25 DIAGNOSIS — O99810 Abnormal glucose complicating pregnancy: Secondary | ICD-10-CM

## 2015-07-25 MED ORDER — ACCU-CHEK FASTCLIX LANCETS MISC: 1.0000 | Freq: Four times a day (QID) | Status: DC

## 2015-07-25 MED ORDER — GLUCOSE BLOOD VI STRP
ORAL_STRIP | Status: DC
Start: 2015-07-25 — End: 2015-10-21

## 2015-07-25 MED ORDER — ACCU-CHEK NANO SMARTVIEW W/DEVICE KIT: 1.0000 | PACK | Freq: Four times a day (QID) | Status: DC

## 2015-07-25 NOTE — Progress Notes (Signed)
Nutrition note: DM diet education Pt has h/o obesity; pt had GDM with previous pregnancy and doctors are unsure if pt has Type 2 DM or has GDM again with this pregnancy. Pt has lost 15# @ 19w. Pt reports she had a lot of N&V but it has decreased. Pt reports eating 3 meals & 2 snacks/d. Pt did not remember much of the GDM diet she followed previously. Pt is not taking a PNV. Pt reports no heartburn but N&V occ. in the morning. Pt received verbal & written review of DM diet during pregnancy. Pt was unable to stay to see Izora Gala, DM educator but pt remembered how to check her BS. Pt knows to pick up supplies at her pharmacy- will need to see Izora Gala next week. Discussed tips to decrease N&V. Encouraged pt to start taking a PNV. Discussed wt gain goals of 11-20# or 0.5#/wk. Pt agrees to follow DM diet with 3 meals & 1-3 snacks/d with proper CHO/ protein combination. Pt does not have [place] but plans to apply. Pt plans to BF. F/u in 2-4 wks Vladimir Faster, MS, RD, LDN, [facility]

## 2015-08-04 ENCOUNTER — Ambulatory Visit (INDEPENDENT_AMBULATORY_CARE_PROVIDER_SITE_OTHER): Payer: Medicaid Other | Admitting: Family Medicine

## 2015-08-04 ENCOUNTER — Encounter: Payer: Self-pay | Admitting: Family Medicine

## 2015-08-04 ENCOUNTER — Other Ambulatory Visit (HOSPITAL_COMMUNITY)
Admission: RE | Admit: 2015-08-04 | Discharge: 2015-08-04 | Disposition: A | Discharge status: Home / Self Care | Payer: Medicaid Other | Source: Ambulatory Visit | Attending: Family Medicine | Admitting: Family Medicine

## 2015-08-04 VITALS — BP 99/47 | HR 76 | Temp 98.3°F | Wt 169.9 lb

## 2015-08-04 DIAGNOSIS — O3429 Maternal care due to uterine scar from other previous surgery: Secondary | ICD-10-CM | POA: Diagnosis not present

## 2015-08-04 DIAGNOSIS — A5901 Trichomonal vulvovaginitis: Secondary | ICD-10-CM

## 2015-08-04 DIAGNOSIS — O98812 Other maternal infectious and parasitic diseases complicating pregnancy, second trimester: Secondary | ICD-10-CM | POA: Diagnosis not present

## 2015-08-04 DIAGNOSIS — Z113 Encounter for screening for infections with a predominantly sexual mode of transmission: Secondary | ICD-10-CM | POA: Diagnosis present

## 2015-08-04 DIAGNOSIS — B373 Candidiasis of vulva and vagina: Secondary | ICD-10-CM | POA: Diagnosis not present

## 2015-08-04 DIAGNOSIS — Z01419 Encounter for gynecological examination (general) (routine) without abnormal findings: Secondary | ICD-10-CM | POA: Diagnosis present

## 2015-08-04 DIAGNOSIS — O09629 Supervision of young multigravida, unspecified trimester: Secondary | ICD-10-CM | POA: Insufficient documentation

## 2015-08-04 DIAGNOSIS — O2342 Unspecified infection of urinary tract in pregnancy, second trimester: Secondary | ICD-10-CM | POA: Diagnosis not present

## 2015-08-04 DIAGNOSIS — O23591 Infection of other part of genital tract in pregnancy, first trimester: Secondary | ICD-10-CM

## 2015-08-04 DIAGNOSIS — B951 Streptococcus, group B, as the cause of diseases classified elsewhere: Secondary | ICD-10-CM | POA: Insufficient documentation

## 2015-08-04 DIAGNOSIS — O24419 Gestational diabetes mellitus in pregnancy, unspecified control: Secondary | ICD-10-CM

## 2015-08-04 DIAGNOSIS — B3731 Acute candidiasis of vulva and vagina: Secondary | ICD-10-CM

## 2015-08-04 DIAGNOSIS — O09622 Supervision of young multigravida, second trimester: Secondary | ICD-10-CM

## 2015-08-04 DIAGNOSIS — O2340 Unspecified infection of urinary tract in pregnancy, unspecified trimester: Secondary | ICD-10-CM

## 2015-08-04 LAB — TSH: TSH: 1.07 mIU/L

## 2015-08-04 LAB — POCT URINALYSIS DIP (DEVICE)
BILIRUBIN URINE: NEGATIVE
Glucose, UA: 500 mg/dL — AB
Leukocytes, UA: NEGATIVE
NITRITE: NEGATIVE
PH: 6 (ref 5.0–8.0)
Protein, ur: 30 mg/dL — AB
Specific Gravity, Urine: 1.025 (ref 1.005–1.030)
UROBILINOGEN UA: 1 mg/dL (ref 0.0–1.0)

## 2015-08-04 LAB — COMPREHENSIVE METABOLIC PANEL
ALK PHOS: 68 U/L (ref 33–115)
ALT: 10 U/L (ref 6–29)
AST: 9 U/L — AB (ref 10–30)
Albumin: 3.5 g/dL — ABNORMAL LOW (ref 3.6–5.1)
BILIRUBIN TOTAL: 0.3 mg/dL (ref 0.2–1.2)
BUN: 12 mg/dL (ref 7–25)
CO2: 20 mmol/L (ref 20–31)
CREATININE: 0.47 mg/dL — AB (ref 0.50–1.10)
Calcium: 8.7 mg/dL (ref 8.6–10.2)
Chloride: 106 mmol/L (ref 98–110)
Glucose, Bld: 145 mg/dL — ABNORMAL HIGH (ref 65–99)
Potassium: 4 mmol/L (ref 3.5–5.3)
SODIUM: 135 mmol/L (ref 135–146)
Total Protein: 6.7 g/dL (ref 6.1–8.1)

## 2015-08-04 MED ORDER — FLUCONAZOLE 150 MG PO TABS: 150.0000 mg | ORAL_TABLET | Freq: Once | ORAL | Status: DC

## 2015-08-04 NOTE — Addendum Note (Signed)
Addended by: Riccardo Dubin on: 08/04/2015 04:39 PM   Modules accepted: Orders

## 2015-08-04 NOTE — Patient Instructions (Addendum)
Safe Medications in Pregnancy   Acne: Benzoyl Peroxide Salicylic Acid  Backache/Headache: Tylenol: 2 regular strength every 4 hours OR              2 Extra strength every 6 hours  Colds/Coughs/Allergies: Benadryl (alcohol free) 25 mg every 6 hours as needed Breath right strips Claritin Cepacol throat lozenges Chloraseptic throat spray Cold-Eeze- up to three times per day Cough drops, alcohol free Flonase (by prescription only) Guaifenesin Mucinex Robitussin DM (plain only, alcohol free) Saline nasal spray/drops Sudafed (pseudoephedrine) & Actifed ** use only after [redacted] weeks gestation and if you do not have high blood pressure Tylenol Vicks Vaporub Zinc lozenges Zyrtec   Constipation: Colace Ducolax suppositories Fleet enema Glycerin suppositories Metamucil Milk of magnesia Miralax Senokot Smooth move tea  Diarrhea: Kaopectate Imodium A-D  *NO pepto Bismol  Hemorrhoids: Anusol Anusol HC Preparation H Tucks  Indigestion: Tums Maalox Mylanta Zantac  Pepcid  Insomnia: Benadryl (alcohol free) 25mg  every 6 hours as needed Tylenol PM Unisom, no Gelcaps  Leg Cramps: Tums MagGel  Nausea/Vomiting:  Bonine Dramamine Emetrol Ginger extract Sea bands Meclizine  Nausea medication to take during pregnancy:  Unisom (doxylamine succinate 25 mg tablets) Take one tablet daily at bedtime. If symptoms are not adequately controlled, the dose can be increased to a maximum recommended dose of two tablets daily (1/2 tablet in the morning, 1/2 tablet mid-afternoon and one at bedtime). Vitamin B6 100mg  tablets. Take one tablet twice a day (up to 200 mg per day).  Skin Rashes: Aveeno products Benadryl cream or 25mg  every 6 hours as needed Calamine Lotion 1% cortisone cream  Yeast infection: Gyne-lotrimin 7 Monistat 7   **If taking multiple medications, please check labels to avoid duplicating the same active ingredients **take medication as directed on  the label ** Do not exceed 4000 mg of tylenol in 24 hours **Do not take medications that contain aspirin or ibuprofen    Third Trimester of Pregnancy The third trimester is from week 29 through week 42, months 7 through 9. The third trimester is a time when the fetus is growing rapidly. At the end of the ninth month, the fetus is about 20 inches in length and weighs 6-10 pounds.  BODY CHANGES Your body goes through many changes during pregnancy. The changes vary from woman to woman.   Your weight will continue to increase. You can expect to gain 25-35 pounds (11-16 kg) by the end of the pregnancy.  You may begin to get stretch marks on your hips, abdomen, and breasts.  You may urinate more often because the fetus is moving lower into your pelvis and pressing on your bladder.  You may develop or continue to have heartburn as a result of your pregnancy.  You may develop constipation because certain hormones are causing the muscles that push waste through your intestines to slow down.  You may develop hemorrhoids or swollen, bulging veins (varicose veins).  You may have pelvic pain because of the weight gain and pregnancy hormones relaxing your joints between the bones in your pelvis. Backaches may result from overexertion of the muscles supporting your posture.  You may have changes in your hair. These can include thickening of your hair, rapid growth, and changes in texture. Some women also have hair loss during or after pregnancy, or hair that feels dry or thin. Your hair will most likely return to normal after your baby is born.  Your breasts will continue to grow and be tender. A yellow discharge  may leak from your breasts called colostrum.  Your belly button may stick out.  You may feel short of breath because of your expanding uterus.  You may notice the fetus "dropping," or moving lower in your abdomen.  You may have a bloody mucus discharge. This usually occurs a few days to  a week before labor begins.  Your cervix becomes thin and soft (effaced) near your due date. WHAT TO EXPECT AT YOUR PRENATAL EXAMS  You will have prenatal exams every 2 weeks until week 36. Then, you will have weekly prenatal exams. During a routine prenatal visit:  You will be weighed to make sure you and the fetus are growing normally.  Your blood pressure is taken.  Your abdomen will be measured to track your baby's growth.  The fetal heartbeat will be listened to.  Any test results from the previous visit will be discussed.  You may have a cervical check near your due date to see if you have effaced. At around 36 weeks, your caregiver will check your cervix. At the same time, your caregiver will also perform a test on the secretions of the vaginal tissue. This test is to determine if a type of bacteria, Group B streptococcus, is present. Your caregiver will explain this further. Your caregiver may ask you:  What your birth plan is.  How you are feeling.  If you are feeling the baby move.  If you have had any abnormal symptoms, such as leaking fluid, bleeding, severe headaches, or abdominal cramping.  If you are using any tobacco products, including cigarettes, chewing tobacco, and electronic cigarettes.  If you have any questions. Other tests or screenings that may be performed during your third trimester include:  Blood tests that check for low iron levels (anemia).  Fetal testing to check the health, activity level, and growth of the fetus. Testing is done if you have certain medical conditions or if there are problems during the pregnancy.  HIV (human immunodeficiency virus) testing. If you are at high risk, you may be screened for HIV during your third trimester of pregnancy. FALSE LABOR You may feel small, irregular contractions that eventually go away. These are called Braxton Hicks contractions, or false labor. Contractions may last for hours, days, or even weeks  before true labor sets in. If contractions come at regular intervals, intensify, or become painful, it is best to be seen by your caregiver.  SIGNS OF LABOR   Menstrual-like cramps.  Contractions that are 5 minutes apart or less.  Contractions that start on the top of the uterus and spread down to the lower abdomen and back.  A sense of increased pelvic pressure or back pain.  A watery or bloody mucus discharge that comes from the vagina. If you have any of these signs before the 37th week of pregnancy, call your caregiver right away. You need to go to the hospital to get checked immediately. HOME CARE INSTRUCTIONS   Avoid all smoking, herbs, alcohol, and unprescribed drugs. These chemicals affect the formation and growth of the baby.  Do not use any tobacco products, including cigarettes, chewing tobacco, and electronic cigarettes. If you need help quitting, ask your health care provider. You may receive counseling support and other resources to help you quit.  Follow your caregiver's instructions regarding medicine use. There are medicines that are either safe or unsafe to take during pregnancy.  Exercise only as directed by your caregiver. Experiencing uterine cramps is a good sign to stop  exercising.  Continue to eat regular, healthy meals.  Wear a good support bra for breast tenderness.  Do not use hot tubs, steam rooms, or saunas.  Wear your seat belt at all times when driving.  Avoid raw meat, uncooked cheese, cat litter boxes, and soil used by cats. These carry germs that can cause birth defects in the baby.  Take your prenatal vitamins.  Take 1500-2000 mg of calcium daily starting at the 20th week of pregnancy until you deliver your baby.  Try taking a stool softener (if your caregiver approves) if you develop constipation. Eat more high-fiber foods, such as fresh vegetables or fruit and whole grains. Drink plenty of fluids to keep your urine clear or pale  yellow.  Take warm sitz baths to soothe any pain or discomfort caused by hemorrhoids. Use hemorrhoid cream if your caregiver approves.  If you develop varicose veins, wear support hose. Elevate your feet for 15 minutes, 3-4 times a day. Limit salt in your diet.  Avoid heavy lifting, wear low heal shoes, and practice good posture.  Rest a lot with your legs elevated if you have leg cramps or low back pain.  Visit your dentist if you have not gone during your pregnancy. Use a soft toothbrush to brush your teeth and be gentle when you floss.  A sexual relationship may be continued unless your caregiver directs you otherwise.  Do not travel far distances unless it is absolutely necessary and only with the approval of your caregiver.  Take prenatal classes to understand, practice, and ask questions about the labor and delivery.  Make a trial run to the hospital.  Pack your hospital bag.  Prepare the baby's nursery.  Continue to go to all your prenatal visits as directed by your caregiver. SEEK MEDICAL CARE IF:  You are unsure if you are in labor or if your water has broken.  You have dizziness.  You have mild pelvic cramps, pelvic pressure, or nagging pain in your abdominal area.  You have persistent nausea, vomiting, or diarrhea.  You have a bad smelling vaginal discharge.  You have pain with urination. SEEK IMMEDIATE MEDICAL CARE IF:   You have a fever.  You are leaking fluid from your vagina.  You have spotting or bleeding from your vagina.  You have severe abdominal cramping or pain.  You have rapid weight loss or gain.  You have shortness of breath with chest pain.  You notice sudden or extreme swelling of your face, hands, ankles, feet, or legs.  You have not felt your baby move in over an hour.  You have severe headaches that do not go away with medicine.  You have vision changes.   This information is not intended to replace advice given to you by your  health care provider. Make sure you discuss any questions you have with your health care provider.   Document Released: 06/05/2001 Document Revised: 07/02/2014 Document Reviewed: 08/12/2012 Elsevier Interactive Patient Education [employer].

## 2015-08-04 NOTE — Progress Notes (Signed)
Breastfeeding tip of the week reviewed Initial prenatal labs today Declines flu

## 2015-08-04 NOTE — Progress Notes (Signed)
U/S scheduled for 08/12/2015 @1 :15 AM Fetal echo scheduled for 09/05/2015 @ [facility](Dr Cotton).

## 2015-08-04 NOTE — Progress Notes (Signed)
Subjective:  Wanda Harris is a 35 y.o. [id number] at [redacted]w[redacted]d being seen today for ongoing prenatal care.  She is currently monitored for the following issues for this high-risk pregnancy and has Supervision of young multigravida, antepartum; GBS (group B streptococcus) UTI complicating pregnancy; Gestational diabetes mellitus (GDM) affecting pregnancy; and Uterine scar from previous surgery affecting pregnancy on her problem list.  Patient reports vaginal irritation.  Contractions: Not present. Vag. Bleeding: None.  Movement: Present. Denies leaking of fluid.   The following portions of the patient's history were reviewed and updated as appropriate: allergies, current medications, past family history, past medical history, past social history, past surgical history and problem list. Problem list updated.  Objective:   Filed Vitals:   08/04/15 0910  BP: 99/47  Pulse: 76  Temp: 98.3 F (36.8 C)  Weight: 169 lb 14.4 oz (77.066 kg)    Fetal Status: Fetal Heart Rate (bpm): 153   Movement: Present     General:  Alert, oriented and cooperative. Patient is in no acute distress.  Skin: Skin is warm and dry. No rash noted.   Cardiovascular: Normal heart rate noted  Respiratory: Normal respiratory effort, no problems with respiration noted  Abdomen: Soft, gravid, appropriate for gestational age. Pain/Pressure: Present     Pelvic: Vag. Bleeding: None Vag D/C Character: White   Cervical exam deferred        Extremities: Normal range of motion.  Edema: None  Mental Status: Normal mood and affect. Normal behavior. Normal judgment and thought content.   Urinalysis:      Assessment and Plan:  Pregnancy: [id number] at [redacted]w[redacted]d  1. Supervision of young multigravida, antepartum, second trimester - add box - Prenatal Profile - Hemoglobinopathy evaluation - Prescript Monitor Profile(19) - Culture, OB Urine - GC/Chlamydia probe amp ([place])not at [facility] - Cytology - PAP  2. GBS (group B streptococcus)  UTI complicating pregnancy, second trimester -needs PCN in labor, treated with amox - TOC today  3. Gestational diabetes mellitus (GDM) affecting pregnancy -Reports "good control" but did not bring in log. She reports fastings <95.  - Hemoglobin A1C - Comprehensive metabolic panel - Protein / creatinine ratio, urine - TSH  4. Candida vaginitis - Failed teraconazole, discussed risks of diflucan with patient - fluconazole (DIFLUCAN) 150 MG tablet; Take 1 tablet (150 mg total) by mouth once. Can take additional dose three days later if symptoms persist  Dispense: 1 tablet; Refill: 0  5. Uterine scar from previous surgery affecting pregnancy -Desires VBAC, signed consent  Preterm labor symptoms and general obstetric precautions including but not limited to vaginal bleeding, contractions, leaking of fluid and fetal movement were reviewed in detail with the patient. Please refer to After Visit Summary for other counseling recommendations.  Return in 2 weeks (on 08/18/2015) for Routine PNC and DM education.   Caren Macadam, MD

## 2015-08-05 LAB — PRENATAL PROFILE (SOLSTAS)
Antibody Screen: NEGATIVE
BASOS ABS: 0 10*3/uL (ref 0.0–0.1)
Basophils Relative: 0 % (ref 0–1)
Eosinophils Absolute: 0.1 10*3/uL (ref 0.0–0.7)
Eosinophils Relative: 1 % (ref 0–5)
HEMATOCRIT: 32.5 % — AB (ref 36.0–46.0)
HEP B S AG: NEGATIVE
HIV: NONREACTIVE
Hemoglobin: 10.8 g/dL — ABNORMAL LOW (ref 12.0–15.0)
LYMPHS ABS: 1.8 10*3/uL (ref 0.7–4.0)
LYMPHS PCT: 20 % (ref 12–46)
MCH: 25.2 pg — ABNORMAL LOW (ref 26.0–34.0)
MCHC: 33.2 g/dL (ref 30.0–36.0)
MCV: 75.9 fL — AB (ref 78.0–100.0)
MONO ABS: 0.4 10*3/uL (ref 0.1–1.0)
MPV: 9.4 fL (ref 8.6–12.4)
Monocytes Relative: 4 % (ref 3–12)
NEUTROS ABS: 6.9 10*3/uL (ref 1.7–7.7)
Neutrophils Relative %: 75 % (ref 43–77)
Platelets: 270 10*3/uL (ref 150–400)
RBC: 4.28 MIL/uL (ref 3.87–5.11)
RDW: 14.8 % (ref 11.5–15.5)
Rh Type: POSITIVE
Rubella: 2.75 Index — ABNORMAL HIGH (ref <0.90)
WBC: 9.2 10*3/uL (ref 4.0–10.5)

## 2015-08-05 LAB — CULTURE, OB URINE

## 2015-08-05 LAB — PRESCRIPTION MONITORING PROFILE (19 PANEL)
AMPHETAMINE/METH: NEGATIVE ng/mL
BARBITURATE SCREEN, URINE: NEGATIVE ng/mL
BENZODIAZEPINE SCREEN, URINE: NEGATIVE ng/mL
BUPRENORPHINE, URINE: NEGATIVE ng/mL
Cannabinoid Scrn, Ur: NEGATIVE ng/mL
Carisoprodol, Urine: NEGATIVE ng/mL
Cocaine Metabolites: NEGATIVE ng/mL
Creatinine, Urine: 148.09 mg/dL (ref >20.0)
FENTANYL URINE: NEGATIVE ng/mL
MDMA URINE: NEGATIVE ng/mL
METHAQUALONE SCREEN (URINE): NEGATIVE ng/mL
Meperidine, Ur: NEGATIVE ng/mL
Methadone Screen, Urine: NEGATIVE ng/mL
NITRITES URINE, INITIAL: NEGATIVE ug/mL
Opiate Screen, Urine: NEGATIVE ng/mL
Oxycodone Screen, Ur: NEGATIVE ng/mL
PROPOXYPHENE: NEGATIVE ng/mL
Phencyclidine, Ur: NEGATIVE ng/mL
TAPENTADOLUR: NEGATIVE ng/mL
TRAMADOL UR: NEGATIVE ng/mL
ZOLPIDEM, URINE: NEGATIVE ng/mL
pH, Initial: 6.1 pH (ref 4.5–8.9)

## 2015-08-05 LAB — GC/CHLAMYDIA PROBE AMP (~~LOC~~) NOT AT ARMC
Chlamydia: NEGATIVE
Neisseria Gonorrhea: NEGATIVE

## 2015-08-05 LAB — WET PREP, GENITAL: Clue Cells Wet Prep HPF POC: NONE SEEN

## 2015-08-05 LAB — HEMOGLOBIN A1C
HEMOGLOBIN A1C: 7.9 % — AB (ref <5.7)
MEAN PLASMA GLUCOSE: 180 mg/dL — AB (ref <117)

## 2015-08-05 LAB — CYTOLOGY - PAP

## 2015-08-05 LAB — PROTEIN / CREATININE RATIO, URINE
CREATININE, URINE: 154 mg/dL (ref 20–320)
PROTEIN CREATININE RATIO: 201 mg/g{creat} — AB (ref 21–161)
TOTAL PROTEIN, URINE: 31 mg/dL — AB (ref 5–24)

## 2015-08-06 ENCOUNTER — Other Ambulatory Visit: Payer: Self-pay | Admitting: Family Medicine

## 2015-08-06 ENCOUNTER — Encounter: Payer: Self-pay | Admitting: Family Medicine

## 2015-08-06 DIAGNOSIS — O23599 Infection of other part of genital tract in pregnancy, unspecified trimester: Secondary | ICD-10-CM

## 2015-08-06 DIAGNOSIS — A5901 Trichomonal vulvovaginitis: Secondary | ICD-10-CM | POA: Insufficient documentation

## 2015-08-06 DIAGNOSIS — O24419 Gestational diabetes mellitus in pregnancy, unspecified control: Secondary | ICD-10-CM

## 2015-08-06 MED ORDER — METRONIDAZOLE 500 MG PO TABS: ORAL_TABLET | ORAL | Status: DC

## 2015-08-06 MED ORDER — GLYBURIDE 2.5 MG PO TABS: 2.5000 mg | ORAL_TABLET | Freq: Two times a day (BID) | ORAL | Status: DC

## 2015-08-06 NOTE — Progress Notes (Signed)
Patient with Clas B GDM and poorly controlled diabetes with HA1C 7.9% with average BG ~180.   Sent in RX for glyburide.

## 2015-08-06 NOTE — Addendum Note (Signed)
Addended by: Caryl Bis on: 08/06/2015 04:53 AM   Modules accepted: Orders

## 2015-08-08 LAB — HEMOGLOBINOPATHY EVALUATION
Hemoglobin Other: 0 %
Hgb A2 Quant: 2.5 % (ref 2.2–3.2)
Hgb A: 96.6 % — ABNORMAL LOW (ref 96.8–97.8)
Hgb F Quant: 0.9 % (ref 0.0–2.0)
Hgb S Quant: 0 %

## 2015-08-09 ENCOUNTER — Telehealth: Payer: Self-pay | Admitting: *Deleted

## 2015-08-09 NOTE — Telephone Encounter (Signed)
Attempted to call patient using [place] interpreter [phone]. There was no answer. A voice mail was left stating that we are calling with important test results and medication changes. She should return our call at the clinic.

## 2015-08-09 NOTE — Telephone Encounter (Signed)
-----   Message from Caren Macadam, MD sent at 08/06/2015 10:37 AM EST ----- Call patient and let her know her diabetes test showed she needs to start medication to help lower her blood sugars appropriately. She can pick this medication up at the pharmacy. She should take 1 pill in the morning and 1 pill in the evening. This medication is called glyburide. She needs to check her sugars every morning and 2 hours after eating. This will help [place] know if the medication is working to control her sugar. High sugars can be dangerous to her pregnancy and can make she baby grow too big. Her best chance to have teh vaginal delivery she wants is to work on controlling her sugars now.   Thanks, Dr. Ernestina Patches

## 2015-08-12 ENCOUNTER — Other Ambulatory Visit (HOSPITAL_COMMUNITY): Payer: Self-pay | Admitting: Student

## 2015-08-12 ENCOUNTER — Ambulatory Visit (HOSPITAL_COMMUNITY)
Admission: RE | Admit: 2015-08-12 | Discharge: 2015-08-12 | Disposition: A | Discharge status: Home / Self Care | Payer: Medicaid Other | Source: Ambulatory Visit | Attending: Student | Admitting: Student

## 2015-08-12 ENCOUNTER — Encounter (HOSPITAL_COMMUNITY): Payer: Self-pay

## 2015-08-12 VITALS — BP 92/55 | HR 80

## 2015-08-12 DIAGNOSIS — O99810 Abnormal glucose complicating pregnancy: Secondary | ICD-10-CM

## 2015-08-12 DIAGNOSIS — Z36 Encounter for antenatal screening of mother: Secondary | ICD-10-CM | POA: Diagnosis not present

## 2015-08-12 DIAGNOSIS — N898 Other specified noninflammatory disorders of vagina: Secondary | ICD-10-CM

## 2015-08-12 DIAGNOSIS — N760 Acute vaginitis: Secondary | ICD-10-CM

## 2015-08-12 DIAGNOSIS — Z3A21 21 weeks gestation of pregnancy: Secondary | ICD-10-CM | POA: Insufficient documentation

## 2015-08-12 DIAGNOSIS — O09292 Supervision of pregnancy with other poor reproductive or obstetric history, second trimester: Secondary | ICD-10-CM | POA: Insufficient documentation

## 2015-08-12 DIAGNOSIS — O24415 Gestational diabetes mellitus in pregnancy, controlled by oral hypoglycemic drugs: Secondary | ICD-10-CM | POA: Diagnosis present

## 2015-08-12 DIAGNOSIS — O09622 Supervision of young multigravida, second trimester: Secondary | ICD-10-CM

## 2015-08-12 DIAGNOSIS — O26892 Other specified pregnancy related conditions, second trimester: Secondary | ICD-10-CM

## 2015-08-12 NOTE — Telephone Encounter (Signed)
Called patient using [place] interpreter [phone]. Patient speaks English and does not need an interpreter. Informed her of the test results and new medication. She was aware of a new prescription but had not yet picked it up. She voiced understanding of the importance of starting as soon as possible. I also told her to check her blood sugars first thing in the morning and 2 hours after each meal. Understanding was voiced.

## 2015-08-18 ENCOUNTER — Encounter (HOSPITAL_COMMUNITY): Payer: Self-pay

## 2015-08-18 ENCOUNTER — Inpatient Hospital Stay (HOSPITAL_COMMUNITY)
Admission: AD | Admit: 2015-08-18 | Discharge: 2015-08-18 | Disposition: A | Discharge status: Home / Self Care | Payer: Medicaid Other | Source: Ambulatory Visit | Attending: Obstetrics & Gynecology | Admitting: Obstetrics & Gynecology

## 2015-08-18 DIAGNOSIS — N949 Unspecified condition associated with female genital organs and menstrual cycle: Secondary | ICD-10-CM

## 2015-08-18 DIAGNOSIS — Z87891 Personal history of nicotine dependence: Secondary | ICD-10-CM | POA: Diagnosis not present

## 2015-08-18 DIAGNOSIS — B373 Candidiasis of vulva and vagina: Secondary | ICD-10-CM | POA: Diagnosis not present

## 2015-08-18 DIAGNOSIS — O9989 Other specified diseases and conditions complicating pregnancy, childbirth and the puerperium: Secondary | ICD-10-CM

## 2015-08-18 DIAGNOSIS — Z3A22 22 weeks gestation of pregnancy: Secondary | ICD-10-CM | POA: Diagnosis not present

## 2015-08-18 DIAGNOSIS — O98812 Other maternal infectious and parasitic diseases complicating pregnancy, second trimester: Secondary | ICD-10-CM | POA: Diagnosis not present

## 2015-08-18 DIAGNOSIS — B3731 Acute candidiasis of vulva and vagina: Secondary | ICD-10-CM

## 2015-08-18 DIAGNOSIS — R109 Unspecified abdominal pain: Secondary | ICD-10-CM | POA: Diagnosis present

## 2015-08-18 LAB — URINE MICROSCOPIC-ADD ON

## 2015-08-18 LAB — URINALYSIS, ROUTINE W REFLEX MICROSCOPIC
BILIRUBIN URINE: NEGATIVE
HGB URINE DIPSTICK: NEGATIVE
Ketones, ur: 15 mg/dL — AB
Leukocytes, UA: NEGATIVE
Nitrite: NEGATIVE
PH: 5.5 (ref 5.0–8.0)
Protein, ur: NEGATIVE mg/dL
SPECIFIC GRAVITY, URINE: 1.015 (ref 1.005–1.030)

## 2015-08-18 LAB — WET PREP, GENITAL
Sperm: NONE SEEN
TRICH WET PREP: NONE SEEN
YEAST WET PREP: NONE SEEN

## 2015-08-18 LAB — OB RESULTS CONSOLE GC/CHLAMYDIA: Gonorrhea: NEGATIVE

## 2015-08-18 MED ORDER — ACETAMINOPHEN 500 MG PO TABS
1000.0000 mg | ORAL_TABLET | Freq: Once | ORAL | Status: AC
  Administered 2015-08-18: 1000 mg via ORAL
  Filled 2015-08-18: qty 2

## 2015-08-18 MED ORDER — CYCLOBENZAPRINE HCL 10 MG PO TABS
10.0000 mg | ORAL_TABLET | Freq: Once | ORAL | Status: AC
  Administered 2015-08-18: 10 mg via ORAL
  Filled 2015-08-18: qty 1

## 2015-08-18 MED ORDER — TERCONAZOLE 0.4 % VA CREA: 1.0000 | TOPICAL_CREAM | Freq: Every day | VAGINAL | Status: DC

## 2015-08-18 NOTE — MAU Note (Signed)
Been feeling contractions since she was at work.  No hx of PTL.

## 2015-08-18 NOTE — MAU Provider Note (Signed)
History     CSN: [id number]  Arrival date and time: 08/18/15 1842   First Provider Initiated Contact with Patient 08/18/15 2002      Chief Complaint  Patient presents with  . Contractions   HPI Comments: Wanda Harris is a 34 y.o. G6P4014 at [redacted]w[redacted]d who presents today with abdominal pain. She states that she has the the pain all day today. She feels like it is contractions. She denies any VB or LOF. She has not started to feel the baby move with this pregnancy. Patient states that her blood glucose checks have been b/w 104-120   Abdominal Pain This is a new problem. The current episode started today. The onset quality is gradual. The problem occurs intermittently (feels like they are contractions, and are frequent. About every 5-10 mins ). The pain is located in the generalized abdominal region. The pain is at a severity of 8/10. The quality of the pain is cramping. The abdominal pain does not radiate. Pertinent negatives include no constipation, diarrhea, dysuria, fever, frequency, nausea or vomiting. Nothing aggravates the pain. The pain is relieved by nothing. She has tried nothing for the symptoms.     Past Medical History  Diagnosis Date  . Diabetes mellitus without complication (HCC)     GDM  . Gestational diabetes     G2 & G5, takes insulin  . Asthma     does not use inhaler    Past Surgical History  Procedure Laterality Date  . Cesarean section N/A 07/01/2013    Procedure: Primary Cesarean Section Delivery Baby Boy @ 2038, Apgars 7/9;  Surgeon: John V Ferguson, MD;  Location: [facility];  Service: Obstetrics;  Laterality: N/A;    Family History  Problem Relation Age of Onset  . Diabetes Mother   . Hypertension Mother   . Arthritis Mother   . Hyperlipidemia Father   . Heart disease Father   . Stroke Father     Social History  Substance Use Topics  . Smoking status: Former Smoker    Quit date: 05/02/2015  . Smokeless tobacco: Never Used  . Alcohol Use: No     Allergies: No Known Allergies  Prescriptions prior to admission  Medication Sig Dispense Refill Last Dose  . ACCU-CHEK FASTCLIX LANCETS MISC 1 each by Percutaneous route 4 (four) times daily. 100 each 12 Taking  . amoxicillin (AMOXIL) 500 MG capsule Take 1 capsule (500 mg total) by mouth 3 (three) times daily. (Patient not taking: Reported on 08/12/2015) 21 capsule 0 Not Taking  . Blood Glucose Monitoring Suppl (ACCU-CHEK NANO SMARTVIEW) w/Device KIT 1 each by Does not apply route 4 (four) times daily. 1 kit 0 Taking  . fluconazole (DIFLUCAN) 150 MG tablet Take 1 tablet (150 mg total) by mouth once. Can take additional dose three days later if symptoms persist (Patient not taking: Reported on 08/12/2015) 1 tablet 0 Not Taking  . glucose blood test strip Use as instructed 50 each 12 Taking  . glyBURIDE (DIABETA) 2.5 MG tablet Take 1 tablet (2.5 mg total) by mouth 2 (two) times daily with a meal. (Patient not taking: Reported on 08/12/2015) 60 tablet 8 Not Taking  . metroNIDAZOLE (FLAGYL) 500 MG tablet Take two tablets by mouth twice a day, for one day.  Or you can take all four tablets at once if you can tolerate it. (Patient not taking: Reported on 08/12/2015) 4 tablet 0 Not Taking  . terconazole (TERAZOL 3) 0.8 % vaginal cream Place 1 applicator vaginally   at bedtime. (Patient not taking: Reported on 08/04/2015) 20 g 0 Not Taking    Review of Systems  Constitutional: Negative for fever and chills.  Gastrointestinal: Positive for abdominal pain. Negative for nausea, vomiting, diarrhea and constipation.  Genitourinary: Negative for dysuria, urgency and frequency.   Physical Exam   Blood pressure 111/57, pulse 74, temperature 98.3 F (36.8 C), temperature source Oral, resp. rate 18, weight 78.109 kg (172 lb 3.2 oz), last menstrual period 03/14/2015, unknown if currently breastfeeding.  Physical Exam  Nursing note and vitals reviewed. Constitutional: She is oriented to person, place, and time.  She appears well-developed and well-nourished. No distress.  HENT:  Head: Normocephalic.  Cardiovascular: Normal rate.   Respiratory: Effort normal.  GI: There is no tenderness. There is no rebound.  Genitourinary:  Cervix: closed/thick/high   Neurological: She is alert and oriented to person, place, and time.  Skin: Skin is warm and dry.  Psychiatric: She has a normal mood and affect.   Results for orders placed or performed during the hospital encounter of 08/18/15 (from the past 24 hour(s))  Urinalysis, Routine w reflex microscopic (not at [facility])     Status: Abnormal   Collection Time: 08/18/15  7:08 PM  Result Value Ref Range   Color, Urine YELLOW YELLOW   APPearance CLEAR CLEAR   Specific Gravity, Urine 1.015 1.005 - 1.030   pH 5.5 5.0 - 8.0   Glucose, UA >1000 (A) NEGATIVE mg/dL   Hgb urine dipstick NEGATIVE NEGATIVE   Bilirubin Urine NEGATIVE NEGATIVE   Ketones, ur 15 (A) NEGATIVE mg/dL   Protein, ur NEGATIVE NEGATIVE mg/dL   Nitrite NEGATIVE NEGATIVE   Leukocytes, UA NEGATIVE NEGATIVE  Urine microscopic-add on     Status: Abnormal   Collection Time: 08/18/15  7:08 PM  Result Value Ref Range   Squamous Epithelial / LPF 0-5 (A) NONE SEEN   WBC, UA 0-5 0 - 5 WBC/hpf   RBC / HPF 0-5 0 - 5 RBC/hpf   Bacteria, UA RARE (A) NONE SEEN   Urine-Other YEAST   Wet prep, genital     Status: Abnormal   Collection Time: 08/18/15  8:10 PM  Result Value Ref Range   Yeast Wet Prep HPF POC NONE SEEN NONE SEEN   Trich, Wet Prep NONE SEEN NONE SEEN   Clue Cells Wet Prep HPF POC PRESENT (A) NONE SEEN   WBC, Wet Prep HPF POC MANY (A) NONE SEEN   Sperm NONE SEEN     MAU Course  Procedures  MDM   Assessment and Plan   1. Round ligament pain   2. [redacted] weeks gestation of pregnancy   3. Yeast infection involving the vagina and surrounding area    DC home Comfort measures reviewed  2nd Trimester precautions  PTL precautions  Fetal kick counts RX: terazol 7  Return to MAU as  needed FU with OB as planned  Follow-up Information    Follow up with [facility].   Specialty:  Obstetrics and Gynecology   Why:  As scheduled   Contact information:   [place] [phone]        Mathis Bud 08/18/2015, 8:04 PM

## 2015-08-18 NOTE — Discharge Instructions (Signed)

## 2015-08-19 LAB — GC/CHLAMYDIA PROBE AMP (~~LOC~~) NOT AT ARMC
Chlamydia: NEGATIVE
NEISSERIA GONORRHEA: NEGATIVE

## 2015-08-22 ENCOUNTER — Encounter: Payer: Medicaid Other | Admitting: Obstetrics and Gynecology

## 2015-08-29 ENCOUNTER — Ambulatory Visit (INDEPENDENT_AMBULATORY_CARE_PROVIDER_SITE_OTHER): Payer: Medicaid Other | Admitting: Family Medicine

## 2015-08-29 ENCOUNTER — Encounter: Payer: Self-pay | Admitting: Family Medicine

## 2015-08-29 VITALS — BP 98/52 | HR 71 | Temp 98.3°F | Wt 172.8 lb

## 2015-08-29 DIAGNOSIS — O24419 Gestational diabetes mellitus in pregnancy, unspecified control: Secondary | ICD-10-CM

## 2015-08-29 DIAGNOSIS — O3429 Maternal care due to uterine scar from other previous surgery: Secondary | ICD-10-CM | POA: Diagnosis not present

## 2015-08-29 DIAGNOSIS — O2342 Unspecified infection of urinary tract in pregnancy, second trimester: Secondary | ICD-10-CM | POA: Diagnosis not present

## 2015-08-29 DIAGNOSIS — O09622 Supervision of young multigravida, second trimester: Secondary | ICD-10-CM

## 2015-08-29 LAB — POCT URINALYSIS DIP (DEVICE)
Bilirubin Urine: NEGATIVE
GLUCOSE, UA: 500 mg/dL — AB
Hgb urine dipstick: NEGATIVE
NITRITE: NEGATIVE
PROTEIN: NEGATIVE mg/dL
Specific Gravity, Urine: 1.015 (ref 1.005–1.030)
UROBILINOGEN UA: 0.2 mg/dL (ref 0.0–1.0)
pH: 5.5 (ref 5.0–8.0)

## 2015-08-29 MED ORDER — GLYBURIDE 2.5 MG PO TABS: 2.5000 mg | ORAL_TABLET | Freq: Two times a day (BID) | ORAL | Status: DC

## 2015-08-29 NOTE — Patient Instructions (Addendum)
Following an appropriate diet and keeping your blood sugar under control is the most important thing to do for your health and that of your unborn baby.  Please check your blood sugar 4 times daily.  Please keep accurate BS logs and bring them with you to every visit.  Please bring your meter also.  Goals for Blood sugar should be: 1. Fasting (first thing in the morning before eating) should be less than 90.   2.  2 hours after meals should be less than 120.  Please eat 3 meals and 3 snacks.  Include protein (meat, dairy-cheese, eggs, nuts) with all meals.  Be mindful that carbohydrates increase your blood sugar.  Not just sweet food (cookies, cake, donuts, fruit, juice, soda) but also bread, pasta, rice, and potatoes.  You have to limit how many carbs you are eating.  Adding exercise, as little as 30 minutes a day can decrease your blood sugar.  Gestational Diabetes Mellitus Gestational diabetes mellitus, often simply referred to as gestational diabetes, is a type of diabetes that some women develop during pregnancy. In gestational diabetes, the pancreas does not make enough insulin (a hormone), the cells are less responsive to the insulin that is made (insulin resistance), or both.Normally, insulin moves sugars from food into the tissue cells. The tissue cells use the sugars for energy. The lack of insulin or the lack of normal response to insulin causes excess sugars to build up in the blood instead of going into the tissue cells. As a result, high blood sugar (hyperglycemia) develops. The effect of high sugar (glucose) levels can cause many problems.  RISK FACTORS You have an increased chance of developing gestational diabetes if you have a family history of diabetes and also have one or more of the following risk factors:  A body mass index over 30 (obesity).  A previous pregnancy with gestational diabetes.  An older age at the time of pregnancy. If blood glucose levels are kept in  the normal range during pregnancy, women can have a healthy pregnancy. If your blood glucose levels are not well controlled, there may be risks to you, your unborn baby (fetus), your labor and delivery, or your newborn baby.  SYMPTOMS  If symptoms are experienced, they are much like symptoms you would normally expect during pregnancy. The symptoms of gestational diabetes include:   Increased thirst (polydipsia).  Increased urination (polyuria).  Increased urination during the night (nocturia).  Weight loss. This weight loss may be rapid.  Frequent, recurring infections.  Tiredness (fatigue).  Weakness.  Vision changes, such as blurred vision.  Fruity smell to your breath.  Abdominal pain. DIAGNOSIS Diabetes is diagnosed when blood glucose levels are increased. Your blood glucose level may be checked by one or more of the following blood tests:  A fasting blood glucose test. You will not be allowed to eat for at least 8 hours before a blood sample is taken.  A random blood glucose test. Your blood glucose is checked at any time of the day regardless of when you ate.  An oral glucose tolerance test (OGTT). Your blood glucose is measured after you have not eaten (fasted) for 1-3 hours and then after you drink a glucose-containing beverage. Since the hormones that cause insulin resistance are highest at about 24-28 weeks of a pregnancy, an OGTT is usually performed during that time. If you have risk factors, you may be screened for undiagnosed type 2 diabetes at your first prenatal visit. TREATMENT  Gestational diabetes   should be managed first with diet and exercise. Medicines may be added only if they are needed.  You will need to take diabetes medicine or insulin daily to keep blood glucose levels in the desired range.  You will need to match insulin dosing with exercise and healthy food choices. If you have gestational diabetes, your treatment goal is to maintain the following  blood glucose levels:  Before meals (preprandial): at or below 95 mg/dL.  After meals (postprandial):  One hour after a meal: at or below 140 mg/dL.  Two hours after a meal: at or below 120 mg/dL. If you have pre-existing type 1 or type 2 diabetes, your treatment goal is to maintain the following blood glucose levels:  Before meals, at bedtime, and overnight: 60-99 mg/dL.  After meals: peak of 100-129 mg/dL. HOME CARE INSTRUCTIONS   Have your hemoglobin A1c level checked twice a year.  Perform daily blood glucose monitoring as directed by your health care provider. It is common to perform frequent blood glucose monitoring.  Monitor urine ketones when you are ill and as directed by your health care provider.  Take your diabetes medicine and insulin as directed by your health care provider to maintain your blood glucose level in the desired range.  Never run out of diabetes medicine or insulin. It is needed every day.  Adjust insulin based on your intake of carbohydrates. Carbohydrates can raise blood glucose levels but need to be included in your diet. Carbohydrates provide vitamins, minerals, and fiber which are an essential part of a healthy diet. Carbohydrates are found in fruits, vegetables, whole grains, dairy products, legumes, and foods containing added sugars.  Eat healthy foods. Alternate 3 meals with 3 snacks.  Maintain a healthy weight gain. The usual total expected weight gain varies according to your prepregnancy body mass index (BMI).  Carry a medical alert card or wear your medical alert jewelry.  Carry a 15-gram carbohydrate snack with you at all times to treat low blood glucose (hypoglycemia). Some examples of 15-gram carbohydrate snacks include:  Glucose tablets, 3 or 4.  Glucose gel, 15-gram tube.  Raisins, 2 tablespoons (24 g).  Jelly beans, 6.  Animal crackers, 8.  Fruit juice, regular soda, or low-fat milk, 4 ounces (120 mL).  Gummy treats,  9.  Recognize hypoglycemia. Hypoglycemia during pregnancy occurs with blood glucose levels of 60 mg/dL and below. The risk for hypoglycemia increases when fasting or skipping meals, during or after intense exercise, and during sleep. Hypoglycemia symptoms can include:  Tremors or shakes.  Decreased ability to concentrate.  Sweating.  Increased heart rate.  Headache.  Dry mouth.  Hunger.  Irritability.  Anxiety.  Restless sleep.  Altered speech or coordination.  Confusion.  Treat hypoglycemia promptly. If you are alert and able to safely swallow, follow the 15:15 rule:  Take 15-20 grams of rapid-acting glucose or carbohydrate. Rapid-acting options include glucose gel, glucose tablets, or 4 ounces (120 mL) of fruit juice, regular soda, or low-fat milk.  Check your blood glucose level 15 minutes after taking the glucose.  Take 15-20 grams more of glucose if the repeat blood glucose level is still 70 mg/dL or below.  Eat a meal or snack within 1 hour once blood glucose levels return to normal.  Be alert to polyuria (excess urination) and polydipsia (excess thirst) which are early signs of hyperglycemia. An early awareness of hyperglycemia allows for prompt treatment. Treat hyperglycemia as directed by your health care provider.  Engage in at least   30 minutes of physical activity a day or as directed by your health care provider. Ten minutes of physical activity timed 30 minutes after each meal is encouraged to control postprandial blood glucose levels.  Adjust your insulin dosing and food intake as needed if you start a new exercise or sport.  Follow your sick-day plan at any time you are unable to eat or drink as usual.  Avoid tobacco and alcohol use.  Keep all follow-up visits as directed by your health care provider.  Follow the advice of your health care provider regarding your prenatal and post-delivery (postpartum) appointments, meal planning, exercise, medicines,  vitamins, blood tests, other medical tests, and physical activities.  Perform daily skin and foot care. Examine your skin and feet daily for cuts, bruises, redness, nail problems, bleeding, blisters, or sores.  Brush your teeth and gums at least twice a day and floss at least once a day. Follow up with your dentist regularly.  Schedule an eye exam during the first trimester of your pregnancy or as directed by your health care provider.  Share your diabetes management plan with your workplace or school.  Stay up-to-date with immunizations.  Learn to manage stress.  Obtain ongoing diabetes education and support as needed.  Learn about and consider breastfeeding your baby.  You should have your blood sugar level checked 6-12 weeks after delivery. This is done with an oral glucose tolerance test (OGTT). SEEK MEDICAL CARE IF:   You are unable to eat food or drink fluids for more than 6 hours.  You have nausea and vomiting for more than 6 hours.  You have a blood glucose level of 200 mg/dL and you have ketones in your urine.  There is a change in mental status.  You develop vision problems.  You have a persistent headache.  You have upper abdominal pain or discomfort.  You develop an additional serious illness.  You have diarrhea for more than 6 hours.  You have been sick or have had a fever for a couple of days and are not getting better. SEEK IMMEDIATE MEDICAL CARE IF:   You have difficulty breathing.  You no longer feel the baby moving.  You are bleeding or have discharge from your vagina.  You start having premature contractions or labor. MAKE SURE YOU:  Understand these instructions.  Will watch your condition.  Will get help right away if you are not doing well or get worse.   This information is not intended to replace advice given to you by your health care provider. Make sure you discuss any questions you have with your health care provider.   Document  Released: 09/17/2000 Document Revised: 07/02/2014 Document Reviewed: 01/08/2012 Elsevier Interactive Patient Education 2016 [employer]  Breastfeeding Deciding to breastfeed is one of the best choices you can make for you and your baby. A change in hormones during pregnancy causes your breast tissue to grow and increases the number and size of your milk ducts. These hormones also allow proteins, sugars, and fats from your blood supply to make breast milk in your milk-producing glands. Hormones prevent breast milk from being released before your baby is born as well as prompt milk flow after birth. Once breastfeeding has begun, thoughts of your baby, as well as his or her sucking or crying, can stimulate the release of milk from your milk-producing glands.  BENEFITS OF BREASTFEEDING For Your Baby  Your first milk (colostrum) helps your baby's digestive system function better.  There are   antibodies in your milk that help your baby fight off infections.  Your baby has a lower incidence of asthma, allergies, and sudden infant death syndrome.  The nutrients in breast milk are better for your baby than infant formulas and are designed uniquely for your baby's needs.  Breast milk improves your baby's brain development.  Your baby is less likely to develop other conditions, such as childhood obesity, asthma, or type 2 diabetes mellitus. For You  Breastfeeding helps to create a very special bond between you and your baby.  Breastfeeding is convenient. Breast milk is always available at the correct temperature and costs nothing.  Breastfeeding helps to burn calories and helps you lose the weight gained during pregnancy.  Breastfeeding makes your uterus contract to its prepregnancy size faster and slows bleeding (lochia) after you give birth.   Breastfeeding helps to lower your risk of developing type 2 diabetes mellitus, osteoporosis, and breast or ovarian cancer later in life. SIGNS THAT  YOUR BABY IS HUNGRY Early Signs of Hunger  Increased alertness or activity.  Stretching.  Movement of the head from side to side.  Movement of the head and opening of the mouth when the corner of the mouth or cheek is stroked (rooting).  Increased sucking sounds, smacking lips, cooing, sighing, or squeaking.  Hand-to-mouth movements.  Increased sucking of fingers or hands. Late Signs of Hunger  Fussing.  Intermittent crying. Extreme Signs of Hunger Signs of extreme hunger will require calming and consoling before your baby will be able to breastfeed successfully. Do not wait for the following signs of extreme hunger to occur before you initiate breastfeeding:  Restlessness.  A loud, strong cry.  Screaming. BREASTFEEDING BASICS Breastfeeding Initiation  Find a comfortable place to sit or lie down, with your neck and back well supported.  Place a pillow or rolled up blanket under your baby to bring him or her to the level of your breast (if you are seated). Nursing pillows are specially designed to help support your arms and your baby while you breastfeed.  Make sure that your baby's abdomen is facing your abdomen.  Gently massage your breast. With your fingertips, massage from your chest wall toward your nipple in a circular motion. This encourages milk flow. You may need to continue this action during the feeding if your milk flows slowly.  Support your breast with 4 fingers underneath and your thumb above your nipple. Make sure your fingers are well away from your nipple and your baby's mouth.  Stroke your baby's lips gently with your finger or nipple.  When your baby's mouth is open wide enough, quickly bring your baby to your breast, placing your entire nipple and as much of the colored area around your nipple (areola) as possible into your baby's mouth.  More areola should be visible above your baby's upper lip than below the lower lip.  Your baby's tongue should  be between his or her lower gum and your breast.  Ensure that your baby's mouth is correctly positioned around your nipple (latched). Your baby's lips should create a seal on your breast and be turned out (everted).  It is common for your baby to suck about 2-3 minutes in order to start the flow of breast milk. Latching Teaching your baby how to latch on to your breast properly is very important. An improper latch can cause nipple pain and decreased milk supply for you and poor weight gain in your baby. Also, if your baby is not   latched onto your nipple properly, he or she may swallow some air during feeding. This can make your baby fussy. Burping your baby when you switch breasts during the feeding can help to get rid of the air. However, teaching your baby to latch on properly is still the best way to prevent fussiness from swallowing air while breastfeeding. Signs that your baby has successfully latched on to your nipple:  Silent tugging or silent sucking, without causing you pain.  Swallowing heard between every 3-4 sucks.  Muscle movement above and in front of his or her ears while sucking. Signs that your baby has not successfully latched on to nipple:  Sucking sounds or smacking sounds from your baby while breastfeeding.  Nipple pain. If you think your baby has not latched on correctly, slip your finger into the corner of your baby's mouth to break the suction and place it between your baby's gums. Attempt breastfeeding initiation again. Signs of Successful Breastfeeding Signs from your baby:  A gradual decrease in the number of sucks or complete cessation of sucking.  Falling asleep.  Relaxation of his or her body.  Retention of a small amount of milk in his or her mouth.  Letting go of your breast by himself or herself. Signs from you:  Breasts that have increased in firmness, weight, and size 1-3 hours after feeding.  Breasts that are softer immediately after  breastfeeding.  Increased milk volume, as well as a change in milk consistency and color by the fifth day of breastfeeding.  Nipples that are not sore, cracked, or bleeding. Signs That Your Baby is Getting Enough Milk  Wetting at least 3 diapers in a 24-hour period. The urine should be clear and pale yellow by age 5 days.  At least 3 stools in a 24-hour period by age 5 days. The stool should be soft and yellow.  At least 3 stools in a 24-hour period by age 7 days. The stool should be seedy and yellow.  No loss of weight greater than 10% of birth weight during the first 3 days of age.  Average weight gain of 4-7 ounces (113-198 g) per week after age 4 days.  Consistent daily weight gain by age 5 days, without weight loss after the age of 2 weeks. After a feeding, your baby may spit up a small amount. This is common. BREASTFEEDING FREQUENCY AND DURATION Frequent feeding will help you make more milk and can prevent sore nipples and breast engorgement. Breastfeed when you feel the need to reduce the fullness of your breasts or when your baby shows signs of hunger. This is called "breastfeeding on demand." Avoid introducing a pacifier to your baby while you are working to establish breastfeeding (the first 4-6 weeks after your baby is born). After this time you may choose to use a pacifier. Research has shown that pacifier use during the first year of a baby's life decreases the risk of sudden infant death syndrome (SIDS). Allow your baby to feed on each breast as long as he or she wants. Breastfeed until your baby is finished feeding. When your baby unlatches or falls asleep while feeding from the first breast, offer the second breast. Because newborns are often sleepy in the first few weeks of life, you may need to awaken your baby to get him or her to feed. Breastfeeding times will vary from baby to baby. However, the following rules can serve as a guide to help you ensure that your baby is    properly fed:  Newborns (babies 4 weeks of age or younger) may breastfeed every 1-3 hours.  Newborns should not go longer than 3 hours during the day or 5 hours during the night without breastfeeding.  You should breastfeed your baby a minimum of 8 times in a 24-hour period until you begin to introduce solid foods to your baby at around 6 months of age. BREAST MILK PUMPING Pumping and storing breast milk allows you to ensure that your baby is exclusively fed your breast milk, even at times when you are unable to breastfeed. This is especially important if you are going back to work while you are still breastfeeding or when you are not able to be present during feedings. Your lactation consultant can give you guidelines on how long it is safe to store breast milk. A breast pump is a machine that allows you to pump milk from your breast into a sterile bottle. The pumped breast milk can then be stored in a refrigerator or freezer. Some breast pumps are operated by hand, while others use electricity. Ask your lactation consultant which type will work best for you. Breast pumps can be purchased, but some hospitals and breastfeeding support groups lease breast pumps on a monthly basis. A lactation consultant can teach you how to hand express breast milk, if you prefer not to use a pump. CARING FOR YOUR BREASTS WHILE YOU BREASTFEED Nipples can become dry, cracked, and sore while breastfeeding. The following recommendations can help keep your breasts moisturized and healthy:  Avoid using soap on your nipples.  Wear a supportive bra. Although not required, special nursing bras and tank tops are designed to allow access to your breasts for breastfeeding without taking off your entire bra or top. Avoid wearing underwire-style bras or extremely tight bras.  Air dry your nipples for 3-4minutes after each feeding.  Use only cotton bra pads to absorb leaked breast milk. Leaking of breast milk between feedings  is normal.  Use lanolin on your nipples after breastfeeding. Lanolin helps to maintain your skin's normal moisture barrier. If you use pure lanolin, you do not need to wash it off before feeding your baby again. Pure lanolin is not toxic to your baby. You may also hand express a few drops of breast milk and gently massage that milk into your nipples and allow the milk to air dry. In the first few weeks after giving birth, some women experience extremely full breasts (engorgement). Engorgement can make your breasts feel heavy, warm, and tender to the touch. Engorgement peaks within 3-5 days after you give birth. The following recommendations can help ease engorgement:  Completely empty your breasts while breastfeeding or pumping. You may want to start by applying warm, moist heat (in the shower or with warm water-soaked hand towels) just before feeding or pumping. This increases circulation and helps the milk flow. If your baby does not completely empty your breasts while breastfeeding, pump any extra milk after he or she is finished.  Wear a snug bra (nursing or regular) or tank top for 1-2 days to signal your body to slightly decrease milk production.  Apply ice packs to your breasts, unless this is too uncomfortable for you.  Make sure that your baby is latched on and positioned properly while breastfeeding. If engorgement persists after 48 hours of following these recommendations, contact your health care provider or a lactation consultant. OVERALL HEALTH CARE RECOMMENDATIONS WHILE BREASTFEEDING  Eat healthy foods. Alternate between meals and snacks, eating 3   of each per day. Because what you eat affects your breast milk, some of the foods may make your baby more irritable than usual. Avoid eating these foods if you are sure that they are negatively affecting your baby.  Drink milk, fruit juice, and water to satisfy your thirst (about 10 glasses a day).  Rest often, relax, and continue to take  your prenatal vitamins to prevent fatigue, stress, and anemia.  Continue breast self-awareness checks.  Avoid chewing and smoking tobacco. Chemicals from cigarettes that pass into breast milk and exposure to secondhand smoke may harm your baby.  Avoid alcohol and drug use, including marijuana. Some medicines that may be harmful to your baby can pass through breast milk. It is important to ask your health care provider before taking any medicine, including all over-the-counter and prescription medicine as well as vitamin and herbal supplements. It is possible to become pregnant while breastfeeding. If birth control is desired, ask your health care provider about options that will be safe for your baby. SEEK MEDICAL CARE IF:  You feel like you want to stop breastfeeding or have become frustrated with breastfeeding.  You have painful breasts or nipples.  Your nipples are cracked or bleeding.  Your breasts are red, tender, or warm.  You have a swollen area on either breast.  You have a fever or chills.  You have nausea or vomiting.  You have drainage other than breast milk from your nipples.  Your breasts do not become full before feedings by the fifth day after you give birth.  You feel sad and depressed.  Your baby is too sleepy to eat well.  Your baby is having trouble sleeping.   Your baby is wetting less than 3 diapers in a 24-hour period.  Your baby has less than 3 stools in a 24-hour period.  Your baby's skin or the white part of his or her eyes becomes yellow.   Your baby is not gaining weight by 5 days of age. SEEK IMMEDIATE MEDICAL CARE IF:  Your baby is overly tired (lethargic) and does not want to wake up and feed.  Your baby develops an unexplained fever.   This information is not intended to replace advice given to you by your health care provider. Make sure you discuss any questions you have with your health care provider.   Document Released: 06/11/2005  Document Revised: 03/02/2015 Document Reviewed: 12/03/2012 [employer] Interactive Patient Education 2016 [employer]  

## 2015-08-29 NOTE — Progress Notes (Signed)
Declined Flu injection at this time

## 2015-08-29 NOTE — Progress Notes (Signed)
Subjective:  Wanda Harris is a 35 y.o. [id number] at [redacted]w[redacted]d being seen today for ongoing prenatal care.  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  Patient reports no complaints.  Contractions: Not present. Vag. Bleeding: None.  Movement: Present. Denies leaking of fluid.   The following portions of the patient's history were reviewed and updated as appropriate: allergies, current medications, past family history, past medical history, past social history, past surgical history and problem list. Problem list updated.  Objective:   Filed Vitals:   08/29/15 1000  BP: 98/52  Pulse: 71  Temp: 98.3 F (36.8 C)  Weight: 172 lb 12.8 oz (78.382 kg)    Fetal Status: Fetal Heart Rate (bpm): 154 Fundal Height: 25 cm Movement: Present     General:  Alert, oriented and cooperative. Patient is in no acute distress.  Skin: Skin is warm and dry. No rash noted.   Cardiovascular: Normal heart rate noted  Respiratory: Normal respiratory effort, no problems with respiration noted  Abdomen: Soft, gravid, appropriate for gestational age. Pain/Pressure: Present     Pelvic: Vag. Bleeding: None     Cervical exam deferred        Extremities: Normal range of motion.  Edema: None  Mental Status: Normal mood and affect. Normal behavior. Normal judgment and thought content.   Urinalysis: Urine Protein: Negative Urine Glucose: 3+ No book--reports [place] in the 100-110 range and 2 hour pp 120-130 Assessment and Plan:  Pregnancy: [id number] at [redacted]w[redacted]d  1. Supervision of young multigravida, antepartum, second trimester Continue prenatal care. Needs f/u u/s scheduled at next visit  2. Gestational  diabetes mellitus (GDM) affecting pregnancy Increase Glyburide based on reports--reminded her to bring her book next time. - glyBURIDE (DIABETA) 2.5 MG tablet; Take 1-2 tablets (2.5-5 mg total) by mouth 2 (two) times daily with a meal. 1 q am and 2 q hs  Dispense: 60 tablet; Refill: 8  3. 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 Please refer to After Visit Summary for other counseling recommendations.  Return in 2 weeks (on 09/12/2015).   Donnamae Jude, MD

## 2015-09-09 ENCOUNTER — Encounter: Payer: Self-pay | Admitting: *Deleted

## 2015-09-12 ENCOUNTER — Encounter: Payer: Medicaid Other | Admitting: Obstetrics and Gynecology

## 2015-09-19 ENCOUNTER — Ambulatory Visit (INDEPENDENT_AMBULATORY_CARE_PROVIDER_SITE_OTHER): Payer: Medicaid Other | Admitting: Obstetrics & Gynecology

## 2015-09-19 VITALS — BP 100/44 | HR 76 | Temp 98.1°F | Wt 175.3 lb

## 2015-09-19 DIAGNOSIS — O09623 Supervision of young multigravida, third trimester: Secondary | ICD-10-CM

## 2015-09-19 DIAGNOSIS — O24419 Gestational diabetes mellitus in pregnancy, unspecified control: Secondary | ICD-10-CM

## 2015-09-19 DIAGNOSIS — Z23 Encounter for immunization: Secondary | ICD-10-CM

## 2015-09-19 DIAGNOSIS — B373 Candidiasis of vulva and vagina: Secondary | ICD-10-CM

## 2015-09-19 DIAGNOSIS — O98812 Other maternal infectious and parasitic diseases complicating pregnancy, second trimester: Secondary | ICD-10-CM

## 2015-09-19 LAB — POCT URINALYSIS DIP (DEVICE)
BILIRUBIN URINE: NEGATIVE
Glucose, UA: >=1000 mg/dL — AB
Leukocytes, UA: NEGATIVE
NITRITE: NEGATIVE
PH: 5.5 (ref 5.0–8.0)
PROTEIN: 30 mg/dL — AB
Specific Gravity, Urine: >=1.03 (ref 1.005–1.030)
Urobilinogen, UA: 0.2 mg/dL (ref 0.0–1.0)

## 2015-09-19 LAB — CBC
HCT: 30.6 % — ABNORMAL LOW (ref 36.0–46.0)
HEMOGLOBIN: 9.8 g/dL — AB (ref 12.0–15.0)
MCH: 23.9 pg — ABNORMAL LOW (ref 26.0–34.0)
MCHC: 32 g/dL (ref 30.0–36.0)
MCV: 74.6 fL — ABNORMAL LOW (ref 78.0–100.0)
MPV: 9.2 fL (ref 8.6–12.4)
Platelets: 265 10*3/uL (ref 150–400)
RBC: 4.1 MIL/uL (ref 3.87–5.11)
RDW: 14.4 % (ref 11.5–15.5)
WBC: 9.6 10*3/uL (ref 4.0–10.5)

## 2015-09-19 LAB — HIV ANTIBODY (ROUTINE TESTING W REFLEX): HIV: NONREACTIVE

## 2015-09-19 MED ORDER — TETANUS-DIPHTH-ACELL PERTUSSIS 5-2.5-18.5 LF-MCG/0.5 IM SUSP
0.5000 mL | Freq: Once | INTRAMUSCULAR | Status: AC
  Administered 2015-09-19: 0.5 mL via INTRAMUSCULAR

## 2015-09-19 MED ORDER — FLUCONAZOLE 150 MG PO TABS: 150.0000 mg | ORAL_TABLET | Freq: Once | ORAL | Status: DC

## 2015-09-19 NOTE — Patient Instructions (Signed)

## 2015-09-19 NOTE — Progress Notes (Signed)
Breasfeeding tip of the week reviewed  C/o vaginal discharge that itches and smells 28 week labs today

## 2015-09-19 NOTE — Progress Notes (Signed)
Appt scheduled for tomorrow 03/28 @ 1045am.

## 2015-09-19 NOTE — Progress Notes (Signed)
Discharge and vaginal itch. Forgot her log today  Subjective:  Wanda Harris is a 35 y.o. [id number] at [redacted]w[redacted]d being seen today for ongoing prenatal care.  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  Patient reports vaginal irritation.  Contractions: Not present. Vag. Bleeding: None.  Movement: Present. Denies leaking of fluid.   The following portions of the patient's history were reviewed and updated as appropriate: allergies, current medications, past family history, past medical history, past social history, past surgical history and problem list. Problem list updated.  Objective:   Filed Vitals:   09/19/15 0913  BP: 100/44  Pulse: 76  Temp: 98.1 F (36.7 C)  Weight: 79.516 kg (175 lb 4.8 oz)    Fetal Status: Fetal Heart Rate (bpm): 160   Movement: Present     General:  Alert, oriented and cooperative. Patient is in no acute distress.  Skin: Skin is warm and dry. No rash noted.   Cardiovascular: Normal heart rate noted  Respiratory: Normal respiratory effort, no problems with respiration noted  Abdomen: Soft, gravid, appropriate for gestational age. Pain/Pressure: Present     Pelvic: Vag. Bleeding: None Vag D/C Character: White   Cervical exam deferred        Extremities: Normal range of motion.     Mental Status: Normal mood and affect. Normal behavior. Normal judgment and thought content.   Urinalysis: Urine Protein: Negative Urine Glucose: 3+  Assessment and Plan:  Pregnancy: [id number] at [redacted]w[redacted]d  1. Supervision of young multigravida, antepartum, third trimester Suspect yeast vaginitis by exam today, wet prep done - CBC - HIV antibody (with reflex) - RPR -  Tdap (BOOSTRIX) injection 0.5 mL; Inject 0.5 mLs into the muscle once. - Wet prep, genital  2. Gestational diabetes mellitus (GDM) affecting pregnancy States BG better on increased dose of medication but did not bring her book  Preterm labor symptoms and general obstetric precautions including but not limited to vaginal bleeding, contractions, leaking of fluid and fetal movement were reviewed in detail with the patient. Please refer to After Visit Summary for other counseling recommendations.  Return in about 1 week (around 09/26/2015).   Woodroe Mode, MD

## 2015-09-20 LAB — WET PREP, GENITAL: Trich, Wet Prep: NONE SEEN

## 2015-09-20 LAB — RPR

## 2015-09-23 ENCOUNTER — Ambulatory Visit (HOSPITAL_COMMUNITY): Payer: Medicaid Other

## 2015-09-26 ENCOUNTER — Encounter: Payer: Medicaid Other | Admitting: Obstetrics & Gynecology

## 2015-09-30 ENCOUNTER — Other Ambulatory Visit (HOSPITAL_COMMUNITY): Payer: Self-pay | Admitting: Maternal and Fetal Medicine

## 2015-09-30 ENCOUNTER — Encounter (HOSPITAL_COMMUNITY): Payer: Self-pay

## 2015-09-30 ENCOUNTER — Ambulatory Visit (HOSPITAL_COMMUNITY)
Admission: RE | Admit: 2015-09-30 | Discharge: 2015-09-30 | Disposition: A | Discharge status: Home / Self Care | Payer: Medicaid Other | Source: Ambulatory Visit | Attending: Maternal and Fetal Medicine | Admitting: Maternal and Fetal Medicine

## 2015-09-30 DIAGNOSIS — Z36 Encounter for antenatal screening of mother: Secondary | ICD-10-CM | POA: Insufficient documentation

## 2015-09-30 DIAGNOSIS — Z3A28 28 weeks gestation of pregnancy: Secondary | ICD-10-CM | POA: Insufficient documentation

## 2015-09-30 DIAGNOSIS — O09292 Supervision of pregnancy with other poor reproductive or obstetric history, second trimester: Secondary | ICD-10-CM | POA: Diagnosis not present

## 2015-09-30 DIAGNOSIS — O24415 Gestational diabetes mellitus in pregnancy, controlled by oral hypoglycemic drugs: Secondary | ICD-10-CM

## 2015-09-30 DIAGNOSIS — O34219 Maternal care for unspecified type scar from previous cesarean delivery: Secondary | ICD-10-CM | POA: Insufficient documentation

## 2015-10-21 ENCOUNTER — Encounter (HOSPITAL_COMMUNITY): Payer: Self-pay | Admitting: *Deleted

## 2015-10-21 ENCOUNTER — Inpatient Hospital Stay (HOSPITAL_COMMUNITY)
Admission: AD | Admit: 2015-10-21 | Discharge: 2015-10-23 | DRG: 781 | Disposition: A | Discharge status: Home / Self Care | Payer: Medicaid Other | Source: Ambulatory Visit | Attending: Obstetrics and Gynecology | Admitting: Obstetrics and Gynecology

## 2015-10-21 DIAGNOSIS — O24415 Gestational diabetes mellitus in pregnancy, controlled by oral hypoglycemic drugs: Secondary | ICD-10-CM

## 2015-10-21 DIAGNOSIS — Z8261 Family history of arthritis: Secondary | ICD-10-CM | POA: Diagnosis not present

## 2015-10-21 DIAGNOSIS — Z8249 Family history of ischemic heart disease and other diseases of the circulatory system: Secondary | ICD-10-CM

## 2015-10-21 DIAGNOSIS — O4693 Antepartum hemorrhage, unspecified, third trimester: Secondary | ICD-10-CM | POA: Diagnosis present

## 2015-10-21 DIAGNOSIS — J45909 Unspecified asthma, uncomplicated: Secondary | ICD-10-CM | POA: Diagnosis present

## 2015-10-21 DIAGNOSIS — O2303 Infections of kidney in pregnancy, third trimester: Secondary | ICD-10-CM | POA: Diagnosis present

## 2015-10-21 DIAGNOSIS — Z823 Family history of stroke: Secondary | ICD-10-CM | POA: Diagnosis not present

## 2015-10-21 DIAGNOSIS — O24414 Gestational diabetes mellitus in pregnancy, insulin controlled: Secondary | ICD-10-CM | POA: Diagnosis not present

## 2015-10-21 DIAGNOSIS — Z87891 Personal history of nicotine dependence: Secondary | ICD-10-CM | POA: Diagnosis not present

## 2015-10-21 DIAGNOSIS — O99820 Streptococcus B carrier state complicating pregnancy: Secondary | ICD-10-CM | POA: Diagnosis not present

## 2015-10-21 DIAGNOSIS — N12 Tubulo-interstitial nephritis, not specified as acute or chronic: Secondary | ICD-10-CM | POA: Diagnosis not present

## 2015-10-21 DIAGNOSIS — Z833 Family history of diabetes mellitus: Secondary | ICD-10-CM | POA: Diagnosis not present

## 2015-10-21 DIAGNOSIS — O99513 Diseases of the respiratory system complicating pregnancy, third trimester: Secondary | ICD-10-CM | POA: Diagnosis not present

## 2015-10-21 DIAGNOSIS — Z3A31 31 weeks gestation of pregnancy: Secondary | ICD-10-CM

## 2015-10-21 DIAGNOSIS — O4703 False labor before 37 completed weeks of gestation, third trimester: Secondary | ICD-10-CM

## 2015-10-21 LAB — CBC WITH DIFFERENTIAL/PLATELET
BASOS PCT: 0 %
Basophils Absolute: 0 10*3/uL (ref 0.0–0.1)
Eosinophils Absolute: 0.1 10*3/uL (ref 0.0–0.7)
Eosinophils Relative: 1 %
HEMATOCRIT: 32.3 % — AB (ref 36.0–46.0)
HEMOGLOBIN: 10.3 g/dL — AB (ref 12.0–15.0)
Lymphocytes Relative: 29 %
Lymphs Abs: 2.8 10*3/uL (ref 0.7–4.0)
MCH: 22.8 pg — ABNORMAL LOW (ref 26.0–34.0)
MCHC: 31.9 g/dL (ref 30.0–36.0)
MCV: 71.6 fL — ABNORMAL LOW (ref 78.0–100.0)
MONOS PCT: 4 %
Monocytes Absolute: 0.4 10*3/uL (ref 0.1–1.0)
NEUTROS ABS: 6.4 10*3/uL (ref 1.7–7.7)
NEUTROS PCT: 66 %
Platelets: 276 10*3/uL (ref 150–400)
RBC: 4.51 MIL/uL (ref 3.87–5.11)
RDW: 14.3 % (ref 11.5–15.5)
WBC: 10.1 10*3/uL (ref 4.0–10.5)

## 2015-10-21 LAB — COMPREHENSIVE METABOLIC PANEL
ALT: 11 U/L — ABNORMAL LOW (ref 14–54)
ANION GAP: 8 (ref 5–15)
AST: 12 U/L — ABNORMAL LOW (ref 15–41)
Albumin: 2.8 g/dL — ABNORMAL LOW (ref 3.5–5.0)
Alkaline Phosphatase: 136 U/L — ABNORMAL HIGH (ref 38–126)
BUN: 10 mg/dL (ref 6–20)
CALCIUM: 9 mg/dL (ref 8.9–10.3)
CHLORIDE: 109 mmol/L (ref 101–111)
CO2: 19 mmol/L — AB (ref 22–32)
Creatinine, Ser: 0.53 mg/dL (ref 0.44–1.00)
GFR calc non Af Amer: >60 mL/min (ref >60)
Glucose, Bld: 146 mg/dL — ABNORMAL HIGH (ref 65–99)
Potassium: 3.8 mmol/L (ref 3.5–5.1)
SODIUM: 136 mmol/L (ref 135–145)
Total Bilirubin: 0.4 mg/dL (ref 0.3–1.2)
Total Protein: 6.7 g/dL (ref 6.5–8.1)

## 2015-10-21 LAB — URINALYSIS, ROUTINE W REFLEX MICROSCOPIC
Bilirubin Urine: NEGATIVE
Glucose, UA: 500 mg/dL — AB
Ketones, ur: 15 mg/dL — AB
LEUKOCYTES UA: NEGATIVE
NITRITE: POSITIVE — AB
PH: 6 (ref 5.0–8.0)
Protein, ur: NEGATIVE mg/dL
SPECIFIC GRAVITY, URINE: 1.025 (ref 1.005–1.030)

## 2015-10-21 LAB — GLUCOSE, CAPILLARY
Glucose-Capillary: 143 mg/dL — ABNORMAL HIGH (ref 65–99)
Glucose-Capillary: 126 mg/dL — ABNORMAL HIGH (ref 65–99)

## 2015-10-21 LAB — WET PREP, GENITAL
CLUE CELLS WET PREP: NONE SEEN
SPERM: NONE SEEN
TRICH WET PREP: NONE SEEN
Yeast Wet Prep HPF POC: NONE SEEN

## 2015-10-21 LAB — TYPE AND SCREEN
ABO/RH(D): O POS
ANTIBODY SCREEN: NEGATIVE

## 2015-10-21 LAB — URINE MICROSCOPIC-ADD ON

## 2015-10-21 MED ORDER — PRENATAL MULTIVITAMIN CH
1.0000 | ORAL_TABLET | Freq: Every day | ORAL | Status: DC
  Administered 2015-10-22 – 2015-10-23 (×2): 1 via ORAL
  Filled 2015-10-21 (×2): qty 1

## 2015-10-21 MED ORDER — DEXTROSE 5 % IV SOLN
2.0000 g | Freq: Once | INTRAVENOUS | Status: AC
  Administered 2015-10-21: 2 g via INTRAVENOUS
  Filled 2015-10-21: qty 2

## 2015-10-21 MED ORDER — SODIUM CHLORIDE 0.9 % IV SOLN
INTRAVENOUS | Status: DC
  Administered 2015-10-21: 20:00:00 via INTRAVENOUS

## 2015-10-21 MED ORDER — ACETAMINOPHEN 325 MG PO TABS
650.0000 mg | ORAL_TABLET | ORAL | Status: DC | PRN
  Administered 2015-10-22 – 2015-10-23 (×2): 650 mg via ORAL
  Filled 2015-10-21 (×2): qty 2

## 2015-10-21 MED ORDER — DOCUSATE SODIUM 100 MG PO CAPS
100.0000 mg | ORAL_CAPSULE | Freq: Every day | ORAL | Status: DC
  Administered 2015-10-22 – 2015-10-23 (×2): 100 mg via ORAL
  Filled 2015-10-21 (×2): qty 1

## 2015-10-21 MED ORDER — ZOLPIDEM TARTRATE 5 MG PO TABS: 5.0000 mg | ORAL_TABLET | Freq: Every evening | ORAL | Status: DC | PRN

## 2015-10-21 MED ORDER — DEXTROSE 5 % IV SOLN
2.0000 g | INTRAVENOUS | Status: DC
  Administered 2015-10-22: 2 g via INTRAVENOUS
  Filled 2015-10-21 (×2): qty 2

## 2015-10-21 MED ORDER — SODIUM CHLORIDE 0.9 % IV SOLN
INTRAVENOUS | Status: DC
  Administered 2015-10-21 – 2015-10-23 (×6): via INTRAVENOUS

## 2015-10-21 MED ORDER — NIFEDIPINE 10 MG PO CAPS
10.0000 mg | ORAL_CAPSULE | Freq: Once | ORAL | Status: AC
  Administered 2015-10-21: 10 mg via ORAL
  Filled 2015-10-21: qty 1

## 2015-10-21 MED ORDER — CALCIUM CARBONATE ANTACID 500 MG PO CHEW: 2.0000 | CHEWABLE_TABLET | ORAL | Status: DC | PRN

## 2015-10-21 NOTE — MAU Note (Signed)
Pt states that when she went to use the bathroom both this morning and this afternoon the pt saw blood in the toilet.  Pt states she is having lower abdominal pain that goes around to her back.

## 2015-10-21 NOTE — MAU Provider Note (Signed)
Chief Complaint:  Abdominal Pain and Urinary Tract Infection   First Provider Initiated Contact with Patient 10/21/15 1919     HPI  Wanda Harris is a 35 y.o. [name] at 87w4dho presents to maternity admissions reporting seeing blood in toilet twice today. Also c/o tightening and cramping, but states did not know they were contractions.  Did not have blood after BM, pretty sure it is vaginal or urinary. She reports good fetal movement, denies LOF, vaginal itching/burning, urinary symptoms, h/a, dizziness, n/v, diarrhea, constipation or fever/chills.  She denies headache, visual changes or RUQ abdominal pain.  [facility]   States has not kept down her Glyburide in 2 weeks.  Every time she takes it, she vomits it up.  Does not vomit her vitamin, food or fluids.  Only glyburide.  Missed last appointment due to death in family  RN Note: Pt states that when she went to use the bathroom both this morning and this afternoon the pt saw blood in the toilet. Pt states she is having lower abdominal pain that goes around to her back.        Past Medical History: Past Medical History  Diagnosis Date  . Diabetes mellitus without complication (HCC)     GDM  . Gestational diabetes     G2 & G5, takes insulin  . Asthma     does not use inhaler    Past obstetric history: OB History  Gravida Para Term Preterm AB SAB TAB Ectopic Multiple Living  _0 0   4    # Outcome Date GA Lbr Len/2nd Weight Sex Delivery Anes PTL Lv  6 Current           5 Term 07/01/13 315w0d M CS-LTranv EPI  Y     Complications: Fetal Intolerance  4 Term 04/12/07 3936w0d.317 kg (7 lb 5 oz) F Vag-Spont EPI  Y  3 SAB 2007          2 Term 2003 40w11w1d054 kg (8 lb 15 oz) M Vag-Spont EPI  Y  1 Term 03/01/01 39w071w0d32 kg (7 lb 2 oz) F Vag-Spont EPI  Y      Past Surgical History: Past Surgical History  Procedure Laterality Date  . Cesarean section N/A 07/01/2013     Procedure: Primary Cesarean Section Delivery Baby Boy @ 2038, Apgars 7/9;  Surgeon: John Jonnie Kind  [facility]  Service: Obstetrics;  Laterality: N/A;    Family History: Family History  Problem Relation Age of Onset  . Diabetes Mother   . Hypertension Mother   . Arthritis Mother   . Hyperlipidemia Father   . Heart disease Father   . Stroke Father     Social History: Social History  Substance Use Topics  . Smoking status: Former Smoker    Quit date: 05/02/2015  . Smokeless tobacco: Never Used  . Alcohol Use: No    Allergies: No Known Allergies  Meds:  Prescriptions prior to admission  Medication Sig Dispense Refill Last Dose  . ACCU-CHEK FASTCLIX LANCETS MISC 1 each by Percutaneous route 4 (four) times daily. 100 each 12 Taking  . Blood Glucose Monitoring Suppl (ACCU-CHEK NANO SMARTVIEW) w/Device KIT 1 each by Does not apply route 4 (four) times daily. 1 kit 0 Taking  . fluconazole (DIFLUCAN) 150 MG tablet Take 1 tablet (150 mg total) by mouth once. (Patient not taking:  Reported on 09/30/2015) 1 tablet 1 Not Taking  . glucose blood test strip Use as instructed 50 each 12 Taking  . glyBURIDE (DIABETA) 2.5 MG tablet Take 1-2 tablets (2.5-5 mg total) by mouth 2 (two) times daily with a meal. 1 q am and 2 q hs 60 tablet 8 Taking  . Prenatal Vit-Fe Fumarate-FA (MULTIVITAMIN-PRENATAL) 27-0.8 MG TABS tablet Take 1 tablet by mouth daily at 12 noon.   Taking    I have reviewed patient's Past Medical Hx, Surgical Hx, Family Hx, Social Hx, medications and allergies.   ROS:  Review of Systems  Constitutional: Negative for fever, chills and fatigue.  Respiratory: Negative for shortness of breath.   Gastrointestinal: Positive for nausea, vomiting and abdominal pain. Negative for diarrhea and constipation.  Genitourinary: Positive for flank pain (Did not complain of it, but has it on right), vaginal bleeding and pelvic pain. Negative for dysuria and vaginal pain.   Musculoskeletal: Positive for back pain. Negative for myalgias.  Neurological: Negative for dizziness and headaches.   Other systems negative  Physical Exam  Patient Vitals for the past 24 hrs:  BP Temp Temp src Pulse Resp Height Weight  10/21/15 1913 132/71 mmHg 98.2 F (36.8 C) Oral 82 16 4' 11" (1.499 m) 79.924 kg (176 lb 3.2 oz)   Constitutional: Well-developed, well-nourished female in no acute distress.  Cardiovascular: normal rate and rhythm Respiratory: normal effort, clear to auscultation bilaterally GI: Abd soft, mostly non-tender but mildly tender lower abdomen, gravid appropriate for gestational age.   No rebound or guarding. Back:   + CVA tenderness on right MS: Extremities nontender, no edema, normal ROM Neurologic: Alert and oriented x 4.  GU: Neg CVAT.  PELVIC EXAM: Cervix pink, visually closed, without lesion, scant white creamy discharge (no red discharge), vaginal walls and external genitalia normal Bimanual exam: Cervix soft, posterior, neg CMT, uterus nontender, Fundal Height consistent with dates, adnexa without tenderness, enlargement, or mass  Dilation: Fingertip Effacement (%): Thick Exam by:: M. Layken Doenges, CNM  FHT:  Baseline 145 , moderate variability, small accelerations present, no decelerations Contractions: q 2-4 mins    Labs: No results found for this or any previous visit (from the past 24 hour(s)). O/POS/-- (02/09 0957)  Imaging:  Us Mfm Ob Follow Up  09/30/2015  OBSTETRICAL ULTRASOUND: This exam was performed within a [facility]. The OB US report was generated in the AS system, and faxed to the ordering physician.  This report is available in the [facility]. See the [facility] Obstetric US report via the Image Link.   MAU Course/MDM: I have ordered labs and reviewed results.  Will check UA, Hgb A1C, CBC/diff, CMET (due to not taking Glyburide) NST reviewed  UA noted to be + for nitrites and other signs of infection.  Given  this and her right CVAT, suspect Pyelonephritis.  Will send urine to culture and give Rocephin 2gm, as well as IV fluids  Did not collect FFn due to report of bleeding Procardia ordered for contractions  Consult Dr Ferguson with presentation, exam findings and test results.  He recommends hospitalization at least overnight for antibiotics and observation    Assessment: SIUP at [redacted]w[redacted]d  Preterm contractions Pyelonephritis  Plan: Admit to Antenatal Routine orders Rocephin 2gm q 24 hrs Diabetic diet FBS and 2 hour PC glucoses, to see what they do off glyburide, A1C pending May try to reintroduce glyburide tomorrow (could be pyelo that caused her to vomit pills)    Medication   List    ASK your doctor about these medications        ACCU-CHEK FASTCLIX LANCETS Misc  1 each by Percutaneous route 4 (four) times daily.     ACCU-CHEK NANO SMARTVIEW w/Device Kit  1 each by Does not apply route 4 (four) times daily.     fluconazole 150 MG tablet  Commonly known as:  DIFLUCAN  Take 1 tablet (150 mg total) by mouth once.     glucose blood test strip  Use as instructed     glyBURIDE 2.5 MG tablet  Commonly known as:  DIABETA  Take 1-2 tablets (2.5-5 mg total) by mouth 2 (two) times daily with a meal. 1 q am and 2 q hs     multivitamin-prenatal 27-0.8 MG Tabs tablet  Take 1 tablet by mouth daily at 12 noon.         Taje Tondreau CNM, MSN Certified Nurse-Midwife 10/21/2015 7:37 PM   

## 2015-10-21 NOTE — H&P (Signed)
Chief Complaint:  Abdominal Pain and Urinary Tract Infection   First Provider Initiated Contact with Patient 10/21/15 1919     HPI  Wanda Harris is a 35 y.o. [name] at 42w4dho presents to maternity admissions reporting seeing blood in toilet twice today. Also c/o tightening and cramping, but states did not know they were contractions.  Did not have blood after BM, pretty sure it is vaginal or urinary. She reports good fetal movement, denies LOF, vaginal itching/burning, urinary symptoms, h/a, dizziness, n/v, diarrhea, constipation or fever/chills.  She denies headache, visual changes or RUQ abdominal pain.  [facility]   States has not kept down her Glyburide in 2 weeks.  Every time she takes it, she vomits it up.  Does not vomit her vitamin, food or fluids.  Only glyburide.  Missed last appointment due to death in family  RN Note: Pt states that when she went to use the bathroom both this morning and this afternoon the pt saw blood in the toilet. Pt states she is having lower abdominal pain that goes around to her back.        Past Medical History: Past Medical History  Diagnosis Date  . Diabetes mellitus without complication (HCC)     GDM  . Gestational diabetes     G2 & G5, takes insulin  . Asthma     does not use inhaler    Past obstetric history: OB History  Gravida Para Term Preterm AB SAB TAB Ectopic Multiple Living  _0 0   4    # Outcome Date GA Lbr Len/2nd Weight Sex Delivery Anes PTL Lv  6 Current           5 Term 07/01/13 358w0d M CS-LTranv EPI  Y     Complications: Fetal Intolerance  4 Term 04/12/07 3960w0d.317 kg (7 lb 5 oz) F Vag-Spont EPI  Y  3 SAB 2007          2 Term 2003 40w44w1d054 kg (8 lb 15 oz) M Vag-Spont EPI  Y  1 Term 03/01/01 39w070w0d32 kg (7 lb 2 oz) F Vag-Spont EPI  Y      Past Surgical History: Past Surgical History  Procedure Laterality Date  . Cesarean section N/A 07/01/2013     Procedure: Primary Cesarean Section Delivery Baby Boy @ 2038, Apgars 7/9;  Surgeon: John Jonnie Kind  [facility]  Service: Obstetrics;  Laterality: N/A;    Family History: Family History  Problem Relation Age of Onset  . Diabetes Mother   . Hypertension Mother   . Arthritis Mother   . Hyperlipidemia Father   . Heart disease Father   . Stroke Father     Social History: Social History  Substance Use Topics  . Smoking status: Former Smoker    Quit date: 05/02/2015  . Smokeless tobacco: Never Used  . Alcohol Use: No    Allergies: No Known Allergies  Meds:  Prescriptions prior to admission  Medication Sig Dispense Refill Last Dose  . ACCU-CHEK FASTCLIX LANCETS MISC 1 each by Percutaneous route 4 (four) times daily. 100 each 12 Taking  . Blood Glucose Monitoring Suppl (ACCU-CHEK NANO SMARTVIEW) w/Device KIT 1 each by Does not apply route 4 (four) times daily. 1 kit 0 Taking  . fluconazole (DIFLUCAN) 150 MG tablet Take 1 tablet (150 mg total) by mouth once. (Patient not taking:  Reported on 09/30/2015) 1 tablet 1 Not Taking  . glucose blood test strip Use as instructed 50 each 12 Taking  . glyBURIDE (DIABETA) 2.5 MG tablet Take 1-2 tablets (2.5-5 mg total) by mouth 2 (two) times daily with a meal. 1 q am and 2 q hs 60 tablet 8 Taking  . Prenatal Vit-Fe Fumarate-FA (MULTIVITAMIN-PRENATAL) 27-0.8 MG TABS tablet Take 1 tablet by mouth daily at 12 noon.   Taking    I have reviewed patient's Past Medical Hx, Surgical Hx, Family Hx, Social Hx, medications and allergies.   ROS:  Review of Systems  Constitutional: Negative for fever, chills and fatigue.  Respiratory: Negative for shortness of breath.   Gastrointestinal: Positive for nausea, vomiting and abdominal pain. Negative for diarrhea and constipation.  Genitourinary: Positive for flank pain (Did not complain of it, but has it on right), vaginal bleeding and pelvic pain. Negative for dysuria and vaginal pain.   Musculoskeletal: Positive for back pain. Negative for myalgias.  Neurological: Negative for dizziness and headaches.   Other systems negative  Physical Exam  Patient Vitals for the past 24 hrs:  BP Temp Temp src Pulse Resp Height Weight  10/21/15 1913 132/71 mmHg 98.2 F (36.8 C) Oral 82 16 4' 11" (1.499 m) 79.924 kg (176 lb 3.2 oz)   Constitutional: Well-developed, well-nourished female in no acute distress.  Cardiovascular: normal rate and rhythm Respiratory: normal effort, clear to auscultation bilaterally GI: Abd soft, mostly non-tender but mildly tender lower abdomen, gravid appropriate for gestational age.   No rebound or guarding. Back:   + CVA tenderness on right MS: Extremities nontender, no edema, normal ROM Neurologic: Alert and oriented x 4.  GU: Neg CVAT.  PELVIC EXAM: Cervix pink, visually closed, without lesion, scant white creamy discharge (no red discharge), vaginal walls and external genitalia normal Bimanual exam: Cervix soft, posterior, neg CMT, uterus nontender, Fundal Height consistent with dates, adnexa without tenderness, enlargement, or mass  Dilation: Fingertip Effacement (%): Thick Exam by:: Carmelia Roller, CNM  FHT:  Baseline 145 , moderate variability, small accelerations present, no decelerations Contractions: q 2-4 mins    Labs: No results found for this or any previous visit (from the past 24 hour(s)). O/POS/-- (02/09 0957)  Imaging:  [place] Mfm Ob Follow Up  09/30/2015  OBSTETRICAL ULTRASOUND: This exam was performed within a [place] [facility]. The OB US report was generated in the [facility] system, and faxed to the ordering physician.  This report is available in the [employer]. See the [facility] Obstetric US report via the Image Link.   MAU Course/MDM: I have ordered labs and reviewed results.  Will check UA, Hgb A1C, CBC/diff, CMET (due to not taking Glyburide) NST reviewed  UA noted to be + for nitrites and other signs of infection.  Given  this and her right CVAT, suspect Pyelonephritis.  Will send urine to culture and give Rocephin 2gm, as well as IV fluids  Did not collect FFn due to report of bleeding Procardia ordered for contractions  Consult Dr Glo Herring with presentation, exam findings and test results.  He recommends hospitalization at least overnight for antibiotics and observation    Assessment: SIUP at [redacted]w[redacted]d Preterm contractions Pyelonephritis  Plan: Admit to Antenatal Routine orders Rocephin 2gm q 24 hrs Diabetic diet FBS and 2 hour PC glucoses, to see what they do off glyburide, A1C pending May try to reintroduce glyburide tomorrow (could be pyelo that caused her to vomit pills)    Medication  List    ASK your doctor about these medications        ACCU-CHEK FASTCLIX LANCETS Misc  1 each by Percutaneous route 4 (four) times daily.     ACCU-CHEK NANO SMARTVIEW w/Device Kit  1 each by Does not apply route 4 (four) times daily.     fluconazole 150 MG tablet  Commonly known as:  DIFLUCAN  Take 1 tablet (150 mg total) by mouth once.     glucose blood test strip  Use as instructed     glyBURIDE 2.5 MG tablet  Commonly known as:  DIABETA  Take 1-2 tablets (2.5-5 mg total) by mouth 2 (two) times daily with a meal. 1 q am and 2 q hs     multivitamin-prenatal 27-0.8 MG Tabs tablet  Take 1 tablet by mouth daily at 12 noon.         Hansel Feinstein CNM, MSN Certified Nurse-Midwife 10/21/2015 7:37 PM

## 2015-10-22 ENCOUNTER — Encounter (HOSPITAL_COMMUNITY): Payer: Self-pay | Admitting: *Deleted

## 2015-10-22 DIAGNOSIS — N12 Tubulo-interstitial nephritis, not specified as acute or chronic: Secondary | ICD-10-CM

## 2015-10-22 DIAGNOSIS — O24414 Gestational diabetes mellitus in pregnancy, insulin controlled: Secondary | ICD-10-CM

## 2015-10-22 DIAGNOSIS — O2303 Infections of kidney in pregnancy, third trimester: Principal | ICD-10-CM

## 2015-10-22 DIAGNOSIS — Z3A31 31 weeks gestation of pregnancy: Secondary | ICD-10-CM

## 2015-10-22 LAB — HEMOGLOBIN A1C
Hgb A1c MFr Bld: 9 % — ABNORMAL HIGH (ref 4.8–5.6)
MEAN PLASMA GLUCOSE: 212 mg/dL

## 2015-10-22 LAB — GLUCOSE, CAPILLARY
GLUCOSE-CAPILLARY: 225 mg/dL — AB (ref 65–99)
GLUCOSE-CAPILLARY: 228 mg/dL — AB (ref 65–99)
Glucose-Capillary: 227 mg/dL — ABNORMAL HIGH (ref 65–99)
Glucose-Capillary: 204 mg/dL — ABNORMAL HIGH (ref 65–99)

## 2015-10-22 MED ORDER — GLYBURIDE 2.5 MG PO TABS
2.5000 mg | ORAL_TABLET | Freq: Every day | ORAL | Status: DC
  Administered 2015-10-23: 2.5 mg via ORAL
  Filled 2015-10-22 (×2): qty 1

## 2015-10-22 MED ORDER — GLYBURIDE 5 MG PO TABS
5.0000 mg | ORAL_TABLET | Freq: Every day | ORAL | Status: DC
  Administered 2015-10-22: 5 mg via ORAL
  Filled 2015-10-22 (×2): qty 1

## 2015-10-22 NOTE — Progress Notes (Signed)
Patient ID: Wanda Harris, female   DOB: 1981-02-20, 35 y.o.   MRN: [id number] [place]) NOTE  Wanda Harris is a 35 y.o. [id number] at [redacted]w[redacted]d  who is admitted for presumed pyelonephritis in third trimester.    Length of Stay:  1  Days  Date of admission:10/21/2015  Subjective: Patient reports slowly feeling better.  Patient reports the fetal movement as active. Patient reports uterine contraction  activity as irregular Patient reports  vaginal bleeding as none. Patient describes fluid per vagina as None.  Vitals:  Blood pressure 90/50, pulse 84, temperature 98.2 F (36.8 C), temperature source Oral, resp. rate 16, height 4\' 11"  (1.499 m), weight 176 lb (79.833 kg), last menstrual period 03/14/2015, unknown if currently breastfeeding. Filed Vitals:   10/22/15 0018 10/22/15 0507 10/22/15 0751 10/22/15 0752  BP: 100/53 100/79  90/50  Pulse: 94 77  84  Temp:   98.2 F (36.8 C)   TempSrc:   Oral   Resp:      Height:      Weight:       Physical Examination:  General appearance - alert, well appearing, and in no distress Fundal Height:  size equals dates Pelvic Exam:  examination not indicated Cervical Exam: Not evaluated.  Extremities: extremities normal, atraumatic, no cyanosis or edema with DTRs 2+ bilaterally  Back: Positive right CVA tenderness Membranes:intact  Fetal Monitoring:  Baseline: 140 bpm, Variability: Good {> 6 bpm), Accelerations: Reactive and Decelerations: Absent    Toco: No contractions  Labs:  Results for orders placed or performed during the hospital encounter of 10/21/15 (from the past 24 hour(s))  Urinalysis, Routine w reflex microscopic (not at [facility])   Collection Time: 10/21/15  7:00 PM  Result Value Ref Range   Color, Urine YELLOW YELLOW   APPearance CLEAR CLEAR   Specific Gravity, Urine 1.025 1.005 - 1.030   pH 6.0 5.0 - 8.0   Glucose, UA 500 (A) NEGATIVE mg/dL   Hgb urine dipstick SMALL (A) NEGATIVE   Bilirubin Urine  NEGATIVE NEGATIVE   Ketones, ur 15 (A) NEGATIVE mg/dL   Protein, ur NEGATIVE NEGATIVE mg/dL   Nitrite POSITIVE (A) NEGATIVE   Leukocytes, UA NEGATIVE NEGATIVE  Urine microscopic-add on   Collection Time: 10/21/15  7:00 PM  Result Value Ref Range   Squamous Epithelial / LPF 0-5 (A) NONE SEEN   WBC, UA 0-5 0 - 5 WBC/hpf   RBC / HPF 6-30 0 - 5 RBC/hpf   Bacteria, UA MANY (A) NONE SEEN  Wet prep, genital   Collection Time: 10/21/15  7:25 PM  Result Value Ref Range   Yeast Wet Prep HPF POC NONE SEEN NONE SEEN   Trich, Wet Prep NONE SEEN NONE SEEN   Clue Cells Wet Prep HPF POC NONE SEEN NONE SEEN   WBC, Wet Prep HPF POC MANY (A) NONE SEEN   Sperm NONE SEEN   Glucose, capillary   Collection Time: 10/21/15  7:38 PM  Result Value Ref Range   Glucose-Capillary 143 (H) 65 - 99 mg/dL  Hemoglobin A1c   Collection Time: 10/21/15  8:15 PM  Result Value Ref Range   Hgb A1c MFr Bld 9.0 (H) 4.8 - 5.6 %   Mean Plasma Glucose 212 mg/dL  Comprehensive metabolic panel   Collection Time: 10/21/15  8:15 PM  Result Value Ref Range   Sodium 136 135 - 145 mmol/L   Potassium 3.8 3.5 - 5.1 mmol/L   Chloride 109 101 - 111 mmol/L  CO2 19 (L) 22 - 32 mmol/L   Glucose, Bld 146 (H) 65 - 99 mg/dL   BUN 10 6 - 20 mg/dL   Creatinine, Ser 0.53 0.44 - 1.00 mg/dL   Calcium 9.0 8.9 - 10.3 mg/dL   Total Protein 6.7 6.5 - 8.1 g/dL   Albumin 2.8 (L) 3.5 - 5.0 g/dL   AST 12 (L) 15 - 41 U/L   ALT 11 (L) 14 - 54 U/L   Alkaline Phosphatase 136 (H) 38 - 126 U/L   Total Bilirubin 0.4 0.3 - 1.2 mg/dL   GFR calc non Af Amer >60 >60 mL/min   GFR calc Af Amer >60 >60 mL/min   Anion gap 8 5 - 15  CBC with Differential   Collection Time: 10/21/15  8:15 PM  Result Value Ref Range   WBC 10.1 4.0 - 10.5 K/uL   RBC 4.51 3.87 - 5.11 MIL/uL   Hemoglobin 10.3 (L) 12.0 - 15.0 g/dL   HCT 32.3 (L) 36.0 - 46.0 %   MCV 71.6 (L) 78.0 - 100.0 fL   MCH 22.8 (L) 26.0 - 34.0 pg   MCHC 31.9 30.0 - 36.0 g/dL   RDW 14.3 11.5 - 15.5  %   Platelets 276 150 - 400 K/uL   Neutrophils Relative % 66 %   Neutro Abs 6.4 1.7 - 7.7 K/uL   Lymphocytes Relative 29 %   Lymphs Abs 2.8 0.7 - 4.0 K/uL   Monocytes Relative 4 %   Monocytes Absolute 0.4 0.1 - 1.0 K/uL   Eosinophils Relative 1 %   Eosinophils Absolute 0.1 0.0 - 0.7 K/uL   Basophils Relative 0 %   Basophils Absolute 0.0 0.0 - 0.1 K/uL  Type and screen [place]   Collection Time: 10/21/15  8:15 PM  Result Value Ref Range   ABO/RH(D) O POS    Antibody Screen NEG    Sample Expiration 10/24/2015   Glucose, capillary   Collection Time: 10/21/15 10:19 PM  Result Value Ref Range   Glucose-Capillary 126 (H) 65 - 99 mg/dL  Glucose, capillary   Collection Time: 10/22/15 12:12 AM  Result Value Ref Range   Glucose-Capillary 228 (H) 65 - 99 mg/dL    Imaging Studies:      Medications:  Scheduled . cefTRIAXone (ROCEPHIN)  IV  2 g Intravenous Q24H  . docusate sodium  100 mg Oral Daily  . prenatal multivitamin  1 tablet Oral Q1200     ASSESSMENT: [id number] [redacted]w[redacted]d Estimated Date of Delivery: 12/19/15  Patient Active Problem List   Diagnosis Date Noted  . Pyelonephritis affecting pregnancy in third trimester 10/22/2015  . Pyelonephritis affecting pregnancy in third trimester, antepartum 10/21/2015  . Trichomonal vaginitis in pregnancy 08/06/2015  . Supervision of young multigravida, antepartum 08/04/2015  . GBS (group B streptococcus) UTI complicating pregnancy [id number]  . Gestational diabetes mellitus (GDM) affecting pregnancy 08/04/2015  . Uterine scar from previous surgery affecting pregnancy 08/04/2015    PLAN: 35 yo [id number] at [redacted]w[redacted]d admitted with presumed pyelonephritis  1) Pyelonephritis - Patient remains afebrile since admission - Continue IV rocephin - Discharge planning tomorrow if remains stable  2) GDM - Patient restarted on glyburide - Will monitor CBGs   3) Fetal status - FHT- category I - Continue current antepartum  care   Wanda Harris 10/22/2015,9:09 AM

## 2015-10-23 DIAGNOSIS — O2303 Infections of kidney in pregnancy, third trimester: Secondary | ICD-10-CM | POA: Diagnosis not present

## 2015-10-23 LAB — GLUCOSE, CAPILLARY
GLUCOSE-CAPILLARY: 104 mg/dL — AB (ref 65–99)
Glucose-Capillary: 160 mg/dL — ABNORMAL HIGH (ref 65–99)

## 2015-10-23 MED ORDER — METFORMIN HCL 500 MG PO TABS: 500.0000 mg | ORAL_TABLET | Freq: Two times a day (BID) | ORAL | Status: DC

## 2015-10-23 MED ORDER — DOCUSATE SODIUM 100 MG PO CAPS: 100.0000 mg | ORAL_CAPSULE | Freq: Every day | ORAL | Status: DC

## 2015-10-23 MED ORDER — OXYCODONE-ACETAMINOPHEN 5-325 MG PO TABS: 1.0000 | ORAL_TABLET | ORAL | Status: DC | PRN

## 2015-10-23 MED ORDER — CEPHALEXIN 500 MG PO CAPS: 500.0000 mg | ORAL_CAPSULE | Freq: Four times a day (QID) | ORAL | Status: DC

## 2015-10-23 MED ORDER — ACETAMINOPHEN 325 MG PO TABS: 650.0000 mg | ORAL_TABLET | ORAL | Status: DC | PRN

## 2015-10-23 NOTE — Discharge Instructions (Signed)
Pyelonephritis, Adult °Pyelonephritis is a kidney infection. The kidneys are organs that help clean your blood by moving waste out of your blood and into your pee (urine). This infection can happen quickly, or it can last for a long time. In most cases, it clears up with treatment and does not cause other problems. °HOME CARE °Medicines °· Take over-the-counter and prescription medicines only as told by your doctor. °· Take your antibiotic medicine as told by your doctor. Do not stop taking the medicine even if you start to feel better. °General Instructions °· Drink enough fluid to keep your pee clear or pale yellow. °· Avoid caffeine, tea, and carbonated drinks. °· Pee (urinate) often. Avoid holding in pee for long periods of time. °· Pee before and after sex. °· After pooping (having a bowel movement), women should wipe from front to back. Use each tissue only once. °· Keep all follow-up visits as told by your doctor. This is important. °GET HELP IF: °· You do not feel better after 2 days. °· Your symptoms get worse. °· You have a fever. °GET HELP RIGHT AWAY IF: °· You cannot take your medicine or drink fluids as told. °· You have chills and shaking. °· You throw up (vomit). °· You have very bad pain in your side (flank) or back. °· You feel very weak or you pass out (faint). °  °This information is not intended to replace advice given to you by your health care provider. Make sure you discuss any questions you have with your health care provider. °  °Document Released: 07/19/2004 Document Revised: 03/02/2015 Document Reviewed: 10/04/2014 °Elsevier Interactive Patient Education ©2016 [employer] Inc. ° °

## 2015-10-23 NOTE — Discharge Summary (Addendum)
OB Discharge Summary     Patient Name: Wanda Harris DOB: 1980-10-20 MRN: [id number]  Date of admission: 10/21/2015 Delivering MD: This patient has no babies on file.  Date of discharge: 10/23/2015  Admitting diagnosis: 31w bleeding while urinating, abd pain Intrauterine pregnancy: [redacted]w[redacted]d     Secondary diagnosis:  Active Problems:   Pyelonephritis affecting pregnancy in third trimester, antepartum   Pyelonephritis affecting pregnancy in third trimester  Additional problems: GDM     Discharge diagnosis: same                                   Hospital course:  35 yo G6P4 at [place] 6d was admitted for presumed pyelonephritis as a result of a positive UA and presence of right CVA tenderness on exam. Patient was treated with IV Rocephin and her pain was managed as needed. Patient is also GDM on glyburide. Her glyburide was restarted the day following admission. The patient reported that her pain improved slowly. She denied contractions, leakage of fluid, or vaginal bleeding. Patient remained afebrile throughout her admission. She was found stable for discharge with plans to follow up for routine prenatal care later this week. Discharge instructions provided  Physical exam  Filed Vitals:   10/22/15 1953 10/22/15 2119 10/22/15 2327 10/23/15 0629  BP: 101/52  95/39 89/52  Pulse: 80  82 76  Temp: 97.8 F (36.6 C) 98.2 F (36.8 C) 98.2 F (36.8 C) 98.3 F (36.8 C)  TempSrc: Oral Oral Oral Oral  Resp:   18 18  Height:      Weight:       General: alert, cooperative and no distress Abdomen: soft, gravid, NT Back: positive CVA tenderness on right DVT Evaluation: No evidence of DVT seen on physical exam. No cords or calf tenderness.  FHT: baseline 150, mod variability, +accels, no decels Toco: no contractions  Labs: Lab Results  Component Value Date   WBC 10.1 10/21/2015   HGB 10.3* 10/21/2015   HCT 32.3* 10/21/2015   MCV 71.6* 10/21/2015   PLT 276 10/21/2015   CMP Latest Ref  Rng 10/21/2015  Glucose 65 - 99 mg/dL 146(H)  BUN 6 - 20 mg/dL 10  Creatinine 0.44 - 1.00 mg/dL 0.53  Sodium 135 - 145 mmol/L 136  Potassium 3.5 - 5.1 mmol/L 3.8  Chloride 101 - 111 mmol/L 109  CO2 22 - 32 mmol/L 19(L)  Calcium 8.9 - 10.3 mg/dL 9.0  Total Protein 6.5 - 8.1 g/dL 6.7  Total Bilirubin 0.3 - 1.2 mg/dL 0.4  Alkaline Phos 38 - 126 U/L 136(H)  AST 15 - 41 U/L 12(L)  ALT 14 - 54 U/L 11(L)    Discharge instruction: per After Visit Summary and "Baby and Me Booklet".  After visit meds:    Medication List    STOP taking these medications        fluconazole 150 MG tablet  Commonly known as:  DIFLUCAN      TAKE these medications        acetaminophen 325 MG tablet  Commonly known as:  TYLENOL  Take 2 tablets (650 mg total) by mouth every 4 (four) hours as needed (for pain scale < 4  OR  temperature  >/=  100.5 F).     cephALEXin 500 MG capsule  Commonly known as:  KEFLEX  Take 1 capsule (500 mg total) by mouth 4 (four) times daily.  docusate sodium 100 MG capsule  Commonly known as:  COLACE  Take 1 capsule (100 mg total) by mouth daily.     glyBURIDE 2.5 MG tablet  Commonly known as:  DIABETA  Take 1-2 tablets (2.5-5 mg total) by mouth 2 (two) times daily with a meal. 1 q am and 2 q hs     multivitamin-prenatal 27-0.8 MG Tabs tablet  Take 1 tablet by mouth daily at 12 noon.     oxyCODONE-acetaminophen 5-325 MG tablet  Commonly known as:  PERCOCET/ROXICET  Take 1 tablet by mouth every 4 (four) hours as needed.        Diet: carb modified diet  Activity: Advance as tolerated.   Outpatient follow up: this week to reevaluate CBGs Follow up Appt: Future Appointments Date Time Provider [place]  10/28/2015 3:15 PM [place] 2 [facility] M[facility]-US   Follow up Visit:No Follow-up on file.     10/23/2015 CONSTANT,PEGGY, MD   Patient acknowledges that she has been throwing up her glyburide consistently, and cbg's have been significantly elevated for  last 2 wks. We have discussed changing diabetic agents, and will switch to Metformin as initial agent, and if nausea recurs with this , consider adding zofran or reglan prior to Metformin. CBG (last 3)   Recent Labs  10/22/15 2325 10/23/15 0626 10/23/15 1257  GLUCAP 225* 104* 160*  Will d/c at this time for short interval followup at [facility].

## 2015-10-24 LAB — CULTURE, OB URINE: Special Requests: NORMAL

## 2015-10-28 ENCOUNTER — Encounter (HOSPITAL_COMMUNITY): Payer: Self-pay

## 2015-10-28 ENCOUNTER — Other Ambulatory Visit (HOSPITAL_COMMUNITY): Payer: Self-pay | Admitting: Maternal and Fetal Medicine

## 2015-10-28 ENCOUNTER — Ambulatory Visit (HOSPITAL_COMMUNITY)
Admission: RE | Admit: 2015-10-28 | Discharge: 2015-10-28 | Disposition: A | Discharge status: Home / Self Care | Payer: Medicaid Other | Source: Ambulatory Visit | Attending: Obstetrics & Gynecology | Admitting: Obstetrics & Gynecology

## 2015-10-28 VITALS — BP 114/70 | HR 85 | Wt 178.0 lb

## 2015-10-28 DIAGNOSIS — O34219 Maternal care for unspecified type scar from previous cesarean delivery: Secondary | ICD-10-CM | POA: Diagnosis not present

## 2015-10-28 DIAGNOSIS — O24415 Gestational diabetes mellitus in pregnancy, controlled by oral hypoglycemic drugs: Secondary | ICD-10-CM

## 2015-10-28 DIAGNOSIS — Z3A32 32 weeks gestation of pregnancy: Secondary | ICD-10-CM | POA: Insufficient documentation

## 2015-10-28 DIAGNOSIS — O24419 Gestational diabetes mellitus in pregnancy, unspecified control: Secondary | ICD-10-CM

## 2015-10-28 DIAGNOSIS — O09293 Supervision of pregnancy with other poor reproductive or obstetric history, third trimester: Secondary | ICD-10-CM | POA: Diagnosis not present

## 2015-10-28 DIAGNOSIS — O09623 Supervision of young multigravida, third trimester: Secondary | ICD-10-CM

## 2015-10-28 DIAGNOSIS — O3429 Maternal care due to uterine scar from other previous surgery: Secondary | ICD-10-CM

## 2015-10-31 ENCOUNTER — Encounter: Payer: Medicaid Other | Attending: Family Medicine | Admitting: Dietician

## 2015-10-31 ENCOUNTER — Ambulatory Visit (INDEPENDENT_AMBULATORY_CARE_PROVIDER_SITE_OTHER): Payer: Medicaid Other | Admitting: Obstetrics & Gynecology

## 2015-10-31 VITALS — BP 106/61 | HR 71 | Wt 179.0 lb

## 2015-10-31 DIAGNOSIS — O24415 Gestational diabetes mellitus in pregnancy, controlled by oral hypoglycemic drugs: Secondary | ICD-10-CM | POA: Insufficient documentation

## 2015-10-31 DIAGNOSIS — O2343 Unspecified infection of urinary tract in pregnancy, third trimester: Secondary | ICD-10-CM | POA: Diagnosis not present

## 2015-10-31 DIAGNOSIS — O24419 Gestational diabetes mellitus in pregnancy, unspecified control: Secondary | ICD-10-CM | POA: Diagnosis not present

## 2015-10-31 LAB — POCT URINALYSIS DIP (DEVICE)
Bilirubin Urine: NEGATIVE
Glucose, UA: 500 mg/dL — AB
HGB URINE DIPSTICK: NEGATIVE
Ketones, ur: NEGATIVE mg/dL
NITRITE: NEGATIVE
PH: 5 (ref 5.0–8.0)
Protein, ur: 30 mg/dL — AB
Specific Gravity, Urine: 1.015 (ref 1.005–1.030)
UROBILINOGEN UA: 0.2 mg/dL (ref 0.0–1.0)

## 2015-10-31 NOTE — Progress Notes (Signed)
Trouble with glucometer and no values to review  Subjective:recent tx for pyelonephritis  Wanda Harris is a 35 y.o. [id number] at [redacted]w[redacted]d being seen today for ongoing prenatal care.  She is currently monitored for the following issues for this high-risk pregnancy and has Supervision of young multigravida, antepartum; GBS (group B streptococcus) UTI complicating pregnancy; Gestational diabetes mellitus (GDM) affecting pregnancy; Uterine scar from previous surgery affecting pregnancy; Trichomonal vaginitis in pregnancy; Pyelonephritis affecting pregnancy in third trimester, antepartum; and Pyelonephritis affecting pregnancy in third trimester on her problem list.  Patient reports no complaints.  Contractions: Not present.  .  Movement: Present. Denies leaking of fluid.   The following portions of the patient's history were reviewed and updated as appropriate: allergies, current medications, past family history, past medical history, past social history, past surgical history and problem list. Problem list updated.  Objective:   Filed Vitals:   10/31/15 1028  BP: 106/61  Pulse: 71  Weight: 179 lb (81.194 kg)    Fetal Status: Fetal Heart Rate (bpm): 140   Movement: Present     General:  Alert, oriented and cooperative. Patient is in no acute distress.  Skin: Skin is warm and dry. No rash noted.   Cardiovascular: Normal heart rate noted  Respiratory: Normal respiratory effort, no problems with respiration noted  Abdomen: Soft, gravid, appropriate for gestational age. Pain/Pressure: Absent     Pelvic:       Cervical exam deferred        Extremities: Normal range of motion.  Edema: None  Mental Status: Normal mood and affect. Normal behavior. Normal judgment and thought content.   Urinalysis:      Assessment and Plan:  Pregnancy: [id number] at [redacted]w[redacted]d  1. Gestational diabetes mellitus (GDM) affecting pregnancy Will check her meter and reestablish testing 4/day NST 2/week, had BPP 8/8 3 days ago,  polyhydramnios Preterm labor symptoms and general obstetric precautions including but not limited to vaginal bleeding, contractions, leaking of fluid and fetal movement were reviewed in detail with the patient. Please refer to After Visit Summary for other counseling recommendations.  Return in about 1 week (around 11/07/2015).   Woodroe Mode, MD

## 2015-10-31 NOTE — Patient Instructions (Signed)
Gestational Diabetes Mellitus  Gestational diabetes mellitus, often simply referred to as gestational diabetes, is a type of diabetes that some women develop during pregnancy. In gestational diabetes, the pancreas does not make enough insulin (a hormone), the cells are less responsive to the insulin that is made (insulin resistance), or both. Normally, insulin moves sugars from food into the tissue cells. The tissue cells use the sugars for energy. The lack of insulin or the lack of normal response to insulin causes excess sugars to build up in the blood instead of going into the tissue cells. As a result, high blood sugar (hyperglycemia) develops. The effect of high sugar (glucose) levels can cause many problems.   RISK FACTORS  You have an increased chance of developing gestational diabetes if you have a family history of diabetes and also have one or more of the following risk factors:  · A body mass index over 30 (obesity).  · A previous pregnancy with gestational diabetes.  · An older age at the time of pregnancy.  If blood glucose levels are kept in the normal range during pregnancy, women can have a healthy pregnancy. If your blood glucose levels are not well controlled, there may be risks to you, your unborn baby (fetus), your labor and delivery, or your newborn baby.   SYMPTOMS   If symptoms are experienced, they are much like symptoms you would normally expect during pregnancy. The symptoms of gestational diabetes include:   · Increased thirst (polydipsia).  · Increased urination (polyuria).  · Increased urination during the night (nocturia).  · Weight loss. This weight loss may be rapid.  · Frequent, recurring infections.  · Tiredness (fatigue).  · Weakness.  · Vision changes, such as blurred vision.  · Fruity smell to your breath.  · Abdominal pain.  DIAGNOSIS  Diabetes is diagnosed when blood glucose levels are increased. Your blood glucose level may be checked by one or more of the following blood  tests:  · A fasting blood glucose test. You will not be allowed to eat for at least 8 hours before a blood sample is taken.  · A random blood glucose test. Your blood glucose is checked at any time of the day regardless of when you ate.  · An oral glucose tolerance test (OGTT). Your blood glucose is measured after you have not eaten (fasted) for 1-3 hours and then after you drink a glucose-containing beverage. Since the hormones that cause insulin resistance are highest at about 24-28 weeks of a pregnancy, an OGTT is usually performed during that time. If you have risk factors, you may be screened for undiagnosed type 2 diabetes at your first prenatal visit.  TREATMENT   Gestational diabetes should be managed first with diet and exercise. Medicines may be added only if they are needed.  · You will need to take diabetes medicine or insulin daily to keep blood glucose levels in the desired range.  · You will need to match insulin dosing with exercise and healthy food choices.  If you have gestational diabetes, your treatment goal is to maintain the following blood glucose levels:  · Before meals (preprandial): at or below 95 mg/dL.  · After meals (postprandial):    One hour after a meal: at or below 140 mg/dL.    Two hours after a meal: at or below 120 mg/dL.  If you have pre-existing type 1 or type 2 diabetes, your treatment goal is to maintain the following blood glucose levels:  · Before   meals, at bedtime, and overnight: 60-99 mg/dL.  · After meals: peak of 100-129 mg/dL.  HOME CARE INSTRUCTIONS   · Have your hemoglobin A1c level checked twice a year.  · Perform daily blood glucose monitoring as directed by your health care provider. It is common to perform frequent blood glucose monitoring.  · Monitor urine ketones when you are ill and as directed by your health care provider.  · Take your diabetes medicine and insulin as directed by your health care provider to maintain your blood glucose level in the desired  range.  ¨ Never run out of diabetes medicine or insulin. It is needed every day.  ¨ Adjust insulin based on your intake of carbohydrates. Carbohydrates can raise blood glucose levels but need to be included in your diet. Carbohydrates provide vitamins, minerals, and fiber which are an essential part of a healthy diet. Carbohydrates are found in fruits, vegetables, whole grains, dairy products, legumes, and foods containing added sugars.  · Eat healthy foods. Alternate 3 meals with 3 snacks.  · Maintain a healthy weight gain. The usual total expected weight gain varies according to your prepregnancy body mass index (BMI).  · Carry a medical alert card or wear your medical alert jewelry.  · Carry a 15-gram carbohydrate snack with you at all times to treat low blood glucose (hypoglycemia). Some examples of 15-gram carbohydrate snacks include:  ¨ Glucose tablets, 3 or 4.  ¨ Glucose gel, 15-gram tube.  ¨ Raisins, 2 tablespoons (24 g).  ¨ Jelly beans, 6.  ¨ Animal crackers, 8.  ¨ Fruit juice, regular soda, or low-fat milk, 4 ounces (120 mL).  ¨ Gummy treats, 9.  · Recognize hypoglycemia. Hypoglycemia during pregnancy occurs with blood glucose levels of 60 mg/dL and below. The risk for hypoglycemia increases when fasting or skipping meals, during or after intense exercise, and during sleep. Hypoglycemia symptoms can include:  ¨ Tremors or shakes.  ¨ Decreased ability to concentrate.  ¨ Sweating.  ¨ Increased heart rate.  ¨ Headache.  ¨ Dry mouth.  ¨ Hunger.  ¨ Irritability.  ¨ Anxiety.  ¨ Restless sleep.  ¨ Altered speech or coordination.  ¨ Confusion.  · Treat hypoglycemia promptly. If you are alert and able to safely swallow, follow the 15:15 rule:  ¨ Take 15-20 grams of rapid-acting glucose or carbohydrate. Rapid-acting options include glucose gel, glucose tablets, or 4 ounces (120 mL) of fruit juice, regular soda, or low-fat milk.  ¨ Check your blood glucose level 15 minutes after taking the glucose.  ¨ Take 15-20  grams more of glucose if the repeat blood glucose level is still 70 mg/dL or below.  ¨ Eat a meal or snack within 1 hour once blood glucose levels return to normal.  · Be alert to polyuria (excess urination) and polydipsia (excess thirst) which are early signs of hyperglycemia. An early awareness of hyperglycemia allows for prompt treatment. Treat hyperglycemia as directed by your health care provider.  · Engage in at least 30 minutes of physical activity a day or as directed by your health care provider. Ten minutes of physical activity timed 30 minutes after each meal is encouraged to control postprandial blood glucose levels.  · Adjust your insulin dosing and food intake as needed if you start a new exercise or sport.  · Follow your sick-day plan at any time you are unable to eat or drink as usual.  · Avoid tobacco and alcohol use.  · Keep all follow-up visits as directed   by your health care provider.  · Follow the advice of your health care provider regarding your prenatal and post-delivery (postpartum) appointments, meal planning, exercise, medicines, vitamins, blood tests, other medical tests, and physical activities.  · Perform daily skin and foot care. Examine your skin and feet daily for cuts, bruises, redness, nail problems, bleeding, blisters, or sores.  · Brush your teeth and gums at least twice a day and floss at least once a day. Follow up with your dentist regularly.  · Schedule an eye exam during the first trimester of your pregnancy or as directed by your health care provider.  · Share your diabetes management plan with your workplace or school.  · Stay up-to-date with immunizations.  · Learn to manage stress.  · Obtain ongoing diabetes education and support as needed.  · Learn about and consider breastfeeding your baby.  · You should have your blood sugar level checked 6-12 weeks after delivery. This is done with an oral glucose tolerance test (OGTT).  SEEK MEDICAL CARE IF:   · You are unable to  eat food or drink fluids for more than 6 hours.  · You have nausea and vomiting for more than 6 hours.  · You have a blood glucose level of 200 mg/dL and you have ketones in your urine.  · There is a change in mental status.  · You develop vision problems.  · You have a persistent headache.  · You have upper abdominal pain or discomfort.  · You develop an additional serious illness.  · You have diarrhea for more than 6 hours.  · You have been sick or have had a fever for a couple of days and are not getting better.  SEEK IMMEDIATE MEDICAL CARE IF:   · You have difficulty breathing.  · You no longer feel the baby moving.  · You are bleeding or have discharge from your vagina.  · You start having premature contractions or labor.  MAKE SURE YOU:  · Understand these instructions.  · Will watch your condition.  · Will get help right away if you are not doing well or get worse.     This information is not intended to replace advice given to you by your health care provider. Make sure you discuss any questions you have with your health care provider.     Document Released: 09/17/2000 Document Revised: 07/02/2014 Document Reviewed: 01/08/2012  Elsevier Interactive Patient Education ©2016 [employer]

## 2015-11-04 ENCOUNTER — Ambulatory Visit (HOSPITAL_COMMUNITY)
Admission: RE | Admit: 2015-11-04 | Discharge: 2015-11-04 | Disposition: A | Discharge status: Home / Self Care | Payer: Medicaid Other | Source: Ambulatory Visit | Attending: Obstetrics & Gynecology | Admitting: Obstetrics & Gynecology

## 2015-11-04 ENCOUNTER — Encounter (HOSPITAL_COMMUNITY): Payer: Self-pay

## 2015-11-04 ENCOUNTER — Other Ambulatory Visit (HOSPITAL_COMMUNITY): Payer: Self-pay | Admitting: Maternal and Fetal Medicine

## 2015-11-04 DIAGNOSIS — Z3A33 33 weeks gestation of pregnancy: Secondary | ICD-10-CM | POA: Insufficient documentation

## 2015-11-04 DIAGNOSIS — O24415 Gestational diabetes mellitus in pregnancy, controlled by oral hypoglycemic drugs: Secondary | ICD-10-CM | POA: Diagnosis not present

## 2015-11-04 DIAGNOSIS — O403XX0 Polyhydramnios, third trimester, not applicable or unspecified: Secondary | ICD-10-CM | POA: Diagnosis not present

## 2015-11-04 DIAGNOSIS — O24419 Gestational diabetes mellitus in pregnancy, unspecified control: Secondary | ICD-10-CM

## 2015-11-04 DIAGNOSIS — Z8632 Personal history of gestational diabetes: Secondary | ICD-10-CM

## 2015-11-04 DIAGNOSIS — O09293 Supervision of pregnancy with other poor reproductive or obstetric history, third trimester: Secondary | ICD-10-CM

## 2015-11-04 DIAGNOSIS — O34219 Maternal care for unspecified type scar from previous cesarean delivery: Secondary | ICD-10-CM | POA: Insufficient documentation

## 2015-11-07 ENCOUNTER — Ambulatory Visit (INDEPENDENT_AMBULATORY_CARE_PROVIDER_SITE_OTHER): Payer: Medicaid Other | Admitting: Family Medicine

## 2015-11-07 VITALS — BP 117/62 | HR 69 | Wt 182.0 lb

## 2015-11-07 DIAGNOSIS — O24419 Gestational diabetes mellitus in pregnancy, unspecified control: Secondary | ICD-10-CM | POA: Diagnosis not present

## 2015-11-07 DIAGNOSIS — O409XX0 Polyhydramnios, unspecified trimester, not applicable or unspecified: Secondary | ICD-10-CM | POA: Insufficient documentation

## 2015-11-07 DIAGNOSIS — O09623 Supervision of young multigravida, third trimester: Secondary | ICD-10-CM

## 2015-11-07 DIAGNOSIS — O403XX0 Polyhydramnios, third trimester, not applicable or unspecified: Secondary | ICD-10-CM | POA: Diagnosis not present

## 2015-11-07 LAB — POCT URINALYSIS DIP (DEVICE)
BILIRUBIN URINE: NEGATIVE
Glucose, UA: 500 mg/dL — AB
HGB URINE DIPSTICK: NEGATIVE
Ketones, ur: NEGATIVE mg/dL
NITRITE: NEGATIVE
PH: 5.5 (ref 5.0–8.0)
PROTEIN: 100 mg/dL — AB
Specific Gravity, Urine: 1.025 (ref 1.005–1.030)
Urobilinogen, UA: 0.2 mg/dL (ref 0.0–1.0)

## 2015-11-07 MED ORDER — DOCUSATE SODIUM 100 MG PO CAPS: 100.0000 mg | ORAL_CAPSULE | Freq: Every day | ORAL | Status: DC

## 2015-11-07 MED ORDER — FLUCONAZOLE 150 MG PO TABS: 150.0000 mg | ORAL_TABLET | Freq: Once | ORAL | Status: DC

## 2015-11-07 NOTE — Progress Notes (Signed)
Subjective:  Wanda Harris is a 35 y.o. [id number] at [redacted]w[redacted]d being seen today for ongoing prenatal care.  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  GDM: Patient taking metformin.  Reports no hypoglycemic episodes.  Tolerating medication well Has been having problems with glucometer.   Patient reports no complaints.  Contractions: Not present. Vag. Bleeding: None.  Movement: Present. Denies leaking of fluid.   The following portions of the patient's history were reviewed and updated as appropriate: allergies, current medications, past family history, past medical history, past social history, past surgical history and problem list. Problem list updated.  Objective:   Filed Vitals:   11/07/15 0928  BP: 117/62  Pulse: 69  Weight: 182 lb (82.555 kg)    Fetal Status:     Movement: Present     General:  Alert, oriented and cooperative. Patient is in no acute distress.  Skin: Skin is warm and dry. No rash noted.   Cardiovascular: Normal heart rate noted  Respiratory: Normal respiratory effort, no problems with respiration noted  Abdomen: Soft, gravid, appropriate for gestational age. Pain/Pressure: Present     Pelvic: Vag. Bleeding: None Vag D/C Character: White   Cervical exam deferred        Extremities: Normal range of motion.  Edema: Trace  Mental Status: Normal mood and affect. Normal behavior. Normal judgment and thought content.   Urinalysis: Urine Protein: 2+ Urine Glucose: 3+  Assessment and Plan:  Pregnancy: [id number] at  [redacted]w[redacted]d  1. Gestational diabetes mellitus (GDM) affecting pregnancy NST normal.  Has [place] later this week.  Continue metformin.  Nursing to look at glucometer.  Continue testing - docusate sodium (COLACE) 100 MG capsule; Take 1 capsule (100 mg total) by mouth daily.  Dispense: 30 capsule; Refill: 2 - fluconazole (DIFLUCAN) 150 MG tablet; Take 1 tablet (150 mg total) by mouth once.  Dispense: 1 tablet; Refill: 0 - Fetal nonstress test  2. Supervision of young multigravida, antepartum, third trimester FH  3. Polyhydramnios, antepartum, third trimester, not applicable or unspecified fetus NST twice weekly, weekly AFI.  Preterm labor symptoms and general obstetric precautions including but not limited to vaginal bleeding, contractions, leaking of fluid and fetal movement were reviewed in detail with the patient. Please refer to After Visit Summary for other counseling recommendations.  Return in about 7 days (around 11/14/2015) for ob f/u & nst.   Truett Mainland, DO

## 2015-11-07 NOTE — Progress Notes (Signed)
Patient reports itchy white d/c & burning with urination since completing antibiotics for the past week.

## 2015-11-07 NOTE — Progress Notes (Signed)
BTL consents signed today. Copy given to patient.

## 2015-11-08 ENCOUNTER — Encounter: Payer: Self-pay | Admitting: *Deleted

## 2015-11-09 ENCOUNTER — Encounter (HOSPITAL_COMMUNITY): Payer: Self-pay

## 2015-11-11 ENCOUNTER — Other Ambulatory Visit (HOSPITAL_COMMUNITY): Payer: Self-pay | Admitting: Maternal and Fetal Medicine

## 2015-11-11 ENCOUNTER — Ambulatory Visit (HOSPITAL_COMMUNITY)
Admission: RE | Admit: 2015-11-11 | Discharge: 2015-11-11 | Disposition: A | Discharge status: Home / Self Care | Payer: Medicaid Other | Source: Ambulatory Visit | Attending: Obstetrics & Gynecology | Admitting: Obstetrics & Gynecology

## 2015-11-11 ENCOUNTER — Encounter (HOSPITAL_COMMUNITY): Payer: Self-pay

## 2015-11-11 VITALS — BP 114/62 | HR 74 | Wt 184.0 lb

## 2015-11-11 DIAGNOSIS — O24415 Gestational diabetes mellitus in pregnancy, controlled by oral hypoglycemic drugs: Secondary | ICD-10-CM | POA: Insufficient documentation

## 2015-11-11 DIAGNOSIS — O24419 Gestational diabetes mellitus in pregnancy, unspecified control: Secondary | ICD-10-CM

## 2015-11-11 DIAGNOSIS — O2343 Unspecified infection of urinary tract in pregnancy, third trimester: Secondary | ICD-10-CM

## 2015-11-11 DIAGNOSIS — O34219 Maternal care for unspecified type scar from previous cesarean delivery: Secondary | ICD-10-CM | POA: Insufficient documentation

## 2015-11-11 DIAGNOSIS — O403XX0 Polyhydramnios, third trimester, not applicable or unspecified: Secondary | ICD-10-CM | POA: Diagnosis not present

## 2015-11-11 DIAGNOSIS — Z3A34 34 weeks gestation of pregnancy: Secondary | ICD-10-CM

## 2015-11-11 DIAGNOSIS — O09623 Supervision of young multigravida, third trimester: Secondary | ICD-10-CM

## 2015-11-11 DIAGNOSIS — O09293 Supervision of pregnancy with other poor reproductive or obstetric history, third trimester: Secondary | ICD-10-CM | POA: Insufficient documentation

## 2015-11-11 DIAGNOSIS — O3429 Maternal care due to uterine scar from other previous surgery: Secondary | ICD-10-CM

## 2015-11-11 DIAGNOSIS — B951 Streptococcus, group B, as the cause of diseases classified elsewhere: Secondary | ICD-10-CM

## 2015-11-14 ENCOUNTER — Ambulatory Visit (INDEPENDENT_AMBULATORY_CARE_PROVIDER_SITE_OTHER): Payer: Medicaid Other | Admitting: Obstetrics and Gynecology

## 2015-11-14 ENCOUNTER — Encounter: Payer: Self-pay | Admitting: Obstetrics and Gynecology

## 2015-11-14 VITALS — BP 104/64 | HR 72 | Wt 182.4 lb

## 2015-11-14 DIAGNOSIS — O24419 Gestational diabetes mellitus in pregnancy, unspecified control: Secondary | ICD-10-CM | POA: Diagnosis not present

## 2015-11-14 DIAGNOSIS — O09623 Supervision of young multigravida, third trimester: Secondary | ICD-10-CM | POA: Diagnosis not present

## 2015-11-14 DIAGNOSIS — O2303 Infections of kidney in pregnancy, third trimester: Secondary | ICD-10-CM | POA: Diagnosis not present

## 2015-11-14 DIAGNOSIS — O2343 Unspecified infection of urinary tract in pregnancy, third trimester: Secondary | ICD-10-CM | POA: Diagnosis not present

## 2015-11-14 DIAGNOSIS — O403XX0 Polyhydramnios, third trimester, not applicable or unspecified: Secondary | ICD-10-CM | POA: Diagnosis not present

## 2015-11-14 DIAGNOSIS — B951 Streptococcus, group B, as the cause of diseases classified elsewhere: Secondary | ICD-10-CM

## 2015-11-14 DIAGNOSIS — N12 Tubulo-interstitial nephritis, not specified as acute or chronic: Secondary | ICD-10-CM

## 2015-11-14 DIAGNOSIS — O3429 Maternal care due to uterine scar from other previous surgery: Secondary | ICD-10-CM

## 2015-11-14 DIAGNOSIS — O403XX1 Polyhydramnios, third trimester, fetus 1: Secondary | ICD-10-CM

## 2015-11-14 LAB — POCT URINALYSIS DIP (DEVICE)
Glucose, UA: 100 mg/dL — AB
KETONES UR: NEGATIVE mg/dL
NITRITE: NEGATIVE
Protein, ur: 100 mg/dL — AB
SPECIFIC GRAVITY, URINE: 1.025 (ref 1.005–1.030)
Urobilinogen, UA: 0.2 mg/dL (ref 0.0–1.0)
pH: 5.5 (ref 5.0–8.0)

## 2015-11-14 NOTE — Progress Notes (Signed)
[place] for growth scheduled 6/2.

## 2015-11-14 NOTE — Progress Notes (Signed)
Subjective:  Wanda Harris is a 35 y.o. [id number] at [redacted]w[redacted]d being seen today for ongoing prenatal care.  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  Patient reports no complaints.  Contractions: Irregular. Vag. Bleeding: None.  Movement: Present. Denies leaking of fluid.   The following portions of the patient's history were reviewed and updated as appropriate: allergies, current medications, past family history, past medical history, past social history, past surgical history and problem list. Problem list updated.  Objective:   Filed Vitals:   11/14/15 0910  BP: 104/64  Pulse: 72  Weight: 182 lb 6.4 oz (82.736 kg)    Fetal Status: Fetal Heart Rate (bpm): NST   Movement: Present     General:  Alert, oriented and cooperative. Patient is in no acute distress.  Skin: Skin is warm and dry. No rash noted.   Cardiovascular: Normal heart rate noted  Respiratory: Normal respiratory effort, no problems with respiration noted  Abdomen: Soft, gravid, appropriate for gestational age. Pain/Pressure: Present     Pelvic: Vag. Bleeding: None     Cervical exam deferred        Extremities: Normal range of motion.  Edema: None  Mental Status: Normal mood and affect. Normal behavior. Normal judgment and thought content.   Urinalysis: Urine Protein: 2+ Urine Glucose: 1+  Assessment and Plan:  Pregnancy: [id number] at [redacted]w[redacted]d  1. Gestational diabetes mellitus (GDM) affecting pregnancy Patient did not bring log book. She reports fasting as high as  119 and pp as high as 220. She is not interested in trying glyburide again. Will increase metformin to 1000 mg BID. May need to change to insulin at next visit - Fetal nonstress test  2. Polyhydramnios, antepartum, third trimester, fetus 1 Follow up growth ultrasound on 6/2 - Fetal nonstress test- reviewed and reactive   3. 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 GBS (group B streptococcus) UTI complicating pregnancy, third trimester Will need prophylaxis in labor  5. Supervision of young multigravida, antepartum, third trimester     Preterm labor symptoms and general obstetric precautions including but not limited to vaginal bleeding, contractions, leaking of fluid and fetal movement were reviewed in detail with the patient. Please refer to After Visit Summary for other counseling recommendations.  Return in about 1 week (around 11/21/2015).   Mora Bellman, MD

## 2015-11-18 ENCOUNTER — Encounter (HOSPITAL_COMMUNITY): Payer: Self-pay

## 2015-11-18 ENCOUNTER — Encounter (HOSPITAL_COMMUNITY): Payer: Self-pay | Admitting: *Deleted

## 2015-11-18 ENCOUNTER — Inpatient Hospital Stay (HOSPITAL_COMMUNITY)
Admission: AD | Admit: 2015-11-18 | Discharge: 2015-11-18 | Disposition: A | Discharge status: Home / Self Care | Payer: Medicaid Other | Source: Ambulatory Visit | Attending: Family Medicine | Admitting: Family Medicine

## 2015-11-18 ENCOUNTER — Ambulatory Visit (HOSPITAL_COMMUNITY)
Admission: RE | Admit: 2015-11-18 | Discharge: 2015-11-18 | Disposition: A | Discharge status: Home / Self Care | Payer: Medicaid Other | Source: Ambulatory Visit | Attending: Obstetrics & Gynecology | Admitting: Obstetrics & Gynecology

## 2015-11-18 VITALS — BP 118/63 | HR 71 | Wt 187.2 lb

## 2015-11-18 DIAGNOSIS — Z7984 Long term (current) use of oral hypoglycemic drugs: Secondary | ICD-10-CM | POA: Insufficient documentation

## 2015-11-18 DIAGNOSIS — O289 Unspecified abnormal findings on antenatal screening of mother: Secondary | ICD-10-CM | POA: Insufficient documentation

## 2015-11-18 DIAGNOSIS — O2340 Unspecified infection of urinary tract in pregnancy, unspecified trimester: Secondary | ICD-10-CM

## 2015-11-18 DIAGNOSIS — B951 Streptococcus, group B, as the cause of diseases classified elsewhere: Secondary | ICD-10-CM

## 2015-11-18 DIAGNOSIS — O24414 Gestational diabetes mellitus in pregnancy, insulin controlled: Secondary | ICD-10-CM | POA: Diagnosis not present

## 2015-11-18 DIAGNOSIS — IMO0002 Reserved for concepts with insufficient information to code with codable children: Secondary | ICD-10-CM

## 2015-11-18 DIAGNOSIS — Z3A35 35 weeks gestation of pregnancy: Secondary | ICD-10-CM | POA: Insufficient documentation

## 2015-11-18 DIAGNOSIS — O288 Other abnormal findings on antenatal screening of mother: Secondary | ICD-10-CM

## 2015-11-18 DIAGNOSIS — Z87891 Personal history of nicotine dependence: Secondary | ICD-10-CM | POA: Insufficient documentation

## 2015-11-18 DIAGNOSIS — O3429 Maternal care due to uterine scar from other previous surgery: Secondary | ICD-10-CM

## 2015-11-18 DIAGNOSIS — O24419 Gestational diabetes mellitus in pregnancy, unspecified control: Secondary | ICD-10-CM

## 2015-11-18 DIAGNOSIS — O09629 Supervision of young multigravida, unspecified trimester: Secondary | ICD-10-CM

## 2015-11-18 NOTE — Discharge Instructions (Signed)
Fetal Movement Counts  Patient Name: __________________________________________________ Patient Due Date: ____________________  Performing a fetal movement count is highly recommended in high-risk pregnancies, but it is good for every pregnant woman to do. Your health care provider may ask you to start counting fetal movements at 28 weeks of the pregnancy. Fetal movements often increase:  · After eating a full meal.  · After physical activity.  · After eating or drinking something sweet or cold.  · At rest.  Pay attention to when you feel the baby is most active. This will help you notice a pattern of your baby's sleep and wake cycles and what factors contribute to an increase in fetal movement. It is important to perform a fetal movement count at the same time each day when your baby is normally most active.   HOW TO COUNT FETAL MOVEMENTS  1. Find a quiet and comfortable area to sit or lie down on your left side. Lying on your left side provides the best blood and oxygen circulation to your baby.  2. Write down the day and time on a sheet of paper or in a journal.  3. Start counting kicks, flutters, swishes, rolls, or jabs in a 2-hour period. You should feel at least 10 movements within 2 hours.  4. If you do not feel 10 movements in 2 hours, wait 2-3 hours and count again. Look for a change in the pattern or not enough counts in 2 hours.  SEEK MEDICAL CARE IF:  · You feel less than 10 counts in 2 hours, tried twice.  · There is no movement in over an hour.  · The pattern is changing or taking longer each day to reach 10 counts in 2 hours.  · You feel the baby is not moving as he or she usually does.  Date: ____________ Movements: ____________ Start time: ____________ Finish time: ____________   Date: ____________ Movements: ____________ Start time: ____________ Finish time: ____________  Date: ____________ Movements: ____________ Start time: ____________ Finish time: ____________  Date: ____________ Movements:  ____________ Start time: ____________ Finish time: ____________  Date: ____________ Movements: ____________ Start time: ____________ Finish time: ____________  Date: ____________ Movements: ____________ Start time: ____________ Finish time: ____________  Date: ____________ Movements: ____________ Start time: ____________ Finish time: ____________  Date: ____________ Movements: ____________ Start time: ____________ Finish time: ____________   Date: ____________ Movements: ____________ Start time: ____________ Finish time: ____________  Date: ____________ Movements: ____________ Start time: ____________ Finish time: ____________  Date: ____________ Movements: ____________ Start time: ____________ Finish time: ____________  Date: ____________ Movements: ____________ Start time: ____________ Finish time: ____________  Date: ____________ Movements: ____________ Start time: ____________ Finish time: ____________  Date: ____________ Movements: ____________ Start time: ____________ Finish time: ____________  Date: ____________ Movements: ____________ Start time: ____________ Finish time: ____________   Date: ____________ Movements: ____________ Start time: ____________ Finish time: ____________  Date: ____________ Movements: ____________ Start time: ____________ Finish time: ____________  Date: ____________ Movements: ____________ Start time: ____________ Finish time: ____________  Date: ____________ Movements: ____________ Start time: ____________ Finish time: ____________  Date: ____________ Movements: ____________ Start time: ____________ Finish time: ____________  Date: ____________ Movements: ____________ Start time: ____________ Finish time: ____________  Date: ____________ Movements: ____________ Start time: ____________ Finish time: ____________   Date: ____________ Movements: ____________ Start time: ____________ Finish time: ____________  Date: ____________ Movements: ____________ Start time: ____________ Finish  time: ____________  Date: ____________ Movements: ____________ Start time: ____________ Finish time: ____________  Date: ____________ Movements: ____________ Start time:   ____________ Finish time: ____________  Date: ____________ Movements: ____________ Start time: ____________ Finish time: ____________  Date: ____________ Movements: ____________ Start time: ____________ Finish time: ____________  Date: ____________ Movements: ____________ Start time: ____________ Finish time: ____________   Date: ____________ Movements: ____________ Start time: ____________ Finish time: ____________  Date: ____________ Movements: ____________ Start time: ____________ Finish time: ____________  Date: ____________ Movements: ____________ Start time: ____________ Finish time: ____________  Date: ____________ Movements: ____________ Start time: ____________ Finish time: ____________  Date: ____________ Movements: ____________ Start time: ____________ Finish time: ____________  Date: ____________ Movements: ____________ Start time: ____________ Finish time: ____________  Date: ____________ Movements: ____________ Start time: ____________ Finish time: ____________   Date: ____________ Movements: ____________ Start time: ____________ Finish time: ____________  Date: ____________ Movements: ____________ Start time: ____________ Finish time: ____________  Date: ____________ Movements: ____________ Start time: ____________ Finish time: ____________  Date: ____________ Movements: ____________ Start time: ____________ Finish time: ____________  Date: ____________ Movements: ____________ Start time: ____________ Finish time: ____________  Date: ____________ Movements: ____________ Start time: ____________ Finish time: ____________  Date: ____________ Movements: ____________ Start time: ____________ Finish time: ____________   Date: ____________ Movements: ____________ Start time: ____________ Finish time: ____________  Date: ____________  Movements: ____________ Start time: ____________ Finish time: ____________  Date: ____________ Movements: ____________ Start time: ____________ Finish time: ____________  Date: ____________ Movements: ____________ Start time: ____________ Finish time: ____________  Date: ____________ Movements: ____________ Start time: ____________ Finish time: ____________  Date: ____________ Movements: ____________ Start time: ____________ Finish time: ____________  Date: ____________ Movements: ____________ Start time: ____________ Finish time: ____________   Date: ____________ Movements: ____________ Start time: ____________ Finish time: ____________  Date: ____________ Movements: ____________ Start time: ____________ Finish time: ____________  Date: ____________ Movements: ____________ Start time: ____________ Finish time: ____________  Date: ____________ Movements: ____________ Start time: ____________ Finish time: ____________  Date: ____________ Movements: ____________ Start time: ____________ Finish time: ____________  Date: ____________ Movements: ____________ Start time: ____________ Finish time: ____________     This information is not intended to replace advice given to you by your health care provider. Make sure you discuss any questions you have with your health care provider.     Document Released: 07/11/2006 Document Revised: 07/02/2014 Document Reviewed: 04/07/2012  Elsevier Interactive Patient Education ©2016 [employer]

## 2015-11-18 NOTE — MAU Provider Note (Signed)
History     CSN: [id number]  Arrival date and time: 11/18/15 1715    First Provider Initiated Contact with Patient 11/18/15 1837        Chief Complaint  Patient presents with  . Non-stress Test   HPI Wanda Harris is a 35 y.o. [id number] at [redacted]w[redacted]d who presents sent from MFM for prolonged fetal monitoring. Pt seen today by MFM for BPP & NST. Per reports, pt received 10/10 but had a fetal decel. Patient denies abdominal pain, VB, or LOF. Positive fetal movement.   OB History    Gravida Para Term Preterm AB TAB SAB Ectopic Multiple Living   6 4 4  1  0 1   4      Past Medical History  Diagnosis Date  . Diabetes mellitus without complication (HCC)     GDM  . Gestational diabetes     G2 & G5, takes insulin  . Asthma     does not use inhaler    Past Surgical History  Procedure Laterality Date  . Cesarean section N/A 07/01/2013    Procedure: Primary Cesarean Section Delivery Baby Boy @ 2038, Apgars 7/9;  Surgeon: Jonnie Kind, MD;  Location: [facility];  Service: Obstetrics;  Laterality: N/A;    Family History  Problem Relation Age of Onset  . Diabetes Mother   . Hypertension Mother   . Arthritis Mother   . Hyperlipidemia Father   . Heart disease Father   . Stroke Father     Social History  Substance Use Topics  . Smoking status: Former Smoker    Quit date: 05/02/2015  . Smokeless tobacco: Never Used  . Alcohol Use: No    Allergies: No Known Allergies  Prescriptions prior to admission  Medication Sig Dispense Refill Last Dose  . docusate sodium (COLACE) 100 MG capsule Take 1 capsule (100 mg total) by mouth daily. 30 capsule 2 11/17/2015 at Unknown time  . metFORMIN (GLUCOPHAGE) 500 MG tablet Take 1 tablet (500 mg total) by mouth 2 (two) times daily with a meal. 60 tablet 1 11/18/2015 at Unknown time  . Prenatal Vit-Fe Fumarate-FA (MULTIVITAMIN-PRENATAL) 27-0.8 MG TABS tablet Take 1 tablet by mouth daily at 12 noon.   11/17/2015 at Unknown time  . ACCU-CHEK FASTCLIX  LANCETS MISC USE 1 UTD QID  12   . ACCU-CHEK SMARTVIEW test strip USE TO CHECK BLOOD SUGAR QID  11   . glyBURIDE (DIABETA) 2.5 MG tablet TK 1 T PO QAM AND 2 QHS  8   . oxyCODONE-acetaminophen (PERCOCET/ROXICET) 5-325 MG tablet Take 1 tablet by mouth every 4 (four) hours as needed. (Patient not taking: Reported on 11/14/2015) 15 tablet 0 Not Taking at Unknown time    Review of Systems  Constitutional: Negative.   Gastrointestinal: Negative.   Genitourinary: Negative.    Physical Exam   Blood pressure 111/63, pulse 73, temperature 98.2 F (36.8 C), temperature source Oral, resp. rate 20, height 4\' 11"  (1.499 m), weight 186 lb 9.6 oz (84.641 kg), last menstrual period 03/14/2015, unknown if currently breastfeeding.  Physical Exam  Nursing note and vitals reviewed. Constitutional: She is oriented to person, place, and time. She appears well-developed and well-nourished. No distress.  HENT:  Head: Normocephalic and atraumatic.  Eyes: Conjunctivae are normal. Right eye exhibits no discharge. Left eye exhibits no discharge. No scleral icterus.  Neck: Normal range of motion.  Cardiovascular: Normal rate, regular rhythm and normal heart sounds.   No murmur heard. Respiratory: Effort normal and  breath sounds normal. No respiratory distress. She has no wheezes.  GI: Soft. There is no tenderness.  Neurological: She is alert and oriented to person, place, and time.  Skin: Skin is warm and dry. She is not diaphoretic.  Psychiatric: She has a normal mood and affect. Her behavior is normal. Judgment and thought content normal.   Fetal Tracing:  Baseline: 150 Variability: moderate Accelerations: 15x15 Decelerations: none  Toco: x 1 & irregular UI   MAU Course  Procedures  MDM Reactive tracing S/w Dr. Kennon Rounds. Ok to discharge home  Assessment and Plan  A: 1. Fetal heart deceleration     P: Discharge home Fetal kick counts Return for appts as scheduled Discussed reasons to return to  [place] 11/18/2015, 6:37 PM

## 2015-11-18 NOTE — MAU Note (Signed)
Pt sent from the office for prolonged fetal monitoring. Pt denies ctxs, LOF, or vaginal bleeding. Fetus active.

## 2015-11-22 ENCOUNTER — Other Ambulatory Visit: Payer: Medicaid Other

## 2015-11-25 ENCOUNTER — Other Ambulatory Visit (HOSPITAL_COMMUNITY): Payer: Self-pay | Admitting: Maternal and Fetal Medicine

## 2015-11-25 ENCOUNTER — Ambulatory Visit (HOSPITAL_COMMUNITY)
Admission: RE | Admit: 2015-11-25 | Discharge: 2015-11-25 | Disposition: A | Discharge status: Home / Self Care | Payer: Medicaid Other | Source: Ambulatory Visit | Attending: Obstetrics & Gynecology | Admitting: Obstetrics & Gynecology

## 2015-11-25 ENCOUNTER — Encounter (HOSPITAL_COMMUNITY): Payer: Self-pay

## 2015-11-25 VITALS — BP 121/75 | HR 79 | Wt 187.4 lb

## 2015-11-25 DIAGNOSIS — O2340 Unspecified infection of urinary tract in pregnancy, unspecified trimester: Secondary | ICD-10-CM

## 2015-11-25 DIAGNOSIS — Z3A36 36 weeks gestation of pregnancy: Secondary | ICD-10-CM | POA: Insufficient documentation

## 2015-11-25 DIAGNOSIS — O403XX0 Polyhydramnios, third trimester, not applicable or unspecified: Secondary | ICD-10-CM

## 2015-11-25 DIAGNOSIS — O09629 Supervision of young multigravida, unspecified trimester: Secondary | ICD-10-CM

## 2015-11-25 DIAGNOSIS — O24419 Gestational diabetes mellitus in pregnancy, unspecified control: Secondary | ICD-10-CM

## 2015-11-25 DIAGNOSIS — A5901 Trichomonal vulvovaginitis: Secondary | ICD-10-CM

## 2015-11-25 DIAGNOSIS — O09293 Supervision of pregnancy with other poor reproductive or obstetric history, third trimester: Secondary | ICD-10-CM | POA: Insufficient documentation

## 2015-11-25 DIAGNOSIS — O3429 Maternal care due to uterine scar from other previous surgery: Secondary | ICD-10-CM

## 2015-11-25 DIAGNOSIS — B951 Streptococcus, group B, as the cause of diseases classified elsewhere: Secondary | ICD-10-CM

## 2015-11-25 DIAGNOSIS — O23599 Infection of other part of genital tract in pregnancy, unspecified trimester: Secondary | ICD-10-CM

## 2015-11-25 DIAGNOSIS — O24415 Gestational diabetes mellitus in pregnancy, controlled by oral hypoglycemic drugs: Secondary | ICD-10-CM | POA: Insufficient documentation

## 2015-11-28 ENCOUNTER — Encounter (HOSPITAL_COMMUNITY): Payer: Self-pay | Admitting: *Deleted

## 2015-11-28 ENCOUNTER — Other Ambulatory Visit: Payer: Self-pay | Admitting: Obstetrics and Gynecology

## 2015-11-28 ENCOUNTER — Inpatient Hospital Stay (HOSPITAL_COMMUNITY)
Admission: AD | Admit: 2015-11-28 | Discharge: 2015-11-30 | DRG: 765 | Disposition: A | Discharge status: Home / Self Care | Payer: Medicaid Other | Source: Ambulatory Visit | Attending: Obstetrics & Gynecology | Admitting: Obstetrics & Gynecology

## 2015-11-28 ENCOUNTER — Encounter: Payer: Self-pay | Admitting: Obstetrics and Gynecology

## 2015-11-28 ENCOUNTER — Other Ambulatory Visit (HOSPITAL_COMMUNITY)
Admission: RE | Admit: 2015-11-28 | Discharge: 2015-11-28 | Disposition: A | Discharge status: Home / Self Care | Payer: Medicaid Other | Source: Ambulatory Visit | Attending: Family Medicine | Admitting: Family Medicine

## 2015-11-28 ENCOUNTER — Inpatient Hospital Stay (HOSPITAL_COMMUNITY): Payer: Medicaid Other | Admitting: Anesthesiology

## 2015-11-28 ENCOUNTER — Encounter (HOSPITAL_COMMUNITY)
Admission: AD | Disposition: A | Discharge status: Home / Self Care | Payer: Self-pay | Source: Ambulatory Visit | Attending: Obstetrics & Gynecology

## 2015-11-28 ENCOUNTER — Ambulatory Visit (INDEPENDENT_AMBULATORY_CARE_PROVIDER_SITE_OTHER): Payer: Medicaid Other | Admitting: Obstetrics and Gynecology

## 2015-11-28 ENCOUNTER — Ambulatory Visit (HOSPITAL_COMMUNITY)
Admission: RE | Admit: 2015-11-28 | Discharge: 2015-11-28 | Disposition: A | Discharge status: Home / Self Care | Payer: Medicaid Other | Source: Ambulatory Visit | Attending: Obstetrics and Gynecology | Admitting: Obstetrics and Gynecology

## 2015-11-28 VITALS — BP 114/64 | HR 77 | Wt 189.2 lb

## 2015-11-28 DIAGNOSIS — O34219 Maternal care for unspecified type scar from previous cesarean delivery: Secondary | ICD-10-CM

## 2015-11-28 DIAGNOSIS — O403XX0 Polyhydramnios, third trimester, not applicable or unspecified: Secondary | ICD-10-CM

## 2015-11-28 DIAGNOSIS — O24419 Gestational diabetes mellitus in pregnancy, unspecified control: Secondary | ICD-10-CM

## 2015-11-28 DIAGNOSIS — O2303 Infections of kidney in pregnancy, third trimester: Secondary | ICD-10-CM | POA: Diagnosis present

## 2015-11-28 DIAGNOSIS — Z3A37 37 weeks gestation of pregnancy: Secondary | ICD-10-CM

## 2015-11-28 DIAGNOSIS — O3660X0 Maternal care for excessive fetal growth, unspecified trimester, not applicable or unspecified: Secondary | ICD-10-CM | POA: Diagnosis not present

## 2015-11-28 DIAGNOSIS — Z98891 History of uterine scar from previous surgery: Secondary | ICD-10-CM

## 2015-11-28 DIAGNOSIS — O3429 Maternal care due to uterine scar from other previous surgery: Secondary | ICD-10-CM | POA: Diagnosis not present

## 2015-11-28 DIAGNOSIS — Z302 Encounter for sterilization: Secondary | ICD-10-CM

## 2015-11-28 DIAGNOSIS — O99824 Streptococcus B carrier state complicating childbirth: Secondary | ICD-10-CM | POA: Diagnosis present

## 2015-11-28 DIAGNOSIS — Z8249 Family history of ischemic heart disease and other diseases of the circulatory system: Secondary | ICD-10-CM

## 2015-11-28 DIAGNOSIS — Z87891 Personal history of nicotine dependence: Secondary | ICD-10-CM | POA: Diagnosis not present

## 2015-11-28 DIAGNOSIS — O09629 Supervision of young multigravida, unspecified trimester: Secondary | ICD-10-CM

## 2015-11-28 DIAGNOSIS — O289 Unspecified abnormal findings on antenatal screening of mother: Secondary | ICD-10-CM

## 2015-11-28 DIAGNOSIS — O24415 Gestational diabetes mellitus in pregnancy, controlled by oral hypoglycemic drugs: Secondary | ICD-10-CM

## 2015-11-28 DIAGNOSIS — B951 Streptococcus, group B, as the cause of diseases classified elsewhere: Secondary | ICD-10-CM

## 2015-11-28 DIAGNOSIS — O09293 Supervision of pregnancy with other poor reproductive or obstetric history, third trimester: Secondary | ICD-10-CM

## 2015-11-28 DIAGNOSIS — Z833 Family history of diabetes mellitus: Secondary | ICD-10-CM

## 2015-11-28 DIAGNOSIS — O288 Other abnormal findings on antenatal screening of mother: Secondary | ICD-10-CM

## 2015-11-28 DIAGNOSIS — O34211 Maternal care for low transverse scar from previous cesarean delivery: Secondary | ICD-10-CM | POA: Diagnosis present

## 2015-11-28 DIAGNOSIS — O2340 Unspecified infection of urinary tract in pregnancy, unspecified trimester: Secondary | ICD-10-CM

## 2015-11-28 DIAGNOSIS — O2343 Unspecified infection of urinary tract in pregnancy, third trimester: Secondary | ICD-10-CM

## 2015-11-28 DIAGNOSIS — Z823 Family history of stroke: Secondary | ICD-10-CM

## 2015-11-28 DIAGNOSIS — Z8261 Family history of arthritis: Secondary | ICD-10-CM | POA: Diagnosis not present

## 2015-11-28 DIAGNOSIS — O368130 Decreased fetal movements, third trimester, not applicable or unspecified: Principal | ICD-10-CM | POA: Diagnosis present

## 2015-11-28 DIAGNOSIS — Z8632 Personal history of gestational diabetes: Secondary | ICD-10-CM

## 2015-11-28 DIAGNOSIS — O24425 Gestational diabetes mellitus in childbirth, controlled by oral hypoglycemic drugs: Secondary | ICD-10-CM | POA: Diagnosis present

## 2015-11-28 DIAGNOSIS — O23599 Infection of other part of genital tract in pregnancy, unspecified trimester: Secondary | ICD-10-CM

## 2015-11-28 DIAGNOSIS — N12 Tubulo-interstitial nephritis, not specified as acute or chronic: Secondary | ICD-10-CM | POA: Diagnosis not present

## 2015-11-28 DIAGNOSIS — O09523 Supervision of elderly multigravida, third trimester: Secondary | ICD-10-CM

## 2015-11-28 DIAGNOSIS — Z113 Encounter for screening for infections with a predominantly sexual mode of transmission: Secondary | ICD-10-CM | POA: Insufficient documentation

## 2015-11-28 DIAGNOSIS — O3663X0 Maternal care for excessive fetal growth, third trimester, not applicable or unspecified: Secondary | ICD-10-CM | POA: Diagnosis present

## 2015-11-28 DIAGNOSIS — A5901 Trichomonal vulvovaginitis: Secondary | ICD-10-CM | POA: Diagnosis present

## 2015-11-28 LAB — CBC
HEMATOCRIT: 32.3 % — AB (ref 36.0–46.0)
Hemoglobin: 10.3 g/dL — ABNORMAL LOW (ref 12.0–15.0)
MCH: 22.6 pg — AB (ref 26.0–34.0)
MCHC: 31.9 g/dL (ref 30.0–36.0)
MCV: 70.8 fL — ABNORMAL LOW (ref 78.0–100.0)
PLATELETS: 242 10*3/uL (ref 150–400)
RBC: 4.56 MIL/uL (ref 3.87–5.11)
RDW: 15.7 % — ABNORMAL HIGH (ref 11.5–15.5)
WBC: 8.4 10*3/uL (ref 4.0–10.5)

## 2015-11-28 LAB — POCT URINALYSIS DIP (DEVICE)
BILIRUBIN URINE: NEGATIVE
Glucose, UA: >=1000 mg/dL — AB
HGB URINE DIPSTICK: NEGATIVE
Ketones, ur: NEGATIVE mg/dL
Leukocytes, UA: NEGATIVE
NITRITE: NEGATIVE
PH: 5 (ref 5.0–8.0)
PROTEIN: NEGATIVE mg/dL
Specific Gravity, Urine: <=1.005 (ref 1.005–1.030)
UROBILINOGEN UA: 0.2 mg/dL (ref 0.0–1.0)

## 2015-11-28 LAB — GLUCOSE, CAPILLARY
GLUCOSE-CAPILLARY: 105 mg/dL — AB (ref 65–99)
GLUCOSE-CAPILLARY: 87 mg/dL (ref 65–99)
Glucose-Capillary: 158 mg/dL — ABNORMAL HIGH (ref 65–99)

## 2015-11-28 LAB — TYPE AND SCREEN
ABO/RH(D): O POS
ANTIBODY SCREEN: NEGATIVE

## 2015-11-28 SURGERY — Surgical Case
Anesthesia: Spinal | Site: Abdomen | Laterality: Bilateral

## 2015-11-28 MED ORDER — DIBUCAINE 1 % RE OINT: 1.0000 "application " | TOPICAL_OINTMENT | RECTAL | Status: DC | PRN

## 2015-11-28 MED ORDER — MEPERIDINE HCL 25 MG/ML IJ SOLN: 6.2500 mg | INTRAMUSCULAR | Status: DC | PRN

## 2015-11-28 MED ORDER — ACETAMINOPHEN 325 MG PO TABS
650.0000 mg | ORAL_TABLET | ORAL | Status: DC | PRN
  Administered 2015-11-29: 650 mg via ORAL
  Filled 2015-11-28: qty 2

## 2015-11-28 MED ORDER — LACTATED RINGERS IV SOLN
INTRAVENOUS | Status: DC | PRN
  Administered 2015-11-28 (×3): via INTRAVENOUS

## 2015-11-28 MED ORDER — PROMETHAZINE HCL 25 MG/ML IJ SOLN: 6.2500 mg | INTRAMUSCULAR | Status: DC | PRN

## 2015-11-28 MED ORDER — GLYCOPYRROLATE 0.2 MG/ML IJ SOLN
INTRAMUSCULAR | Status: DC | PRN
  Administered 2015-11-28 (×2): 0.1 mg via INTRAVENOUS

## 2015-11-28 MED ORDER — OXYCODONE-ACETAMINOPHEN 5-325 MG PO TABS: 2.0000 | ORAL_TABLET | ORAL | Status: DC | PRN

## 2015-11-28 MED ORDER — NALBUPHINE HCL 10 MG/ML IJ SOLN
INTRAMUSCULAR | Status: AC
  Administered 2015-11-28: 5 mg via INTRAVENOUS
  Filled 2015-11-28: qty 1

## 2015-11-28 MED ORDER — FENTANYL CITRATE (PF) 100 MCG/2ML IJ SOLN: 25.0000 ug | INTRAMUSCULAR | Status: DC | PRN

## 2015-11-28 MED ORDER — FENTANYL CITRATE (PF) 100 MCG/2ML IJ SOLN
INTRAMUSCULAR | Status: AC
  Filled 2015-11-28: qty 2

## 2015-11-28 MED ORDER — DIPHENHYDRAMINE HCL 50 MG/ML IJ SOLN: 12.5000 mg | INTRAMUSCULAR | Status: DC | PRN

## 2015-11-28 MED ORDER — FENTANYL CITRATE (PF) 100 MCG/2ML IJ SOLN
INTRAMUSCULAR | Status: DC | PRN
  Administered 2015-11-28: 10 ug via INTRATHECAL

## 2015-11-28 MED ORDER — NALBUPHINE HCL 10 MG/ML IJ SOLN
5.0000 mg | Freq: Once | INTRAMUSCULAR | Status: AC | PRN
  Administered 2015-11-28: 5 mg via INTRAVENOUS

## 2015-11-28 MED ORDER — BUPIVACAINE IN DEXTROSE 0.75-8.25 % IT SOLN
INTRATHECAL | Status: DC | PRN
Start: 2015-11-28 — End: 2015-11-28
  Administered 2015-11-28: 1.6 mL via INTRATHECAL

## 2015-11-28 MED ORDER — OXYTOCIN 40 UNITS IN LACTATED RINGERS INFUSION - SIMPLE MED: 2.5000 [IU]/h | INTRAVENOUS | Status: AC

## 2015-11-28 MED ORDER — NALBUPHINE HCL 10 MG/ML IJ SOLN: 5.0000 mg | Freq: Once | INTRAMUSCULAR | Status: AC | PRN

## 2015-11-28 MED ORDER — SOD CITRATE-CITRIC ACID 500-334 MG/5ML PO SOLN
30.0000 mL | ORAL | Status: AC
  Administered 2015-11-28: 30 mL via ORAL

## 2015-11-28 MED ORDER — KETOROLAC TROMETHAMINE 30 MG/ML IJ SOLN: 30.0000 mg | Freq: Four times a day (QID) | INTRAMUSCULAR | Status: DC | PRN

## 2015-11-28 MED ORDER — DIPHENHYDRAMINE HCL 25 MG PO CAPS
25.0000 mg | ORAL_CAPSULE | ORAL | Status: DC | PRN
  Filled 2015-11-28: qty 1

## 2015-11-28 MED ORDER — GLYCOPYRROLATE 0.2 MG/ML IJ SOLN
INTRAMUSCULAR | Status: AC
  Filled 2015-11-28: qty 1

## 2015-11-28 MED ORDER — SENNOSIDES-DOCUSATE SODIUM 8.6-50 MG PO TABS
2.0000 | ORAL_TABLET | ORAL | Status: DC
  Administered 2015-11-28 – 2015-11-29 (×2): 2 via ORAL
  Filled 2015-11-28 (×2): qty 2

## 2015-11-28 MED ORDER — SIMETHICONE 80 MG PO CHEW: 80.0000 mg | CHEWABLE_TABLET | ORAL | Status: DC | PRN

## 2015-11-28 MED ORDER — ONDANSETRON HCL 4 MG/2ML IJ SOLN
INTRAMUSCULAR | Status: AC
  Filled 2015-11-28: qty 2

## 2015-11-28 MED ORDER — CEFAZOLIN SODIUM-DEXTROSE 2-4 GM/100ML-% IV SOLN
2.0000 g | INTRAVENOUS | Status: AC
  Administered 2015-11-28: 2 g via INTRAVENOUS

## 2015-11-28 MED ORDER — SODIUM CHLORIDE 0.9 % IR SOLN
Status: DC | PRN
  Administered 2015-11-28: 1000 mL

## 2015-11-28 MED ORDER — KETOROLAC TROMETHAMINE 30 MG/ML IJ SOLN
INTRAMUSCULAR | Status: AC
  Administered 2015-11-28: 30 mg via INTRAMUSCULAR
  Filled 2015-11-28: qty 1

## 2015-11-28 MED ORDER — KETOROLAC TROMETHAMINE 30 MG/ML IJ SOLN
30.0000 mg | Freq: Four times a day (QID) | INTRAMUSCULAR | Status: DC | PRN
  Administered 2015-11-28: 30 mg via INTRAMUSCULAR

## 2015-11-28 MED ORDER — SIMETHICONE 80 MG PO CHEW
80.0000 mg | CHEWABLE_TABLET | ORAL | Status: DC
  Administered 2015-11-28 – 2015-11-29 (×2): 80 mg via ORAL
  Filled 2015-11-28 (×2): qty 1

## 2015-11-28 MED ORDER — PHENYLEPHRINE 8 MG IN D5W 100 ML (0.08MG/ML) PREMIX OPTIME
INJECTION | INTRAVENOUS | Status: AC
  Filled 2015-11-28: qty 100

## 2015-11-28 MED ORDER — NALBUPHINE HCL 10 MG/ML IJ SOLN: 5.0000 mg | INTRAMUSCULAR | Status: DC | PRN

## 2015-11-28 MED ORDER — CEFAZOLIN SODIUM-DEXTROSE 2-4 GM/100ML-% IV SOLN
INTRAVENOUS | Status: AC
  Filled 2015-11-28: qty 100

## 2015-11-28 MED ORDER — MORPHINE SULFATE (PF) 0.5 MG/ML IJ SOLN
INTRAMUSCULAR | Status: AC
  Filled 2015-11-28: qty 10

## 2015-11-28 MED ORDER — PHENYLEPHRINE 8 MG IN D5W 100 ML (0.08MG/ML) PREMIX OPTIME
INJECTION | INTRAVENOUS | Status: DC | PRN
Start: 2015-11-28 — End: 2015-11-28
  Administered 2015-11-28: 60 ug/min via INTRAVENOUS

## 2015-11-28 MED ORDER — NALOXONE HCL 0.4 MG/ML IJ SOLN: 0.4000 mg | INTRAMUSCULAR | Status: DC | PRN

## 2015-11-28 MED ORDER — WITCH HAZEL-GLYCERIN EX PADS: 1.0000 "application " | MEDICATED_PAD | CUTANEOUS | Status: DC | PRN

## 2015-11-28 MED ORDER — MORPHINE SULFATE (PF) 0.5 MG/ML IJ SOLN
INTRAMUSCULAR | Status: DC | PRN
  Administered 2015-11-28: .2 mg via INTRATHECAL

## 2015-11-28 MED ORDER — OXYCODONE-ACETAMINOPHEN 5-325 MG PO TABS
1.0000 | ORAL_TABLET | ORAL | Status: DC | PRN
  Administered 2015-11-29 – 2015-11-30 (×3): 1 via ORAL
  Filled 2015-11-28 (×4): qty 1

## 2015-11-28 MED ORDER — SIMETHICONE 80 MG PO CHEW
80.0000 mg | CHEWABLE_TABLET | Freq: Three times a day (TID) | ORAL | Status: DC
  Administered 2015-11-29 – 2015-11-30 (×4): 80 mg via ORAL
  Filled 2015-11-28 (×6): qty 1

## 2015-11-28 MED ORDER — OXYTOCIN 40 UNITS IN LACTATED RINGERS INFUSION - SIMPLE MED
INTRAVENOUS | Status: DC | PRN
  Administered 2015-11-28 (×2): 100 mL via INTRAVENOUS
  Administered 2015-11-28: 200 mL via INTRAVENOUS
  Administered 2015-11-28: 300 mL via INTRAVENOUS
  Administered 2015-11-28: 200 mL via INTRAVENOUS

## 2015-11-28 MED ORDER — BUPIVACAINE HCL (PF) 0.5 % IJ SOLN
INTRAMUSCULAR | Status: DC | PRN
  Administered 2015-11-28: 3 mL

## 2015-11-28 MED ORDER — ONDANSETRON HCL 4 MG/2ML IJ SOLN
INTRAMUSCULAR | Status: DC | PRN
  Administered 2015-11-28 (×2): 2 mg via INTRAVENOUS

## 2015-11-28 MED ORDER — METFORMIN HCL 500 MG PO TABS
1000.0000 mg | ORAL_TABLET | Freq: Two times a day (BID) | ORAL | Status: DC
  Administered 2015-11-29 – 2015-11-30 (×3): 1000 mg via ORAL
  Filled 2015-11-28 (×4): qty 2

## 2015-11-28 MED ORDER — IBUPROFEN 600 MG PO TABS
600.0000 mg | ORAL_TABLET | Freq: Four times a day (QID) | ORAL | Status: DC
  Administered 2015-11-28 – 2015-11-30 (×6): 600 mg via ORAL
  Filled 2015-11-28 (×6): qty 1

## 2015-11-28 MED ORDER — LACTATED RINGERS IV SOLN: INTRAVENOUS | Status: DC

## 2015-11-28 MED ORDER — OXYTOCIN 10 UNIT/ML IJ SOLN: 40.0000 [IU] | INTRAVENOUS | Status: DC | PRN

## 2015-11-28 MED ORDER — COCONUT OIL OIL: 1.0000 "application " | TOPICAL_OIL | Status: DC | PRN

## 2015-11-28 MED ORDER — SOD CITRATE-CITRIC ACID 500-334 MG/5ML PO SOLN
ORAL | Status: AC
  Administered 2015-11-28: 30 mL via ORAL
  Filled 2015-11-28: qty 15

## 2015-11-28 MED ORDER — SODIUM CHLORIDE 0.9% FLUSH: 3.0000 mL | INTRAVENOUS | Status: DC | PRN

## 2015-11-28 MED ORDER — NALOXONE HCL 2 MG/2ML IJ SOSY
1.0000 ug/kg/h | PREFILLED_SYRINGE | INTRAMUSCULAR | Status: DC | PRN
  Filled 2015-11-28: qty 2

## 2015-11-28 MED ORDER — ONDANSETRON HCL 4 MG/2ML IJ SOLN: 4.0000 mg | Freq: Three times a day (TID) | INTRAMUSCULAR | Status: DC | PRN

## 2015-11-28 MED ORDER — MENTHOL 3 MG MT LOZG: 1.0000 | LOZENGE | OROMUCOSAL | Status: DC | PRN

## 2015-11-28 MED ORDER — DIPHENHYDRAMINE HCL 25 MG PO CAPS: 25.0000 mg | ORAL_CAPSULE | Freq: Four times a day (QID) | ORAL | Status: DC | PRN

## 2015-11-28 MED ORDER — OXYTOCIN 10 UNIT/ML IJ SOLN
INTRAMUSCULAR | Status: AC
  Filled 2015-11-28: qty 4

## 2015-11-28 MED ORDER — LACTATED RINGERS IV SOLN
INTRAVENOUS | Status: DC
Start: 2015-11-28 — End: 2015-11-30
  Administered 2015-11-28: 23:00:00 via INTRAVENOUS

## 2015-11-28 MED ORDER — SCOPOLAMINE 1 MG/3DAYS TD PT72: 1.0000 | MEDICATED_PATCH | Freq: Once | TRANSDERMAL | Status: DC

## 2015-11-28 MED ORDER — PRENATAL MULTIVITAMIN CH
1.0000 | ORAL_TABLET | Freq: Every day | ORAL | Status: DC
  Administered 2015-11-29: 1 via ORAL
  Filled 2015-11-28: qty 1

## 2015-11-28 MED ORDER — OXYTOCIN 40 UNITS IN LACTATED RINGERS INFUSION - SIMPLE MED: INTRAVENOUS | Status: DC | PRN

## 2015-11-28 MED ORDER — LACTATED RINGERS IV SOLN: INTRAVENOUS | Status: DC | PRN

## 2015-11-28 MED ORDER — TETANUS-DIPHTH-ACELL PERTUSSIS 5-2.5-18.5 LF-MCG/0.5 IM SUSP: 0.5000 mL | Freq: Once | INTRAMUSCULAR | Status: DC

## 2015-11-28 MED ORDER — BUPIVACAINE HCL (PF) 0.5 % IJ SOLN
INTRAMUSCULAR | Status: AC
  Filled 2015-11-28: qty 30

## 2015-11-28 MED ORDER — ZOLPIDEM TARTRATE 5 MG PO TABS: 5.0000 mg | ORAL_TABLET | Freq: Every evening | ORAL | Status: DC | PRN

## 2015-11-28 SURGICAL SUPPLY — 32 items
APL SKNCLS STERI-STRIP NONHPOA (GAUZE/BANDAGES/DRESSINGS) ×2
BENZOIN TINCTURE PRP APPL 2/3 (GAUZE/BANDAGES/DRESSINGS) ×2 IMPLANT
CLAMP CORD UMBIL (MISCELLANEOUS) ×2 IMPLANT
CLIP FILSHIE TUBAL LIGA STRL (Clip) ×2 IMPLANT
CLOTH BEACON ORANGE TIMEOUT ST (SAFETY) ×3 IMPLANT
DRSG OPSITE POSTOP 4X10 (GAUZE/BANDAGES/DRESSINGS) ×3 IMPLANT
DURAPREP 26ML APPLICATOR (WOUND CARE) ×3 IMPLANT
ELECT REM PT RETURN 9FT ADLT (ELECTROSURGICAL) ×3
ELECTRODE REM PT RTRN 9FT ADLT (ELECTROSURGICAL) ×2 IMPLANT
GLOVE BIO SURGEON STRL SZ 6.5 (GLOVE) ×3 IMPLANT
GLOVE BIOGEL PI IND STRL 7.0 (GLOVE) ×4 IMPLANT
GLOVE BIOGEL PI INDICATOR 7.0 (GLOVE) ×2
GOWN STRL REUS W/TWL LRG LVL3 (GOWN DISPOSABLE) ×6 IMPLANT
KIT ABG SYR 3ML LUER SLIP (SYRINGE) ×2 IMPLANT
NDL HYPO 25X5/8 SAFETYGLIDE (NEEDLE) IMPLANT
NEEDLE HYPO 22GX1.5 SAFETY (NEEDLE) ×3 IMPLANT
NEEDLE HYPO 25X5/8 SAFETYGLIDE (NEEDLE) ×3 IMPLANT
NS IRRIG 1000ML POUR BTL (IV SOLUTION) ×3 IMPLANT
PACK C SECTION WH (CUSTOM PROCEDURE TRAY) ×3 IMPLANT
PAD OB MATERNITY 4.3X12.25 (PERSONAL CARE ITEMS) ×3 IMPLANT
PENCIL SMOKE EVAC W/HOLSTER (ELECTROSURGICAL) ×3 IMPLANT
RETRACTOR WND ALEXIS 25 LRG (MISCELLANEOUS) ×2 IMPLANT
RTRCTR WOUND ALEXIS 25CM LRG (MISCELLANEOUS) ×3
STRIP CLOSURE SKIN 1/2X4 (GAUZE/BANDAGES/DRESSINGS) ×2 IMPLANT
SUT PLAIN 2 0 XLH (SUTURE) ×2 IMPLANT
SUT VIC AB 0 CT1 36 (SUTURE) ×18 IMPLANT
SUT VIC AB 2-0 CT1 27 (SUTURE) ×3
SUT VIC AB 2-0 CT1 TAPERPNT 27 (SUTURE) ×2 IMPLANT
SUT VIC AB 4-0 PS2 27 (SUTURE) ×3 IMPLANT
SYR CONTROL 10ML LL (SYRINGE) ×2 IMPLANT
TOWEL OR 17X24 6PK STRL BLUE (TOWEL DISPOSABLE) ×3 IMPLANT
TRAY FOLEY CATH SILVER 14FR (SET/KITS/TRAYS/PACK) ×2 IMPLANT

## 2015-11-28 NOTE — Anesthesia Procedure Notes (Signed)
Spinal Patient location during procedure: OR Start time: 11/28/2015 1:04 PM End time: 11/28/2015 1:06 PM Staffing Anesthesiologist: Suella Broad D Performed by: anesthesiologist  Preanesthetic Checklist Completed: patient identified, site marked, surgical consent, pre-op evaluation, timeout performed, IV checked, risks and benefits discussed and monitors and equipment checked Spinal Block Patient position: sitting Prep: Betadine Patient monitoring: heart rate, continuous pulse ox, blood pressure and cardiac monitor Approach: midline Location: L4-5 Injection technique: single-shot Needle Needle type: Introducer and Pencan  Needle gauge: 24 G Needle length: 9 cm Additional Notes Negative paresthesia. Negative blood return. Positive free-flowing CSF. Expiration date of kit checked and confirmed. Patient tolerated procedure well, without complications.

## 2015-11-28 NOTE — Progress Notes (Signed)
Subjective:  Wanda Harris is a 35 y.o. [id number] at [redacted]w[redacted]d being seen today for ongoing prenatal care.  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  Patient reports no complaints.  Contractions: Irregular. Vag. Bleeding: None.  Movement: Present. Denies leaking of fluid.   The following portions of the patient's history were reviewed and updated as appropriate: allergies, current medications, past family history, past medical history, past social history, past surgical history and problem list. Problem list updated.  Objective:   Filed Vitals:   11/28/15 0906  BP: 114/64  Pulse: 77  Weight: 189 lb 3.2 oz (85.821 kg)    Fetal Status: Fetal Heart Rate (bpm): NST   Movement: Present  Presentation: Vertex  General:  Alert, oriented and cooperative. Patient is in no acute distress.  Skin: Skin is warm and dry. No rash noted.   Cardiovascular: Normal heart rate noted  Respiratory: Normal respiratory effort, no problems with respiration noted  Abdomen: Soft, gravid, appropriate for gestational age. Pain/Pressure: Present     Pelvic: deferred  Extremities: Normal range of motion.     Mental Status: Normal mood and affect. Normal behavior. Normal judgment and thought content.   Urinalysis:      Assessment and Plan:  Pregnancy: [id number] at [redacted]w[redacted]d  1. Gestational diabetes mellitus (GDM) affecting pregnancy -better control on increased dose of metformin (1gm/1gm). BS log 6/1 115, 122, 130, 127 6/2 98, 113, 142, 102, 6/3 111, 119, 133, 141 6/4 115, 111, 129, 133 Previously she was running in the  160s-170s Recommend increasing to 1500/1500 -NRNST even after VAS at 0953 (normal baseline, mod variability, no decels, negative toco). Will rpt BPP (6/2 BPP 8/10 for NRNST) and showed LGA fetus, large AC with normal AFI, cephalic - [place] MFM FETAL BPP WO NON STRESS; Future  2. Supervision of young multigravida, antepartum, unspecified trimester -Needs GC/CT nv  3. 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  4. GBS (group B streptococcus) UTI complicating pregnancy, unspecified trimester -tx in labor  5. Pyelonephritis affecting pregnancy in third trimester, antepartum -follow to see if pt on ppx with keflex nv  6. Non-stress test nonreactive For BPP today - [place] MFM FETAL BPP WO NON STRESS; Future  Term labor symptoms and general obstetric precautions including but not limited to vaginal bleeding, contractions, leaking of fluid and fetal movement were reviewed in detail with the patient. Please refer to After Visit Summary for other counseling recommendations.  Return in about 3 days (around 12/01/2015). to review BS log, rpt NST.    Aletha Halim, MD

## 2015-11-28 NOTE — Op Note (Signed)
Marigene Ehlers PROCEDURE DATE: 11/28/2015  PREOPERATIVE DIAGNOSES: Intrauterine pregnancy at [redacted]w[redacted]d weeks gestation; non-reassuring fetal status; BPP 4/10,  undesired fertility  POSTOPERATIVE DIAGNOSES: The same.   PROCEDURE: Repeat Low Transverse Cesarean Section, Bilateral Tubal Sterilization using Filshie clips  SURGEON:  Emeterio Reeve MD, Caren Macadam, MD    INDICATIONS: Wanda Harris is a 35 y.o. [phone] at [redacted]w[redacted]d here for cesarean section and bilateral tubal sterilization secondary to the indications listed under preoperative diagnoses; please see preoperative note for further details.  The risks of surgery were discussed with the patient including but were not limited to: bleeding which may require transfusion or reoperation; infection which may require antibiotics; injury to bowel, bladder, ureters or other surrounding organs; injury to the fetus; need for additional procedures including hysterectomy in the event of a life-threatening hemorrhage; placental abnormalities wth subsequent pregnancies, incisional problems, thromboembolic phenomenon and other postoperative/anesthesia complications.  Patient also desires permanent sterilization.  Other reversible forms of contraception were discussed with patient; she declines all other modalities. Risks of procedure discussed with patient including but not limited to: risk of regret, permanence of method, bleeding, infection, injury to surrounding organs and need for additional procedures.  Failure risk of 1-2% with increased risk of ectopic gestation if pregnancy occurs was also discussed with patient.  The patient concurred with the proposed plan, giving informed written consent for the procedures.    FINDINGS:  Severe polyhydramnios. Viable female infant in cephalic presentation.  Notable hypotonia and umbilical cord was cut and infant was handed to awaiting NICU team. Apgars 1 and 6.  Cord gas was collected and resulted at pH= 6.898. Clear  amniotic fluid.  Intact placenta, three vessel cord.  Normal uterus, fallopian tubes and ovaries bilaterally. Fallopian tubes sterilized with Filshie clips bilaterally.  ANESTHESIA: Spinal INTRAVENOUS FLUIDS: 400 ml ESTIMATED BLOOD LOSS: 800 ml URINE OUTPUT:  350 ml SPECIMENS: Placenta sent to pathology COMPLICATIONS: None immediate  PROCEDURE IN DETAIL:  The patient preoperatively received intravenous antibiotics and had sequential compression devices applied to her lower extremities.   She was then taken to the operating room where spinal anesthesia was administered and was found to be adequate. She was then placed in a dorsal supine position with a leftward tilt, and prepped and draped in a sterile manner.  A foley catheter was placed into her bladder and attached to constant gravity.  After an adequate timeout was performed, a Pfannenstiel skin incision was made with scalpel and carried through to the underlying layer of fascia. The fascia was incised in the midline, and this incision was extended bilaterally using the Mayo scissors.  Kocher clamps were applied to the superior aspect of the fascial incision and the underlying rectus muscles were dissected off with the bovie. A similar process was carried out on the inferior aspect of the fascial incision. The rectus muscles were separated in the midline with the bovie and the peritoneum was entered bluntly. Attention was turned to the lower uterine segment where a low transverse hysterotomy was made with a scalpel and extended bilaterally bluntly.  The infant was successfully delivered easily and  the cord was clamped and cut and the infant was handed over to awaiting neonatology team. Uterine massage was then administered, and the placenta delivered intact with a three-vessel cord. The uterus was then cleared of clot and debris.  The hysterotomy was closed with 0 Vicryl in a running locked fashion, and an imbricating layer was also placed with 0  Vicryl.  Attention was then turned to the fallopian tubes, and Filshie clips were placed about 3 cm from the cornua, with care given to incorporate the underlying mesosalpinx on both sides, allowing for bilateral tubal sterilization. The pelvis was cleared of all clot and debris. Hemostasis was confirmed on all surfaces.  The peritoneum and the muscles were reapproximated using 0 Vicryl interrupted stitches. The fascia was then closed using 0 Vicryl in a running fashion.  The subcutaneous layer was irrigated, then reapproximated with 2-0 plain gut interrupted stitches, and 30 ml of 0.5% Marcaine was injected subcutaneously around the incision.  The skin was closed with a 4-0 Vicryl subcuticular stitch. The patient tolerated the procedure well. Sponge, lap, instrument and needle counts were correct x 2.  She was taken to the recovery room in stable condition.   Caren Macadam, MD Family Medicine, OB Fellow [facility]

## 2015-11-28 NOTE — Transfer of Care (Signed)
Immediate Anesthesia Transfer of Care Note  Patient: Wanda Harris  Procedure(s) Performed: Procedure(s): CESAREAN SECTION WITH BILATERAL TUBAL LIGATION (Bilateral)  Patient Location: PACU  Anesthesia Type:Spinal  Level of Consciousness: awake, alert  and oriented  Airway & Oxygen Therapy: Patient Spontanous Breathing  Post-op Assessment: Report given to RN and Post -op Vital signs reviewed and stable  Post vital signs: Reviewed and stable  Last Vitals:  Filed Vitals:   11/28/15 1218  BP: 115/73  Pulse: 64  Temp: 36.8 C  Resp: 18    Last Pain:  Filed Vitals:   11/28/15 1338  PainSc: 0-No pain         Complications: No apparent anesthesia complications

## 2015-11-28 NOTE — Brief Op Note (Signed)
11/28/2015  2:08 PM  PATIENT:  Wanda Harris  35 y.o. female  PRE-OPERATIVE DIAGNOSIS:  Fetal ascites and non-reassuring surveillance  POST-OPERATIVE DIAGNOSIS:  Fetal ascites and non-reassuring surveillance  PROCEDURE:  Procedure(s): CESAREAN SECTION WITH BILATERAL TUBAL LIGATION (Bilateral)  SURGEON:  Surgeon(s) and Role:    * Woodroe Mode, MD - Primary    * Caren Macadam, MD - Fellow  EBL:  Total I/O In: 400 [I.V.:400] Out: 1150 [Urine:350; Blood:800]

## 2015-11-28 NOTE — Anesthesia Postprocedure Evaluation (Signed)
Anesthesia Post Note  Patient: Wanda Harris  Procedure(s) Performed: Procedure(s) (LRB): CESAREAN SECTION WITH BILATERAL TUBAL LIGATION (Bilateral)  Patient location during evaluation: PACU Anesthesia Type: Spinal Level of consciousness: oriented and awake and alert Pain management: pain level controlled Vital Signs Assessment: post-procedure vital signs reviewed and stable Respiratory status: spontaneous breathing, respiratory function stable and patient connected to nasal cannula oxygen Cardiovascular status: blood pressure returned to baseline and stable Postop Assessment: no headache, no backache and spinal receding Anesthetic complications: no     Last Vitals:  Filed Vitals:   11/28/15 1600 11/28/15 1613  BP: 104/68 101/65  Pulse: 55 54  Temp:  36.5 C  Resp: 13     Last Pain:  Filed Vitals:   11/28/15 1614  PainSc: 0-No pain   Pain Goal:                 Effie Berkshire

## 2015-11-28 NOTE — H&P (Signed)
Obstetric Preoperative History and Physical  Wanda Harris is a 35 y.o. [phone] with IUP at [redacted]w[redacted]d presenting for presenting for  cesarean section due to non reassuring BPP of 4/10 with decreased fetal movement. MFM referred patient to LD for urgent delivery.  No acute concerns.   Pregnancy complicated by 1) Class B GDM- starting HA1C 7.9% 2) [place] in pregnancy- TOC neg 3) Pyelonephritis in pregnancy- in early 3rd trimester 4) GBS UTI 5) History of prior C-section for fetal intolerance  Ultrasound review:  @36  weeks 3878 > 90th%  Prenatal Course Source of Care: [facility]  with onset of care at 18 weeks Pregnancy complications or risks: Patient Active Problem List   Diagnosis Date Noted  . Pyelonephritis affecting pregnancy in third trimester, antepartum 10/21/2015  . Trichomonal vaginitis in pregnancy 08/06/2015  . Supervision of young multigravida, antepartum 08/04/2015  . GBS (group B streptococcus) UTI complicating pregnancy [id number]  . Gestational diabetes mellitus (GDM) affecting pregnancy 08/04/2015  . Uterine scar from previous surgery affecting pregnancy 08/04/2015   She plans to breastfeed She desires Undecided for postpartum contraception.   Prenatal labs and studies: ABO, Rh: --/--/O POS (04/28 2015) Antibody: NEG (04/28 2015) Rubella: 2.75 (02/09 0957) RPR: NON REAC (03/27 0944)  HBsAg: NEGATIVE (02/09 0957)  HIV: NONREACTIVE (03/27 0944)  GBS: UTI in pregnancy 1 hr Glucola  NA, patient is a Class B GDM patient Genetic screening normal Anatomy US normal  Prenatal Transfer Tool  Maternal Diabetes: Yes:  Diabetes Type:  Pre-pregnancy Genetic Screening: Normal Maternal Ultrasounds/Referrals: Normal for anatomy US but BPP on 11/28/2015 was 4/10 Fetal Ultrasounds or other Referrals:  Fetal echo- WNL Maternal Substance Abuse:  No Significant Maternal Medications:  None Significant Maternal Lab Results: Lab values include: Group B Strep positive  Past Medical  History  Diagnosis Date  . Diabetes mellitus without complication (HCC)     GDM  . Gestational diabetes     G2 & G5, takes insulin  . Asthma     does not use inhaler    Past Surgical History  Procedure Laterality Date  . Cesarean section N/A 07/01/2013    Procedure: Primary Cesarean Section Delivery Baby Boy @ 2038, Apgars 7/9;  Surgeon: Jonnie Kind, MD;  Location: [facility];  Service: Obstetrics;  Laterality: N/A;    OB History  Gravida Para Term Preterm AB SAB TAB Ectopic Multiple Living  6 4 4  1 1  0   4    # Outcome Date GA Lbr Len/2nd Weight Sex Delivery Anes PTL Lv  6 Current           5 Term 07/01/13 [redacted]w[redacted]d   M CS-LTranv EPI  Y     Complications: Fetal Intolerance  4 Term 04/12/07 [redacted]w[redacted]d  7 lb 5 oz (3.317 kg) F Vag-Spont EPI  Y  3 SAB 2007          2 Term 2003 [redacted]w[redacted]d  8 lb 15 oz (4.054 kg) M Vag-Spont EPI  Y  1 Term 03/01/01 [redacted]w[redacted]d  7 lb 2 oz (3.232 kg) F Vag-Spont EPI  Y      Social History   Social History  . Marital Status: Divorced    Spouse Name: N/A  . Number of Children: N/A  . Years of Education: N/A   Social History Main Topics  . Smoking status: Former Smoker    Quit date: 05/02/2015  . Smokeless tobacco: Never Used  . Alcohol Use: No  . Drug Use: No  .  Sexual Activity: Yes    Birth Control/ Protection: None   Other Topics Concern  . Not on file   Social History Narrative    Family History  Problem Relation Age of Onset  . Diabetes Mother   . Hypertension Mother   . Arthritis Mother   . Hyperlipidemia Father   . Heart disease Father   . Stroke Father     Prescriptions prior to admission  Medication Sig Dispense Refill Last Dose  . ACCU-CHEK FASTCLIX LANCETS MISC USE 1 UTD QID  12 Taking  . ACCU-CHEK SMARTVIEW test strip USE TO CHECK BLOOD SUGAR QID  11 Taking  . docusate sodium (COLACE) 100 MG capsule Take 1 capsule (100 mg total) by mouth daily. 30 capsule 2 Taking  . glyBURIDE (DIABETA) 2.5 MG tablet Reported on 11/28/2015  8 Not Taking   . metFORMIN (GLUCOPHAGE) 500 MG tablet Take 1 tablet (500 mg total) by mouth 2 (two) times daily with a meal. (Patient taking differently: Take 500 mg by mouth 2 (two) times daily with a meal. Pt states she is taking 2 tablets in the morning and 2 tablets @ bedtime.) 60 tablet 1 Taking  . oxyCODONE-acetaminophen (PERCOCET/ROXICET) 5-325 MG tablet Take 1 tablet by mouth every 4 (four) hours as needed. (Patient not taking: Reported on 11/14/2015) 15 tablet 0 Not Taking  . Prenatal Vit-Fe Fumarate-FA (MULTIVITAMIN-PRENATAL) 27-0.8 MG TABS tablet Take 1 tablet by mouth daily at 12 noon.   Taking    No Known Allergies  Review of Systems: Negative except for what is mentioned in HPI.  Physical Exam: LMP 03/14/2015 (Exact Date) FHR by Doppler:  152 bpm CONSTITUTIONAL: Well-developed, well-nourished female in no acute distress.  HENT:  Normocephalic, atraumatic, External right and left ear normal. Oropharynx is clear and moist EYES: Conjunctivae and EOM are normal. Pupils are equal, round, and reactive to light. No scleral icterus.  NECK: Normal range of motion, supple, no masses SKIN: Skin is warm and dry. No rash noted. Not diaphoretic. No erythema. No pallor. [place]: Alert and oriented to person, place, and time. Normal reflexes, muscle tone coordination. No cranial nerve deficit noted. PSYCHIATRIC: Normal mood and affect. Normal behavior. Normal judgment and thought content. CARDIOVASCULAR: Normal heart rate noted, regular rhythm RESPIRATORY: Effort and breath sounds normal, no problems with respiration noted ABDOMEN: Soft, nontender, nondistended, gravid. Well healed pfannenstiel scar PELVIC: Deferred MUSCULOSKELETAL: Normal range of motion. No edema and no tenderness. 2+ distal pulses.  NST: 150/minimal/no accels, + late decels  Pertinent Labs/Studies:   Results for orders placed or performed in visit on 11/28/15 (from the past 72 hour(s))  POCT urinalysis dip (device)     Status:  Abnormal   Collection Time: 11/28/15 10:27 AM  Result Value Ref Range   Glucose, UA >=1000 (A) NEGATIVE mg/dL   Bilirubin Urine NEGATIVE NEGATIVE   Ketones, ur NEGATIVE NEGATIVE mg/dL   Specific Gravity, Urine <=1.005 1.005 - 1.030   Hgb urine dipstick NEGATIVE NEGATIVE   pH 5.0 5.0 - 8.0   Protein, ur NEGATIVE NEGATIVE mg/dL   Urobilinogen, UA 0.2 0.0 - 1.0 mg/dL   Nitrite NEGATIVE NEGATIVE   Leukocytes, UA NEGATIVE NEGATIVE    Comment: Biochemical Testing Only. Please order routine urinalysis from main lab if confirmatory testing is needed.    Assessment and Plan :Wanda Harris is a 35 y.o. [id number] at [redacted]w[redacted]d being admitted being admitted for scheduled cesarean section. The risks of cesarean section discussed with the patient included but were not limited  to: bleeding which may require transfusion or reoperation; infection which may require antibiotics; injury to bowel, bladder, ureters or other surrounding organs; injury to the fetus; need for additional procedures including hysterectomy in the event of a life-threatening hemorrhage; placental abnormalities wth subsequent pregnancies, incisional problems, thromboembolic phenomenon and other postoperative/anesthesia complications. The patient concurred with the proposed plan, giving informed written consent for the procedure. Patient has been NPO since last night she will remain NPO for procedure. Anesthesia and OR aware. Preoperative prophylactic antibiotics and SCDs ordered on call to the OR. To OR when ready.   Caren Macadam, MD , MPH, ABFM Family Medicine, OB Fellow [facility]

## 2015-11-28 NOTE — Anesthesia Preprocedure Evaluation (Signed)
Anesthesia Evaluation  Patient identified by MRN, date of birth, ID band Patient awake    Reviewed: Allergy & Precautions, NPO status , Patient's Chart, lab work & pertinent test results  Airway Mallampati: II   Neck ROM: Full    Dental  (+) Teeth Intact   Pulmonary asthma , former smoker,    breath sounds clear to auscultation       Cardiovascular negative cardio ROS   Rhythm:Regular Rate:Normal     Neuro/Psych negative neurological ROS  negative psych ROS   GI/Hepatic negative GI ROS, Neg liver ROS,   Endo/Other  diabetes  Renal/GU   negative genitourinary   Musculoskeletal negative musculoskeletal ROS (+)   Abdominal   Peds negative pediatric ROS (+)  Hematology negative hematology ROS (+)   Anesthesia Other Findings   Reproductive/Obstetrics (+) Pregnancy                             Lab Results  Component Value Date   WBC 8.4 11/28/2015   HGB 10.3* 11/28/2015   HCT 32.3* 11/28/2015   MCV 70.8* 11/28/2015   PLT 242 11/28/2015   No results found for: INR, PROTIME   Anesthesia Physical Anesthesia Plan  ASA: III  Anesthesia Plan: Spinal   Post-op Pain Management:    Induction:   Airway Management Planned:   Additional Equipment:   Intra-op Plan:   Post-operative Plan:   Informed Consent: I have reviewed the patients History and Physical, chart, labs and discussed the procedure including the risks, benefits and alternatives for the proposed anesthesia with the patient or authorized representative who has indicated his/her understanding and acceptance.   Dental advisory given  Plan Discussed with: CRNA  Anesthesia Plan Comments:         Anesthesia Quick Evaluation

## 2015-11-28 NOTE — Progress Notes (Signed)
Pt reports yellow vaginal d/c w/ odor and itching.  [place] for growth and BPP done 6/2.

## 2015-11-29 LAB — CBC
HEMATOCRIT: 30.6 % — AB (ref 36.0–46.0)
HEMOGLOBIN: 9.7 g/dL — AB (ref 12.0–15.0)
MCH: 22.7 pg — AB (ref 26.0–34.0)
MCHC: 31.7 g/dL (ref 30.0–36.0)
MCV: 71.7 fL — AB (ref 78.0–100.0)
Platelets: 229 10*3/uL (ref 150–400)
RBC: 4.27 MIL/uL (ref 3.87–5.11)
RDW: 15.9 % — ABNORMAL HIGH (ref 11.5–15.5)
WBC: 10.2 10*3/uL (ref 4.0–10.5)

## 2015-11-29 LAB — GLUCOSE, CAPILLARY
GLUCOSE-CAPILLARY: 167 mg/dL — AB (ref 65–99)
GLUCOSE-CAPILLARY: 125 mg/dL — AB (ref 65–99)
Glucose-Capillary: 126 mg/dL — ABNORMAL HIGH (ref 65–99)
Glucose-Capillary: 152 mg/dL — ABNORMAL HIGH (ref 65–99)

## 2015-11-29 LAB — GC/CHLAMYDIA PROBE AMP (~~LOC~~) NOT AT ARMC
CHLAMYDIA, DNA PROBE: NEGATIVE
Neisseria Gonorrhea: NEGATIVE

## 2015-11-29 NOTE — Lactation Note (Signed)
This note was copied from a baby's chart. Lactation Consultation Note  Patient Name: Wanda Harris [id number] Date: 11/29/2015 Reason for consult: Initial assessment;NICU baby NICU baby 8 hours old. Mom reports that she is pumping every 2-3 hours and was able to get some colostrum earlier that she sent to NICU. Mom return-demonstrated hand expression with colostrum flowing. Enc mom to continue pumping every 2-3 hours, except for one 4 to 5-hour stretch for sleeping at night. Enc mom to hand express after pumping. Mom given NICU booklet and [place] brochure with review and is aware of OP/BFSG and [place] phone line assistance after D/C. Enc mom to offer STS and nuzzling at breast as able. Mom states that she is active with [facility] and gave permission to send [place] BF referral--which was faxed to [facility] office.   Maternal Data Has patient been taught Hand Expression?: Yes Does the patient have breastfeeding experience prior to this delivery?: Yes  Feeding    LATCH Score/Interventions                      Lactation Tools Discussed/Used WIC Program: Yes Pump Review: Setup, frequency, and cleaning;Milk Storage Initiated by:: JW Date initiated:: 11/29/15   Consult Status Consult Status: Follow-up Date: 11/30/15 Follow-up type: In-patient    Inocente Salles 11/29/2015, 11:49 AM

## 2015-11-29 NOTE — Progress Notes (Signed)
CSW attempted to meet with MOB to introduce services, offer support, and complete assessment due to baby's admission to NICU, but she was not in her room at this time.  CSW will attempt again at a later time.

## 2015-11-29 NOTE — Addendum Note (Signed)
Addendum  created 11/29/15 [id number] by Flossie Dibble, CRNA   Modules edited: Notes Section   Notes Section:  File: [id number]

## 2015-11-29 NOTE — Progress Notes (Signed)
Post Partum Day 1/POD#1 Subjective:  Wanda Harris is a 35 y.o. [id number] [redacted]w[redacted]d s/p rLTCS for non reassuring fetal status.  No acute events overnight.  Pt denies problems with ambulating, voiding or po intake.  She denies nausea or vomiting.  Pain is well controlled.  She has had flatus.  Lochia Moderate.  Plan for birth control is undecided.  Method of Feeding: breast  Objective: Blood pressure 104/61, pulse 69, temperature 98.7 F (37.1 C), temperature source Oral, resp. rate 20, height 4\' 11"  (1.499 m), last menstrual period 03/14/2015, SpO2 95 %, unknown if currently breastfeeding.  Physical Exam:  General: alert, cooperative and no distress Lochia:normal flow Chest: normal WOB Heart: Regular rate Abdomen: +BS, soft, mild TTP (appropriate). Incision c/d/i Uterine Fundus: firm DVT Evaluation: No evidence of DVT seen on physical exam. Extremities: trace edema   Recent Labs  11/28/15 1150 11/29/15 0536  HGB 10.3* 9.7*  HCT 32.3* 30.6*    Assessment/Plan:  ASSESSMENT: Wanda Harris is a 35 y.o. [id number] [redacted]w[redacted]d s/p rLTCS  Plan for discharge tomorrow vs next Continue routine PP care Breastfeeding support PRN  #Type 2 DM: Continue metformin. Consider adding glyburide if > 140 post prandial sugars.    LOS: 1 day   Caren Macadam 11/29/2015, 7:39 AM

## 2015-11-29 NOTE — Anesthesia Postprocedure Evaluation (Signed)
Anesthesia Post Note  Patient: Wanda Harris  Procedure(s) Performed: Procedure(s) (LRB): CESAREAN SECTION WITH BILATERAL TUBAL LIGATION (Bilateral)  Patient location during evaluation: Women's Unit Anesthesia Type: Spinal Level of consciousness: awake and alert and oriented Pain management: pain level controlled Vital Signs Assessment: post-procedure vital signs reviewed and stable Respiratory status: spontaneous breathing and nonlabored ventilation Cardiovascular status: blood pressure returned to baseline and stable Postop Assessment: no headache, no backache, spinal receding, patient able to bend at knees, no signs of nausea or vomiting and adequate PO intake Anesthetic complications: no     Last Vitals:  Filed Vitals:   11/29/15 0140 11/29/15 0523  BP: 104/55 104/61  Pulse: 71 69  Temp: 36.9 C 37.1 C  Resp: 18 20    Last Pain:  Filed Vitals:   11/29/15 0749  PainSc: Asleep   Pain Goal: Patients Stated Pain Goal: 3 (11/29/15 0522)               Katherina Mires

## 2015-11-29 NOTE — Progress Notes (Signed)
MD on call notified on patient's CBG of 152. No new orders at this time.

## 2015-11-30 ENCOUNTER — Encounter (HOSPITAL_COMMUNITY): Payer: Self-pay

## 2015-11-30 LAB — GLUCOSE, CAPILLARY: GLUCOSE-CAPILLARY: 112 mg/dL — AB (ref 65–99)

## 2015-11-30 MED ORDER — ACETAMINOPHEN 325 MG PO TABS: 650.0000 mg | ORAL_TABLET | Freq: Four times a day (QID) | ORAL | Status: DC | PRN

## 2015-11-30 MED ORDER — METFORMIN HCL ER 500 MG PO TB24: 1000.0000 mg | ORAL_TABLET | Freq: Every day | ORAL | Status: DC

## 2015-11-30 MED ORDER — OXYCODONE-ACETAMINOPHEN 5-325 MG PO TABS: 1.0000 | ORAL_TABLET | Freq: Four times a day (QID) | ORAL | Status: DC | PRN

## 2015-11-30 MED ORDER — IBUPROFEN 600 MG PO TABS: 600.0000 mg | ORAL_TABLET | Freq: Four times a day (QID) | ORAL | Status: DC

## 2015-11-30 NOTE — Lactation Note (Signed)
This note was copied from a baby's chart. Lactation Consultation Note  Patient Name: Girl Luanna Weesner ESLPN'P Date: 11/30/2015 Reason for consult: Follow-up assessment;NICU baby NICU baby 54 hours old. Met with mom in NICU pumping room. Mom reports that she is not seeing any colostrum. Mom has not used DEBP in almost 20 hours. Discussed supply and demand and the pump replacing the stimulation the baby would be providing at the breasts. Enc pumping every 2-3 hours in order to pump 8 times/24 hours, followed by hand expression. Assisted mom with hand expression and mom able to return-demonstration. Mom had moisture present at breasts. Mom concerned that she will not produce milk, and states that this is what happened with her last child. Discussed with mom that there is no replacement for pumping, and no way to know what she can produce unless she pumps regularly. Mom not sure if she pumped routinely with [place]th [place].   Mom states that she has an appointment with [facility] office today for a DEBP. Mom aware of OP/BFSG and [place] phone line assistance after D/C.   Maternal Data    Feeding Feeding Type: Formula Length of feed: 15 min  LATCH Score/Interventions                      Lactation Tools Discussed/Used     Consult Status Consult Status: PRN    Inocente Salles 11/30/2015, 11:21 AM

## 2015-11-30 NOTE — Progress Notes (Signed)
Wanda Harris is coping well for the most part with having her baby in the NICU.  She spoke about her experience with her next oldest child who also had a NICU stay.  She is also feeling some relief that her baby seems to be doing better.  She is sad that they will not be leaving the hospital together, but she feels she needs to get home to her other children.  [place], Bcc Pager, [phone] 9:46 AM    11/30/15 0900  Clinical Encounter Type  Visited With Patient and family together  Visit Type Initial  Referral From Nurse  Spiritual Encounters  Spiritual Needs Emotional

## 2015-11-30 NOTE — Discharge Summary (Signed)
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 yo [id number] at [redacted]w[redacted]d was admitted to the hospital 11/28/2015 for cesarean section with the following indication: 4/10 bpp in setting of decreased fetal movement. Membrane Rupture Time/Date: 1:26 PM ,11/28/2015   Patient delivered a Viable infant.11/28/2015  Details of operation can be found in separate operative note.  Pateint had an uncomplicated postpartum course.  She is ambulating, tolerating a regular diet, passing flatus, and urinating well. Patient is discharged home in stable condition on  11/30/2015          Physical exam  Filed Vitals:   11/29/15 1833 11/29/15 1927 11/29/15 2241 11/30/15 0535  BP: 106/70  103/66 107/69  Pulse: 65  67 61  Temp: 98.5 F (36.9 C)  98.5 F (36.9 C) 98.3 F (36.8 C)  TempSrc: Oral  Oral Oral  Resp: 18  16 16   Height:  4\' 11"  (1.499 m)    SpO2: 99%  98% 98%   General: alert, cooperative and no distress Lochia: appropriate Uterine Fundus:  firm Incision: Healing well with no significant drainage, Dressing is clean, dry, and intact DVT Evaluation: No cords or calf tenderness. No significant calf/ankle edema. Labs: Lab Results  Component Value Date   WBC 10.2 11/29/2015   HGB 9.7* 11/29/2015   HCT 30.6* 11/29/2015   MCV 71.7* 11/29/2015   PLT 229 11/29/2015   CMP Latest Ref Rng 10/21/2015  Glucose 65 - 99 mg/dL 146(H)  BUN 6 - 20 mg/dL 10  Creatinine 0.44 - 1.00 mg/dL 0.53  Sodium 135 - 145 mmol/L 136  Potassium 3.5 - 5.1 mmol/L 3.8  Chloride 101 - 111 mmol/L 109  CO2 22 - 32 mmol/L 19(L)  Calcium 8.9 - 10.3 mg/dL 9.0  Total Protein 6.5 - 8.1 g/dL 6.7  Total Bilirubin 0.3 - 1.2 mg/dL 0.4  Alkaline Phos 38 - 126 U/L 136(H)  AST 15 - 41 U/L 12(L)  ALT 14 - 54 U/L 11(L)    Discharge instruction: per After Visit Summary and "Baby and Me Booklet".  After visit meds:    Medication List    STOP taking these medications  ACCU-CHEK FASTCLIX LANCETS Misc     ACCU-CHEK SMARTVIEW test strip  Generic drug:  glucose blood     metFORMIN 500 MG tablet  Commonly known as:  GLUCOPHAGE  Replaced by:  metFORMIN 500 MG 24 hr tablet      TAKE these medications        acetaminophen 325 MG tablet  Commonly known as:  TYLENOL  Take 2 tablets (650 mg total) by mouth every 6 (six) hours as needed (for pain scale < 4).     docusate sodium 100 MG capsule  Commonly known as:  COLACE  Take 1 capsule (100 mg total) by mouth daily.     ibuprofen 600 MG tablet  Commonly known as:  ADVIL,MOTRIN  Take 1 tablet (600 mg total) by mouth every 6 (six) hours.     metFORMIN 500 MG 24 hr tablet  Commonly known as:  GLUCOPHAGE XR  Take 2 tablets (1,000 mg total) by mouth daily with breakfast.     multivitamin-prenatal 27-0.8 MG Tabs tablet  Take 1 tablet by mouth daily at 12 noon.     oxyCODONE-acetaminophen 5-325 MG tablet  Commonly known as:  PERCOCET/ROXICET  Take 1 tablet by mouth every 6 (six) hours as needed (pain  scale 4-7).        Diet: routine diet  Activity: Advance as tolerated. Pelvic rest for 6 weeks.   Outpatient follow up:6 weeks Follow up Appt:No future appointments. Follow up Visit:No Follow-up on file.  Postpartum contraception: undecided  Newborn Data: Live born female  Birth Weight: 9 lb 12.3 oz (4431 g) APGAR: 1, 6  Baby Feeding: Bottle and Breast Disposition:NICU   11/30/2015 Desma Maxim, MD

## 2015-11-30 NOTE — Discharge Instructions (Signed)
Parto por cesrea - Cuidados posteriores  (Cesarean Delivery, Care After) Siga estas instrucciones durante las prximas semanas. Estas indicaciones le proporcionan informacin general acerca de cmo deber cuidarse despus del procedimiento. El mdico tambin podr darle instrucciones ms especficas. El tratamiento se ha planificado de acuerdo a las prcticas mdicas actuales, pero a veces se producen problemas. Comunquese con el mdico si tiene algn problema o tiene dudas cuando vuelva a su casa.  INSTRUCCIONES PARA EL CUIDADO EN EL HOGAR  Tome slo medicamentos de venta libre o recetados, segn las indicaciones del mdico.  No beba alcohol, especialmente si est amamantando o toma analgsicos.  Nomast[place] tabaco ni fume.  Contine con un adecuado cuidado perineal. El buen cuidado perineal incluye:  Higienizarse de adelante hacia atrs.  Mantener la zona perineal limpia.  Controlar diariamente el corte (incisin) y observar si aumenta el enrojecimiento, si supura, se hincha o se separa la piel.  Limpie la incisin suavemente con jabn y agua todos los das, y luego squela dando golpecitos. Si el mdico la autoriza, deje la incisin al descubierto. Use un apsito (vendaje) si drena lquido o la incisin parece irritada. Si las pequeas tiras [facility] que cruzan la incisin no se caen dentro de los 7 das, retrelas suavemente.  Abrace una almohada al toser o estornudar hasta que la incisin se cure. Esto ayuda a Best boy.  No conduzca vehculos ni opere maquinarias hasta que el mdico la autorice.  Dchese, lvese el cabello y tome baos de inmersin segn las indicaciones de su mdico.  Utilice un sostn que le ajuste bien y que brinde buen soporte a sus Glass blower/designer.  Limite el uso de bombachas de sostn o medias panty.  Beba suficiente lquido para Consulting civil engineer orina clara o de color amarillo plido.  Consuma todos los das alimentos ricos en fibra como cereales y panes  [place], arroz, frijoles y frutas frescas y verduras. Estos alimentos pueden ayudarla a prevenir o Cytogeneticist.  Reanude las actividades como subir escaleras, conducir automviles, levantar objetos pesados, hacer ejercicios o viajar cuando le indique su mdico.  Hable con su mdico acerca de reanudar la actividad sexual. Volver a la actividad sexual depende del riesgo de infeccin, la velocidad de la curacin y la comodidad y su deseo de Financial controller.  Trate de que alguien la ayude con las actividades del hogar y con el recin nacido al menos durante algunos das despus de salir del hospital.  Descanse todo lo que pueda. Trate de descansar o tomar una siesta mientras el beb est durmiendo.  Aumente sus actividades gradualmente.  Cumpla con todos los controles programados para despus del [place]. Es muy importante asistir a todas las visitas de Nurse, adult. En estas visitas, su mdico va a controlarla para asegurarse de que est sanando fsica y emocionalmente. SOLICITE ATENCIN MDICA SI:   Elimina cogulos grandes por la vagina. Guarde algunos cogulos para mostrarle al mdico.  Tiene una secrecin con feo olor que proviene de la vagina.  Tiene dificultad para orinar.  Orina con frecuencia.  Siente dolor al [employer].  Nota un cambio en sus movimientos intestinales.  Aumenta el enrojecimiento, el dolor o la hinchazn en la zona de la incisin.  Observa que supura pus en la incisin.  La incisin se abre.  Sus [employer] duelen, estn duras o enrojecidas.  Sufre un dolor intenso de [place].  Tiene visin borrosa o ve manchas.  Se siente triste o deprimida.  Tiene pensamientos acerca de lastimarse o daar al  recién nacido. °· Tiene preguntas acerca de su cuidado, la atención del recién nacido o acerca de los medicamentos. °· Se siente mareada o sufre un desmayo. °· Tiene una erupción. °· Siente dolor u observa enrojecimiento o hinchazón en el sitio en que  estaba la vía intravenosa (IV). °· Tiene náuseas o vómitos. °· Usted dejó de amamantar al bebé y no ha tenido su período menstrual dentro de las 12 semanas siguientes. °· No amamanta al bebé y no tuvo su período menstrual en las últimas 12 semanas. °· Tiene fiebre. °SOLICITE ATENCIÓN MÉDICA DE INMEDIATO SI:  °· Siente dolor persistente. °· Siente dolor en el pecho. °· Le falta el aire. °· Se desmaya. °· Siente dolor en la pierna. °· Siente dolor en el estómago. °· El sangrado vaginal satura dos o más apósitos en 1 hora. °ASEGÚRESE DE QUE:  °· Comprende estas instrucciones. °· Controlará su enfermedad. °· Recibirá ayuda de inmediato si no mejora o si empeora. °  °Esta información no tiene como fin reemplazar el consejo del médico. Asegúrese de hacerle al médico cualquier pregunta que tenga. °  °Document Released: 06/11/2005 Document Revised: 07/02/2014 °Elsevier Interactive Patient Education ©2016 Elsevier Inc. ° °

## 2015-11-30 NOTE — Progress Notes (Signed)
Discharge teaching complete. Pt understood all information and did not have any questions. Pt went to NICU and will go home from NICU.

## 2015-12-02 ENCOUNTER — Other Ambulatory Visit: Payer: Medicaid Other

## 2015-12-09 ENCOUNTER — Other Ambulatory Visit: Payer: Medicaid Other

## 2015-12-15 ENCOUNTER — Other Ambulatory Visit (HOSPITAL_COMMUNITY): Payer: Self-pay | Admitting: Obstetrics and Gynecology

## 2015-12-15 ENCOUNTER — Other Ambulatory Visit: Payer: Self-pay | Admitting: Obstetrics and Gynecology

## 2015-12-20 NOTE — Telephone Encounter (Signed)
Pt given one month of Metformin as on 6/27 / 17.  Pt will have a Postpartum appt soon so long term diabetic plans can be addressed.

## 2016-01-12 ENCOUNTER — Encounter: Payer: Self-pay | Admitting: Advanced Practice Midwife

## 2016-01-12 ENCOUNTER — Ambulatory Visit (INDEPENDENT_AMBULATORY_CARE_PROVIDER_SITE_OTHER): Payer: Medicaid Other | Admitting: Family Medicine

## 2016-01-12 VITALS — BP 108/63 | HR 67 | Ht 61.0 in | Wt 156.4 lb

## 2016-01-12 DIAGNOSIS — K5900 Constipation, unspecified: Secondary | ICD-10-CM | POA: Insufficient documentation

## 2016-01-12 DIAGNOSIS — O24439 Gestational diabetes mellitus in the puerperium, unspecified control: Secondary | ICD-10-CM | POA: Insufficient documentation

## 2016-01-12 DIAGNOSIS — N9489 Other specified conditions associated with female genital organs and menstrual cycle: Secondary | ICD-10-CM | POA: Insufficient documentation

## 2016-01-12 DIAGNOSIS — E119 Type 2 diabetes mellitus without complications: Secondary | ICD-10-CM

## 2016-01-12 DIAGNOSIS — N949 Unspecified condition associated with female genital organs and menstrual cycle: Secondary | ICD-10-CM | POA: Insufficient documentation

## 2016-01-12 LAB — POCT URINALYSIS DIP (DEVICE)
BILIRUBIN URINE: NEGATIVE
Glucose, UA: 100 mg/dL — AB
KETONES UR: NEGATIVE mg/dL
LEUKOCYTES UA: NEGATIVE
NITRITE: NEGATIVE
Protein, ur: NEGATIVE mg/dL
Specific Gravity, Urine: >=1.03 (ref 1.005–1.030)
Urobilinogen, UA: 0.2 mg/dL (ref 0.0–1.0)
pH: 6 (ref 5.0–8.0)

## 2016-01-12 MED ORDER — METFORMIN HCL 500 MG PO TABS: 500.0000 mg | ORAL_TABLET | Freq: Two times a day (BID) | ORAL | Status: DC

## 2016-01-12 MED ORDER — DOCUSATE SODIUM 100 MG PO CAPS: 100.0000 mg | ORAL_CAPSULE | Freq: Every day | ORAL | Status: DC

## 2016-01-12 NOTE — Patient Instructions (Addendum)
Postpartum Care After Cesarean Delivery After you deliver your newborn (postpartum period), the usual stay in the hospital is 24-72 hours. If there were problems with your labor or delivery, or if you have other medical problems, you might be in the hospital longer.  While you are in the hospital, you will receive help and instructions on how to care for yourself and your newborn during the postpartum period.  While you are in the hospital:  It is normal for you to have pain or discomfort from the incision in your abdomen. Be sure to tell your nurses when you are having pain, where the pain is located, and what makes the pain worse.  If you are breastfeeding, you may feel uncomfortable contractions of your uterus for a couple of weeks. This is normal. The contractions help your uterus get back to normal size.  It is normal to have some bleeding after delivery.  For the first 1-3 days after delivery, the flow is red and the amount may be similar to a period.  It is common for the flow to start and stop.  In the first few days, you may pass some small clots. Let your nurses know if you begin to pass large clots or your flow increases.  Do not  flush blood clots down the toilet before having the nurse look at them.  During the next 3-10 days after delivery, your flow should become more watery and pink or brown-tinged in color.  Ten to fourteen days after delivery, your flow should be a small amount of yellowish-white discharge.  The amount of your flow will decrease over the first few weeks after delivery. Your flow may stop in 6-8 weeks. Most women have had their flow stop by 12 weeks after delivery.  You should change your sanitary pads frequently.  Wash your hands thoroughly with soap and water for at least 20 seconds after changing pads, using the toilet, or before holding or feeding your newborn.  Your intravenous (IV) tubing will be removed when you are drinking enough fluids.  The  urine drainage tube (urinary catheter) that was inserted before delivery may be removed within 6-8 hours after delivery or when feeling returns to your legs. You should feel like you need to empty your bladder within the first 6-8 hours after the catheter has been removed.  In case you become weak, lightheaded, or faint, call your nurse before you get out of bed for the first time and before you take a shower for the first time.  Within the first few days after delivery, your breasts may begin to feel tender and full. This is called engorgement. Breast tenderness usually goes away within 48-72 hours after engorgement occurs. You may also notice milk leaking from your breasts. If you are not breastfeeding, do not stimulate your breasts. Breast stimulation can make your breasts produce more milk.  Spending as much time as possible with your newborn is very important. During this time, you and your newborn can feel close and get to know each other. Having your newborn stay in your room (rooming in) will help to strengthen the bond with your newborn. It will give you time to get to know your newborn and become comfortable caring for your newborn.  Your hormones change after delivery. Sometimes the hormone changes can temporarily cause you to feel sad or tearful. These feelings should not last more than a few days. If these feelings last longer than that, you should talk to your  caregiver.  If desired, talk to your caregiver about methods of family planning or contraception.  Talk to your caregiver about immunizations. Your caregiver may want you to have the following immunizations before leaving the hospital:  Tetanus, diphtheria, and pertussis (Tdap) or tetanus and diphtheria (Td) immunization. It is very important that you and your family (including grandparents) or others caring for your newborn are up-to-date with the Tdap or Td immunizations. The Tdap or Td immunization can help protect your newborn  from getting ill.  Rubella immunization.  Varicella (chickenpox) immunization.  Influenza immunization. You should receive this annual immunization if you did not receive the immunization during your pregnancy.   This information is not intended to replace advice given to you by your health care provider. Make sure you discuss any questions you have with your health care provider.   Document Released: 03/05/2012 Document Reviewed: 03/05/2012 Elsevier Interactive Patient Education 2016 [place].   Postpartum Tubal Ligation, Care After Refer to this sheet in the next few weeks. These instructions provide you with information about caring for yourself after your procedure. Your health care provider may also give you more specific instructions. Your treatment has been planned according to current medical practices, but problems sometimes occur. Call your health care provider if you have any problems or questions after your procedure. WHAT TO EXPECT AFTER THE PROCEDURE After your procedure, it is common to have:  Sore throat.  Soreness at the incision site.  Mild cramping.  Tiredness.  Mild nausea or vomiting. HOME CARE INSTRUCTIONS  Rest for the remainder of the day.  Take medicines only as directed by your health care provider. These include over-the-counter medicines and prescription medicines. Do not take aspirin, which can cause bleeding.  Over the next few days, gradually return to your normal activities and your normal diet.  Avoid sexual intercourse for 2 weeks or as directed by your health care provider.  Do not drive or operate heavy machinery while taking pain medicine.  Do not lift anything that is heavier than 5 lb (2.3 kg) for 2 weeks or as directed by your health care provider.  Do not take baths. Take showers only. Ask your health care provider when you can start taking baths.  Take your temperature twice each day and write it down.  Try to have help for  the first 7-10 days for your household needs.  There are many different ways to close and cover an incision, including stitches (sutures), skin glue, and adhesive strips. Follow instructions from your health care provider about:  Incision care.  Bandage (dressing) changes and removal.  Incision closure removal.  Check your incision area every day for signs of infection. Watch for:  Redness, swelling, or pain.  Fluid, blood, or pus.  Keep all follow-up visits as directed by your health care provider. SEEK MEDICAL CARE IF:  You have redness, swelling, or increasing pain in your incision area.  You have fluid or pus coming from your incision for longer than 1 day.  You notice a bad smell coming from your incision or your dressing.  The edges of your incision break open after the sutures have been removed.  Your pain does not decrease after 2-3 days.  You have a rash.  You repeatedly become dizzy or light-headed.  You have a reaction to your medicine.  Your pain medicine is not helping.  You are constipated. SEEK IMMEDIATE MEDICAL CARE IF:   You have a fever.  You faint.  You  have increasing pain in your abdomen.  You have bleeding or drainage from your suture sites or your vagina after surgery.  You have shortness of breath or have difficulty breathing.  You have chest pain or leg pain.  You have ongoing nausea, vomiting, or diarrhea.   This information is not intended to replace advice given to you by your health care provider. Make sure you discuss any questions you have with your health care provider.   Document Released: 12/11/2011 Document Revised: 10/26/2014 Document Reviewed: 12/11/2011 Elsevier Interactive Patient Education 2016 Elsevier Inc.  Type 2 Diabetes Mellitus, Adult Type 2 diabetes mellitus is a long-term (chronic) disease. In type 2 diabetes:  The pancreas does not make enough of a hormone called insulin.  The cells in the body do not  respond as well to the insulin that is made.  Both of the above can happen. Normally, insulin moves sugars from food into tissue cells. This gives you energy. If you have type 2 diabetes, sugars cannot be moved into tissue cells. This causes high blood sugar (hyperglycemia).  Your doctors will set personal treatment goals for you based on your age, your medicines, how long you have had diabetes, and any other medical conditions you have. Generally, the goal of treatment is to maintain the following blood glucose levels:  Before meals (preprandial): 80-130 mg/dL.  After meals (postprandial): below 180 mg/dL.  A1c: less than 6.5-7%. HOME CARE  Have your hemoglobin A1c level checked twice a year. The level shows if your diabetes is under control or out of control.  Test your blood sugar level every day as told by your doctor.  Check your ketone levels by testing your pee (urine) when you are sick and as told.  Take your diabetes or insulin medicine as told by your doctor.  Never run out of insulin.  Adjust how much insulin you give yourself based on how many carbs (carbohydrates) you eat. Carbs are in many foods, such as fruits, vegetables, whole grains, and dairy products.  Have a healthy snack between every healthy meal. Have 3 meals and 3 snacks a day.  Lose weight if you are overweight.  Carry a medical alert card or wear your medical alert jewelry.  Carry a 15-gram carb snack with you at all times. Examples include:  Glucose pills, 3 or 4.  Glucose gel, 15-gram tube.  Raisins, 2 tablespoons (24 grams).  Jelly beans, 6.  Animal crackers, 8.  Regular (not diet) pop, 4 ounces (120 milliliters).  Gummy treats, 9.  Notice low blood sugar (hypoglycemia) symptoms, such as:  Shaking (tremors).  Trouble thinking clearly.  Sweating.  Faster heart rate.  Headache.  Dry mouth.  Hunger.  Crabbiness (irritability).  Being worried or tense (anxious).  Restless  sleep.  A change in speech or coordination.  Confusion.  Treat low blood sugar right away. If you are alert and can swallow, follow the 15:15 rule:  Take 15-20 grams of a rapid-acting glucose or carb. This includes glucose gel, glucose pills, or 4 ounces (120 milliliters) of fruit juice, regular pop, or low-fat milk.  Check your blood sugar level 15 minutes after taking the glucose.  Take 15-20 grams more of glucose if the repeat blood sugar level is still 70 mg/dL (milligrams/deciliter) or below.  Eat a meal or snack within 1 hour of the blood sugar levels going back to normal.  Notice early symptoms of high blood sugar, such as:  Being really thirsty or drinking a lot (  polydipsia).  Peeing a lot (polyuria).  Do at least 150 minutes of physical activity a week or as told.  Split the 150 minutes of activity up during the week. Do not do 150 minutes of activity in one day.  Perform exercises, such as weight lifting, at least 2 times a week or as told.  Spend no more than 90 minutes at one time inactive.  Adjust your insulin or food intake as needed if you start a new exercise or sport.  Follow your sick-day plan when you are not able to eat or drink as usual.  Do not smoke, chew tobacco, or use electronic cigarettes.  Women who are not pregnant should drink no more than 1 drink a day. Men should drink no more than 2 drinks a day.  Only drink alcohol with food.  Ask your doctor if alcohol is safe for you.  Tell your doctor if you drink alcohol several times during the week.  See your doctor regularly.  Schedule an eye exam soon after you are told you have diabetes. Schedule exams once every year.  Check your skin and feet every day. Check for cuts, bruises, redness, nail problems, bleeding, blisters, or sores. A doctor should do a foot exam once a year.  Brush your teeth and gums twice a day. Floss once a day. Visit your dentist regularly.  Share your diabetes plan  with your workplace or school.  Keep your shots that fight diseases (vaccines) up to date.  Get a flu (influenza) shot every year.  Get a pneumonia shot. If you are 89 years of age or older and you have never gotten a pneumonia shot, you might need to get two shots.  Ask your doctor which other shots you should get.  Learn how to deal with stress.  Get diabetes education and support as needed.  Ask your doctor for special help if:  You need help to maintain or improve how you do things on your own.  You need help to maintain or improve the quality of your life.  You have foot or hand problems.  You have trouble cleaning yourself, dressing, eating, or doing physical activity. GET HELP IF:  You are unable to eat or drink for more than 6 hours.  You feel sick to your stomach (nauseous) or throw up (vomit) for more than 6 hours.  Your blood sugar level is over 240 mg/dL.  There is a change in mental status.  You get another serious illness.  You have watery poop (diarrhea) for more than 6 hours.  You have been sick or have had a fever for 2 or more days and are not getting better.  You have pain when you are active. GET HELP RIGHT AWAY IF:  You have trouble breathing.  Your ketone levels are higher than your doctor says they should be. MAKE SURE YOU:  Understand these instructions.  Will watch your condition.  Will get help right away if you are not doing well or get worse.   This information is not intended to replace advice given to you by your health care provider. Make sure you discuss any questions you have with your health care provider.   Document Released: 03/20/2008 Document Revised: 10/26/2014 Document Reviewed: 01/11/2012 Elsevier Interactive Patient Education [employer].

## 2016-01-12 NOTE — Progress Notes (Signed)
Subjective:     Wanda Harris is a 35 y.o. female who presents for a postpartum visit. She is 6 weeks postpartum following a low cervical transverse Cesarean section. I have fully reviewed the prenatal and intrapartum course. The delivery was at 76 gestational weeks. Outcome: repeat cesarean section, low transverse incision. Anesthesia: spinal. Postpartum course has been unremarkable. Baby's course has been unremarkable. Baby is feeding by both breast and bottle - Enfamil Pregestimil. Bleeding no bleeding and clots. Bowel function is abnormal: constipation. Bladder function is abnormal: dysuria. Patient is sexually active. Contraception method is tubal ligation, not using condoms. Postpartum depression screening: negative.   Yesterday with intercourse, c/o burning inside the vaginal area. No bleeding. No abnormal VD. Already had menses after lochia. LMP 01/05/16, 5 days, normal menses.   The following portions of the patient's history were reviewed and updated as appropriate: allergies, current medications, past family history, past medical history, past social history, past surgical history and problem list.  Patient had gestational diabetes, testing BS at home: 120 2-hr PP dinner. FBS 94-101.   Review of Systems Constitutional: negative, no fevers or chills Respiratory: negative, no SOB, wheezing, coughing Cardiovascular: negative, no chest pain or palpitations Gastrointestinal: positive for constipation and denies melena or hematochezia, +passing flatus. Constipation described as very hard stools Genitourinary:positive for dysuria and denies frequency or hematuria Musculoskeletal:negative Neurological: negative, denies headaches, dizziness Behavioral/Psych: negative for abusive relationship, depression and anxiety     Objective:    BP 108/63 mmHg  Pulse 67  Ht 5\' 1"  (1.549 m)  Wt 156 lb 6.4 oz (70.943 kg)  BMI 29.57 kg/m2  LMP 01/05/2016 (Exact Date)  Breastfeeding? Yes  General:   alert, cooperative and moderately obese   Breasts:  negative  Lungs: clear to auscultation bilaterally  Heart:  regular rate and rhythm, S1, S2 normal, no murmur, click, rub or gallop  Abdomen: abnormal findings:  tenderness to RLQ, no guarding or rebound tenderness, good bowel sounds   Vulva:  normal  Vagina: normal vagina  Cervix:  no cervical motion tenderness, no lesions and normal appearance  Corpus: normal size, contour, position, consistency, mobility, non-tender  Adnexa:  normal adnexa and no mass, fullness, tenderness  Rectal Exam: Not performed.        Assessment:     Normal postpartum exam. Pap smear done 07/2015, normal .   1. Vaginal burning - No overt signs of yeast infection, await wet prep results. Discussed using lubrication during intercourse. - Wet prep, genital  2. Postpartum care following cesarean delivery Normal incision site.   3. Constipation, unspecified constipation type Very hard stools, straining. Colace worked previously. Discussed high fiber diet and increase water intake. - docusate sodium (COLACE) 100 MG capsule; Take 1 capsule (100 mg total) by mouth daily.  Dispense: 30 capsule; Refill: 2  4. Type 2 diabetes mellitus without complication, without long-term current use of insulin (HCC) Reviewing chart and old labs, patient was a diabetic prior to pregnancy, patient was unaware. Discussed and urged patient to follow with PCP for diabetes care. Continue metformin, refilling in order to follow up with PCP.  - metFORMIN (GLUCOPHAGE) 500 MG tablet; Take 1 tablet (500 mg total) by mouth 2 (two) times daily with a meal.  Dispense: 60 tablet; Refill: 3   Plan:    1. Contraception: tubal ligation  2. Follow up in: as needed.

## 2016-01-13 LAB — WET PREP, GENITAL
Clue Cells Wet Prep HPF POC: NONE SEEN
Trich, Wet Prep: NONE SEEN
YEAST WET PREP: NONE SEEN

## 2016-01-26 ENCOUNTER — Other Ambulatory Visit: Payer: Self-pay | Admitting: Obstetrics and Gynecology

## 2016-03-20 ENCOUNTER — Encounter: Payer: Self-pay | Admitting: *Deleted

## 2016-07-10 ENCOUNTER — Emergency Department (HOSPITAL_COMMUNITY)
Admission: EM | Admit: 2016-07-10 | Discharge: 2016-07-10 | Disposition: A | Discharge status: Home / Self Care | Payer: Medicaid Other | Attending: Emergency Medicine | Admitting: Emergency Medicine

## 2016-07-10 ENCOUNTER — Encounter (HOSPITAL_COMMUNITY): Payer: Self-pay

## 2016-07-10 DIAGNOSIS — Z87891 Personal history of nicotine dependence: Secondary | ICD-10-CM | POA: Insufficient documentation

## 2016-07-10 DIAGNOSIS — J45909 Unspecified asthma, uncomplicated: Secondary | ICD-10-CM | POA: Insufficient documentation

## 2016-07-10 DIAGNOSIS — R197 Diarrhea, unspecified: Secondary | ICD-10-CM | POA: Insufficient documentation

## 2016-07-10 DIAGNOSIS — R112 Nausea with vomiting, unspecified: Secondary | ICD-10-CM

## 2016-07-10 LAB — URINALYSIS, ROUTINE W REFLEX MICROSCOPIC
Bilirubin Urine: NEGATIVE
Glucose, UA: NEGATIVE mg/dL
Hgb urine dipstick: NEGATIVE
Ketones, ur: 5 mg/dL — AB
Leukocytes, UA: NEGATIVE
NITRITE: NEGATIVE
Protein, ur: NEGATIVE mg/dL
SPECIFIC GRAVITY, URINE: 1.016 (ref 1.005–1.030)
pH: 6 (ref 5.0–8.0)

## 2016-07-10 LAB — COMPREHENSIVE METABOLIC PANEL
ALT: 16 U/L (ref 14–54)
AST: 14 U/L — AB (ref 15–41)
Albumin: 4.1 g/dL (ref 3.5–5.0)
Alkaline Phosphatase: 84 U/L (ref 38–126)
Anion gap: 7 (ref 5–15)
BUN: 14 mg/dL (ref 6–20)
CHLORIDE: 106 mmol/L (ref 101–111)
CO2: 24 mmol/L (ref 22–32)
CREATININE: 0.46 mg/dL (ref 0.44–1.00)
Calcium: 8.8 mg/dL — ABNORMAL LOW (ref 8.9–10.3)
GFR calc Af Amer: >60 mL/min (ref >60)
Glucose, Bld: 104 mg/dL — ABNORMAL HIGH (ref 65–99)
POTASSIUM: 3.6 mmol/L (ref 3.5–5.1)
Sodium: 137 mmol/L (ref 135–145)
Total Bilirubin: 0.6 mg/dL (ref 0.3–1.2)
Total Protein: 7 g/dL (ref 6.5–8.1)

## 2016-07-10 LAB — CBC
HEMATOCRIT: 36 % (ref 36.0–46.0)
Hemoglobin: 11.3 g/dL — ABNORMAL LOW (ref 12.0–15.0)
MCH: 24.9 pg — ABNORMAL LOW (ref 26.0–34.0)
MCHC: 31.4 g/dL (ref 30.0–36.0)
MCV: 79.5 fL (ref 78.0–100.0)
Platelets: 273 10*3/uL (ref 150–400)
RBC: 4.53 MIL/uL (ref 3.87–5.11)
RDW: 14.3 % (ref 11.5–15.5)
WBC: 8 10*3/uL (ref 4.0–10.5)

## 2016-07-10 LAB — LIPASE, BLOOD: LIPASE: 22 U/L (ref 11–51)

## 2016-07-10 LAB — INFLUENZA PANEL BY PCR (TYPE A & B)
INFLAPCR: NEGATIVE
INFLBPCR: NEGATIVE

## 2016-07-10 MED ORDER — METOCLOPRAMIDE HCL 10 MG PO TABS: 10.0000 mg | ORAL_TABLET | Freq: Four times a day (QID) | ORAL | 0 refills | Status: DC | PRN

## 2016-07-10 MED ORDER — SODIUM CHLORIDE 0.9 % IV BOLUS (SEPSIS)
1000.0000 mL | Freq: Once | INTRAVENOUS | Status: AC
  Administered 2016-07-10: 1000 mL via INTRAVENOUS

## 2016-07-10 MED ORDER — METOCLOPRAMIDE HCL 5 MG/ML IJ SOLN
10.0000 mg | Freq: Once | INTRAMUSCULAR | Status: AC
  Administered 2016-07-10: 10 mg via INTRAVENOUS
  Filled 2016-07-10: qty 2

## 2016-07-10 NOTE — ED Triage Notes (Signed)
Per Pt, Pt reports starting to have nausea, vomiting, and diarrhea that started yesterday. Pt reports being unable to keep any food down. Noted to have abdominal burning. Denies any fevers, cough, or congestion.

## 2016-07-10 NOTE — ED Provider Notes (Signed)
[facility] DEPT Provider Note   CSN: [id number] Arrival date & time: 07/10/16  1237   By signing my name below, I, Soijett Blue, attest that this documentation has been prepared under the direction and in the presence of Courtney Paris, MD. Electronically Signed: Soijett Blue, ED Scribe. 07/10/16. 2:40 PM.  History   Chief Complaint Chief Complaint  Patient presents with  . Emesis    HPI Wanda Harris is a 36 y.o. female with a PMHx of DM, who presents to the Emergency Department complaining of emesis onset yesterday. Pt is having associated symptoms of nausea, diarrhea, decreased appetite x yesterday, HA, and 8-9/10 upper abdominal pain x burning sensation. She has tried aleve with no relief of her symptoms. She denies fever, chills, dysuria, hematuria, malodorous urine, blood in stool, rhinorrhea, nasal congestion, cough, sore throat, vaginal discharge, vaginal bleeding, and any other symptoms. Denies sick contacts. Denies allergies to medications. Pt notes that she has a 78 month old at home whom she alternates between breast feeding and formula.    The history is provided by the patient. No language interpreter was used.    Past Medical History:  Diagnosis Date  . Asthma    does not use inhaler  . Diabetes mellitus without complication (HCC)    GDM  . Gestational diabetes    G2 & G5, takes insulin    Patient Active Problem List   Diagnosis Date Noted  . Postpartum care following cesarean delivery 01/12/2016  . Vaginal burning 01/12/2016  . Constipation 01/12/2016  . Gestational diabetes mellitus (GDM), postpartum 01/12/2016  . S/P repeat low transverse C-section 11/28/2015  . Fetal macrosomia, delivered, current hospitalization 11/28/2015  . Pyelonephritis affecting pregnancy in third trimester, antepartum 10/21/2015  . Trichomonal vaginitis in pregnancy 08/06/2015  . Supervision of young multigravida, antepartum 08/04/2015  . GBS (group B streptococcus) UTI  complicating pregnancy [id number]  . Gestational diabetes mellitus (GDM) affecting pregnancy 08/04/2015  . Uterine scar from previous surgery affecting pregnancy 08/04/2015    Past Surgical History:  Procedure Laterality Date  . CESAREAN SECTION N/A 07/01/2013   Procedure: Primary Cesarean Section Delivery Baby Boy @ 2038, Apgars 7/9;  Surgeon: Jonnie Kind, MD;  Location: [facility];  Service: Obstetrics;  Laterality: N/A;  . CESAREAN SECTION WITH BILATERAL TUBAL LIGATION Bilateral 11/28/2015   Procedure: CESAREAN SECTION WITH BILATERAL TUBAL LIGATION;  Surgeon: Woodroe Mode, MD;  Location: [facility];  Service: Obstetrics;  Laterality: Bilateral;    OB History    Gravida Para Term Preterm AB Living   6 5 5   1 5    SAB TAB Ectopic Multiple Live Births   1 0   0 5       Home Medications    Prior to Admission medications   Medication Sig Start Date End Date Taking? Authorizing Provider  ACCU-CHEK FASTCLIX LANCETS MISC Inject 1 each into the skin 4 (four) times daily -  before meals and at bedtime. 12/23/15   Historical Provider, MD  ACCU-CHEK SMARTVIEW test strip Apply 1 strip topically 4 (four) times daily -  before meals and at bedtime. Use with glucometer 12/23/15   Historical Provider, MD  acetaminophen (TYLENOL) 325 MG tablet Take 2 tablets (650 mg total) by mouth every 6 (six) hours as needed (for pain scale < 4). 11/30/15   Gwynne Edinger, MD  docusate sodium (COLACE) 100 MG capsule Take 1 capsule (100 mg total) by mouth daily. 01/12/16   [facility],  DO  ibuprofen (ADVIL,MOTRIN) 600 MG tablet TAKE 1 TABLET(600 MG) BY MOUTH EVERY 6 HOURS Patient not taking: Reported on 01/12/2016 12/15/15   Gwynne Edinger, MD  metFORMIN (GLUCOPHAGE) 500 MG tablet Take 1 tablet (500 mg total) by mouth 2 (two) times daily with a meal. 01/12/16   Lauralyn Primes Mumaw, DO  metFORMIN (GLUCOPHAGE-XR) 500 MG 24 hr tablet TAKE 2 TABLETS(1000 MG) BY MOUTH DAILY WITH BREAKFAST 01/29/16    Gwynne Edinger, MD  oxyCODONE-acetaminophen (PERCOCET/ROXICET) 5-325 MG tablet Take 1 tablet by mouth every 6 (six) hours as needed (pain scale 4-7). Patient not taking: Reported on 01/12/2016 11/30/15   Gwynne Edinger, MD  Prenatal Vit-Fe Fumarate-FA (MULTIVITAMIN-PRENATAL) 27-0.8 MG TABS tablet Take 1 tablet by mouth daily at 12 noon.     Historical Provider, MD    Family History Family History  Problem Relation Age of Onset  . Diabetes Mother   . Hypertension Mother   . Arthritis Mother   . Hyperlipidemia Father   . Heart disease Father   . Stroke Father     Social History Social History  Substance Use Topics  . Smoking status: Former Smoker    Quit date: 05/02/2015  . Smokeless tobacco: Never Used  . Alcohol use No     Allergies   Patient has no known allergies.   Review of Systems Review of Systems  Constitutional: Positive for appetite change. Negative for chills and fever.  HENT: Negative for congestion, rhinorrhea and sore throat.   Respiratory: Negative for cough.   Gastrointestinal: Positive for abdominal pain, diarrhea, nausea and vomiting. Negative for blood in stool.  Genitourinary: Negative for dysuria, hematuria, vaginal bleeding and vaginal discharge.  Neurological: Positive for headaches.     Physical Exam Updated Vital Signs BP 113/71 (BP Location: Right Arm)   Pulse 72   Temp 98.1 F (36.7 C) (Oral)   Resp 18   Ht (!) 5" (0.127 m)   Wt 157 lb (71.2 kg)   LMP 06/23/2016   SpO2 100%   BMI 4415.32 kg/m   Physical Exam  Constitutional: She is oriented to person, place, and time. She appears well-developed and well-nourished. No distress.  HENT:  Head: Normocephalic and atraumatic.  Right Ear: External ear normal.  Left Ear: External ear normal.  Nose: Nose normal.  Mouth/Throat: Oropharynx is clear and moist. No oropharyngeal exudate.  Eyes: Conjunctivae and EOM are normal. Pupils are equal, round, and reactive to light.  Neck: Normal  range of motion. Neck supple.  Cardiovascular: Normal rate, regular rhythm and normal heart sounds.  Exam reveals no gallop and no friction rub.   No murmur heard. Pulmonary/Chest: Effort normal and breath sounds normal. No stridor. No respiratory distress. She has no wheezes. She has no rales.  Abdominal: Soft. She exhibits no distension. Bowel sounds are increased. There is no tenderness. There is no rebound.  Increased bowel sounds.   Neurological: She is alert and oriented to person, place, and time. She has normal reflexes. She exhibits normal muscle tone. Coordination normal.  Skin: Skin is warm. No rash noted. She is not diaphoretic. No erythema.  Nursing note and vitals reviewed.    ED Treatments / Results  DIAGNOSTIC STUDIES: Oxygen Saturation is 100% on RA, nl by my interpretation.    COORDINATION OF CARE: 2:38 PM Discussed treatment plan with pt at bedside which includes labs, UA, IV fluids, flu swab, reglan, reglan Rx, and pt agreed to plan.  5:41 PM- Pt reassessed and  symptoms have improved following IV fluids and reglan.  Labs (all labs ordered are listed, but only abnormal results are displayed) Labs Reviewed  COMPREHENSIVE METABOLIC PANEL - Abnormal; Notable for the following:       Result Value   Glucose, Bld 104 (*)    Calcium 8.8 (*)    AST 14 (*)    All other components within normal limits  CBC - Abnormal; Notable for the following:    Hemoglobin 11.3 (*)    MCH 24.9 (*)    All other components within normal limits  URINALYSIS, ROUTINE W REFLEX MICROSCOPIC - Abnormal; Notable for the following:    Ketones, ur 5 (*)    All other components within normal limits  LIPASE, BLOOD  INFLUENZA PANEL BY PCR (TYPE A & B)    Procedures Procedures (including critical care time)  Medications Ordered in ED Medications  sodium chloride 0.9 % bolus 1,000 mL (0 mLs Intravenous Stopped 07/10/16 1636)  sodium chloride 0.9 % bolus 1,000 mL (0 mLs Intravenous Stopped  07/10/16 1636)  metoCLOPramide (REGLAN) injection 10 mg (10 mg Intravenous Given 07/10/16 1450)     Initial Impression / Assessment and Plan / ED Course  I have reviewed the triage vital signs and the nursing notes.  Pertinent labs that were available during my care of the patient were reviewed by me and considered in my medical decision making (see chart for details).  Clinical Course    Wanda Harris is a 36 y.o. female with a PMHx of DM, who presents to the Emergency Department complaining of nausea, vomiting, diarrhea, and decreased appetite for 24 hours.   History and exam are seen above. Next on exam, patient had no abdominal tenderness. Lungs were clear. Patient had some mild congestion. Back nontender and nonfocal neurologic exam. Patient appeared well.  Patient given fluids and Reglan for her nausea and for rehydration. Operatory testing was obtained including a flu test which was negative. Other laboratory testing was grossly unremarkable and her anemia was improved from prior.  On reassessment, patient felt much better after the Reglan fluids. Patient felt appropriate for discharge home with plans to follow-up with primary care physician and return for any new or worsening symptoms. Patient will be given prescription for Reglan for her nausea for breakthrough discomfort. Patient understood follow-up plans and was discharged in good condition.     Final Clinical Impressions(s) / ED Diagnoses   Final diagnoses:  Nausea vomiting and diarrhea    New Prescriptions New Prescriptions   METOCLOPRAMIDE (REGLAN) 10 MG TABLET    Take 1 tablet (10 mg total) by mouth every 6 (six) hours as needed for nausea.   I personally performed the services described in this documentation, which was scribed in my presence. The recorded information has been reviewed and is accurate.  Clinical Impression: 1. Nausea vomiting and diarrhea     Disposition: Discharge  Condition: Good  I have  discussed the results, Dx and Tx plan with the pt(& family if present). He/she/they expressed understanding and agree(s) with the plan. Discharge instructions discussed at great length. Strict return precautions discussed and pt &/or family have verbalized understanding of the instructions. No further questions at time of discharge.    New Prescriptions   METOCLOPRAMIDE (REGLAN) 10 MG TABLET    Take 1 tablet (10 mg total) by mouth every 6 (six) hours as needed for nausea.    Follow Up: [facility]  [place] [phone] Schedule an appointment as soon as possible for a visit    [facility] [address] [id number] [place] [phone]  If symptoms worsen     Courtney Paris, MD 07/10/16 [phone]

## 2016-07-10 NOTE — Discharge Instructions (Signed)
Please stay hydrated. Please wash her hands to prevent spreading of your likely viral gastroenteritis. Please take the nausea medicine as needed for your nausea. Please follow-up with your primary care physician for further management. If symptoms worsen, please return to the nearest emergency department.

## 2016-07-10 NOTE — ED Notes (Signed)
Pt able to tolerate po liquids.  

## 2017-03-06 DIAGNOSIS — E785 Hyperlipidemia, unspecified: Secondary | ICD-10-CM | POA: Insufficient documentation

## 2017-03-06 DIAGNOSIS — J45909 Unspecified asthma, uncomplicated: Secondary | ICD-10-CM | POA: Insufficient documentation

## 2017-03-06 DIAGNOSIS — E119 Type 2 diabetes mellitus without complications: Secondary | ICD-10-CM | POA: Insufficient documentation

## 2017-03-06 DIAGNOSIS — G44209 Tension-type headache, unspecified, not intractable: Secondary | ICD-10-CM | POA: Insufficient documentation

## 2017-03-13 DIAGNOSIS — Z720 Tobacco use: Secondary | ICD-10-CM | POA: Insufficient documentation

## 2017-03-19 DIAGNOSIS — G43909 Migraine, unspecified, not intractable, without status migrainosus: Secondary | ICD-10-CM | POA: Insufficient documentation

## 2017-07-20 DIAGNOSIS — S2232XA Fracture of one rib, left side, initial encounter for closed fracture: Secondary | ICD-10-CM

## 2017-07-20 DIAGNOSIS — S62609A Fracture of unspecified phalanx of unspecified finger, initial encounter for closed fracture: Secondary | ICD-10-CM

## 2017-07-20 HISTORY — DX: Fracture of one rib, left side, initial encounter for closed fracture: S22.32XA

## 2017-07-20 HISTORY — DX: Fracture of unspecified phalanx of unspecified finger, initial encounter for closed fracture: S62.609A

## 2017-07-21 ENCOUNTER — Emergency Department (HOSPITAL_COMMUNITY): Admission: EM | Admit: 2017-07-21 | Discharge: 2017-07-21 | Discharge status: Against Medical Advice | Payer: Self-pay

## 2017-07-21 NOTE — ED Notes (Signed)
No answer when called for triage x 3.  

## 2017-07-21 NOTE — ED Notes (Signed)
Pt called for triage, no response from lobby 

## 2017-07-21 NOTE — ED Notes (Signed)
Pt called for triage x2, no response from lobby

## 2017-07-22 ENCOUNTER — Emergency Department (HOSPITAL_COMMUNITY): Payer: Self-pay

## 2017-07-22 ENCOUNTER — Encounter (HOSPITAL_COMMUNITY): Payer: Self-pay

## 2017-07-22 ENCOUNTER — Emergency Department (HOSPITAL_COMMUNITY)
Admission: EM | Admit: 2017-07-22 | Discharge: 2017-07-22 | Disposition: A | Discharge status: Home / Self Care | Payer: Self-pay | Attending: Emergency Medicine | Admitting: Emergency Medicine

## 2017-07-22 ENCOUNTER — Encounter (HOSPITAL_BASED_OUTPATIENT_CLINIC_OR_DEPARTMENT_OTHER): Payer: Self-pay | Admitting: *Deleted

## 2017-07-22 ENCOUNTER — Other Ambulatory Visit: Payer: Self-pay | Admitting: Orthopedic Surgery

## 2017-07-22 ENCOUNTER — Other Ambulatory Visit: Payer: Self-pay

## 2017-07-22 DIAGNOSIS — Z79899 Other long term (current) drug therapy: Secondary | ICD-10-CM | POA: Diagnosis not present

## 2017-07-22 DIAGNOSIS — Y999 Unspecified external cause status: Secondary | ICD-10-CM | POA: Insufficient documentation

## 2017-07-22 DIAGNOSIS — Z87891 Personal history of nicotine dependence: Secondary | ICD-10-CM | POA: Diagnosis not present

## 2017-07-22 DIAGNOSIS — S62614A Displaced fracture of proximal phalanx of right ring finger, initial encounter for closed fracture: Secondary | ICD-10-CM | POA: Diagnosis present

## 2017-07-22 DIAGNOSIS — S2232XA Fracture of one rib, left side, initial encounter for closed fracture: Secondary | ICD-10-CM | POA: Insufficient documentation

## 2017-07-22 DIAGNOSIS — J45909 Unspecified asthma, uncomplicated: Secondary | ICD-10-CM | POA: Insufficient documentation

## 2017-07-22 DIAGNOSIS — E119 Type 2 diabetes mellitus without complications: Secondary | ICD-10-CM | POA: Diagnosis not present

## 2017-07-22 DIAGNOSIS — W010XXA Fall on same level from slipping, tripping and stumbling without subsequent striking against object, initial encounter: Secondary | ICD-10-CM | POA: Diagnosis not present

## 2017-07-22 DIAGNOSIS — Y929 Unspecified place or not applicable: Secondary | ICD-10-CM | POA: Diagnosis not present

## 2017-07-22 DIAGNOSIS — Z7984 Long term (current) use of oral hypoglycemic drugs: Secondary | ICD-10-CM | POA: Diagnosis not present

## 2017-07-22 DIAGNOSIS — W109XXA Fall (on) (from) unspecified stairs and steps, initial encounter: Secondary | ICD-10-CM | POA: Insufficient documentation

## 2017-07-22 DIAGNOSIS — Z8632 Personal history of gestational diabetes: Secondary | ICD-10-CM | POA: Insufficient documentation

## 2017-07-22 DIAGNOSIS — S62620A Displaced fracture of medial phalanx of right index finger, initial encounter for closed fracture: Secondary | ICD-10-CM | POA: Insufficient documentation

## 2017-07-22 DIAGNOSIS — Y9389 Activity, other specified: Secondary | ICD-10-CM | POA: Insufficient documentation

## 2017-07-22 MED ORDER — NAPROXEN 500 MG PO TABS: 500.0000 mg | ORAL_TABLET | Freq: Two times a day (BID) | ORAL | 0 refills | Status: DC

## 2017-07-22 MED ORDER — TRAMADOL HCL 50 MG PO TABS: 50.0000 mg | ORAL_TABLET | Freq: Four times a day (QID) | ORAL | 0 refills | Status: DC | PRN

## 2017-07-22 MED ORDER — NAPROXEN 250 MG PO TABS
500.0000 mg | ORAL_TABLET | Freq: Once | ORAL | Status: AC
  Administered 2017-07-22: 500 mg via ORAL
  Filled 2017-07-22: qty 2

## 2017-07-22 NOTE — Consult Note (Signed)
ORTHOPAEDIC CONSULTATION HISTORY & PHYSICAL REQUESTING PHYSICIAN: Wanda Ferguson, MD  Chief Complaint: right ring finger injury  HPI: Wanda Harris is a 37 y.o. female who injured her right ring finger this past Saturday nightas a consequence of a mechanical fall.  Due to continued pain and swelling, with some degree of deformity,she presented today for evaluation.  She is employed as an Sales promotion account executive.  Past Medical History:  Diagnosis Date  . Asthma    does not use inhaler  . Diabetes mellitus without complication (HCC)    GDM  . Gestational diabetes    G2 & G5, takes insulin   Past Surgical History:  Procedure Laterality Date  . CESAREAN SECTION N/A 07/01/2013   Procedure: Primary Cesarean Section Delivery Baby Boy @ 2038, Apgars 7/9;  Surgeon: Wanda Kind, MD;  Location: [facility];  Service: Obstetrics;  Laterality: N/A;  . CESAREAN SECTION WITH BILATERAL TUBAL LIGATION Bilateral 11/28/2015   Procedure: CESAREAN SECTION WITH BILATERAL TUBAL LIGATION;  Surgeon: Wanda Mode, MD;  Location: [facility];  Service: Obstetrics;  Laterality: Bilateral;   Social History   Socioeconomic History  . Marital status: Divorced    Spouse name: None  . Number of children: None  . Years of education: None  . Highest education level: None  Social Needs  . Financial resource strain: None  . Food insecurity - worry: None  . Food insecurity - inability: None  . Transportation needs - medical: None  . Transportation needs - non-medical: None  Occupational History  . None  Tobacco Use  . Smoking status: Former Smoker    Last attempt to quit: 05/02/2015    Years since quitting: 2.2  . Smokeless tobacco: Never Used  Substance and Sexual Activity  . Alcohol use: No  . Drug use: No  . Sexual activity: Yes    Birth control/protection: None  Other Topics Concern  . None  Social History Narrative  . None   Family History  Problem Relation Age of Onset  . Diabetes Mother     . Hypertension Mother   . Arthritis Mother   . Hyperlipidemia Father   . Heart disease Father   . Stroke Father    No Known Allergies Prior to Admission medications   Medication Sig Start Date End Date Taking? Authorizing Provider  acetaminophen (TYLENOL) 325 MG tablet Take 2 tablets (650 mg total) by mouth every 6 (six) hours as needed (for pain scale < 4). 11/30/15   Wouk, Ailene Rud, MD  metFORMIN (GLUCOPHAGE) 500 MG tablet Take 1 tablet (500 mg total) by mouth 2 (two) times daily with a meal. 01/12/16   Mumaw, Lauralyn Primes, DO  metoCLOPramide (REGLAN) 10 MG tablet Take 1 tablet (10 mg total) by mouth every 6 (six) hours as needed for nausea. 07/10/16   Tegeler, Gwenyth Allegra, MD  naproxen sodium (ANAPROX) 220 MG tablet Take 220 mg by mouth 2 (two) times daily as needed (pain).    [provider]   Dg Ribs Unilateral W/chest Left  Result Date: 07/22/2017 CLINICAL DATA:  Slipped and fell on Saturday night while wearing high heels, LEFT anterior rib pain EXAM: LEFT RIBS AND CHEST - 3+ VIEW COMPARISON:  Chest radiograph 04/06/2011 FINDINGS: Normal heart size, mediastinal contours, and pulmonary vascularity. Lungs clear. No acute infiltrate, pleural effusion, or pneumothorax. Osseous mineralization normal. BB placed at site of symptoms lower lateral LEFT chest. Minimally displaced fracture of the lateral LEFT ninth rib identified. IMPRESSION: Minimally displaced  fracture lateral LEFT ninth rib. Electronically Signed   By: Wanda Harris M.D.   On: 07/22/2017 10:02   Dg Finger Ring Right  Result Date: 07/22/2017 CLINICAL DATA:  37 year old female status post slip and fall 2 days ago. Soft tissue wound and pain in the right ring finger. EXAM: RIGHT RING FINGER 2+V COMPARISON:  None. FINDINGS: Oblique intra-articular fracture through the head of the right 4th middle phalanx extending to the 4th DIP. There is minimal displacement. The 4th distal and proximal phalanges appear intact. Normal  4th PIP. Regional soft tissue swelling. No soft tissue gas identified. IMPRESSION: Oblique intra-articular fracture through the head of the right 4th middle phalanx into the 4th DIP with minimal displacement. Electronically Signed   By: Wanda Harris M.D.   On: 07/22/2017 10:03    Positive ROS: All other systems have been reviewed and were otherwise negative with the exception of those mentioned in the HPI and as above.  Physical Exam: Vitals: Refer to EMR. Constitutional:  WD, WN, NAD HEENT:  NCAT, EOMI Neuro/Psych:  Alert & oriented to person, place, and time; appropriate mood & affect Lymphatic: No generalized extremity edema or lymphadenopathy Extremities / MSK:  The extremities are normal with respect to appearance, ranges of motion, joint stability, muscle strength/tone, sensation, & perfusion except as otherwise noted:  Right ring finger has subtle deformity noted at the level of the DIP joint, flexor and extensor tendons intact, grossly not unstable.  Swelling and bruising noted  Assessment: Right ring finger fracture of the middle phalanx distally, with intra-articular extension.  This is a unicondylar fracture, oblique in nature, with slight angular deformity noted already  Plan: I discussed these findings with her.  We reviewed the degree of deformity present, and the prediction that it would continue to change slowly, ultimately resting likely with greater deformity that is present.  In addition, this would result in some intra-articular incongruity.  I offered the opportunity for reduction and stabilization, hopefully with percutaneous pinning tomorrow afternoon and she consented.  She is already splinted, and can remain so until surgery.  [name], [place], [place]  [id number] Office: [phone] Mobile: [phone]  07/22/2017, 12:05 PM

## 2017-07-22 NOTE — Pre-Procedure Instructions (Signed)
[name] will be interpreter for pt's husband post-op, per Bethena Roys at [facility] for [facility]; please call [phone] if surgery time changes.

## 2017-07-22 NOTE — ED Provider Notes (Addendum)
[facility] EMERGENCY DEPARTMENT Provider Note   CSN: [id number] Arrival date & time: 07/22/17  0848     History   Chief Complaint Chief Complaint  Patient presents with  . fall/rib pain    HPI Wanda Harris is a 37 y.o. female with a hx of DM who presents to the ED s/p mechanical fall 2 days prior complaining of R 4th finger pain and L rib pain. Patient states she was ambulating in heels up the stairs when she miss-stepped. States she grabbed her husband with the R hand and the 4th finger got caught in his belt and she then fell onto her L side. No head injury or LOC. She states she is having pain that is a 10/10 in severity, worse with deep breaths and movement. Has tried aleve without relief. Denies numbness, weakness, or dyspnea. Patient is R hand dominant.  HPI  Past Medical History:  Diagnosis Date  . Asthma    does not use inhaler  . Diabetes mellitus without complication (HCC)    GDM  . Gestational diabetes    G2 & G5, takes insulin    Patient Active Problem List   Diagnosis Date Noted  . Postpartum care following cesarean delivery 01/12/2016  . Vaginal burning 01/12/2016  . Constipation 01/12/2016  . Gestational diabetes mellitus (GDM), postpartum 01/12/2016  . S/P repeat low transverse C-section 11/28/2015  . Fetal macrosomia, delivered, current hospitalization 11/28/2015  . Pyelonephritis affecting pregnancy in third trimester, antepartum 10/21/2015  . Trichomonal vaginitis in pregnancy 08/06/2015  . Supervision of young multigravida, antepartum 08/04/2015  . GBS (group B streptococcus) UTI complicating pregnancy 93/81/0175  . Gestational diabetes mellitus (GDM) affecting pregnancy 08/04/2015  . Uterine scar from previous surgery affecting pregnancy 08/04/2015    Past Surgical History:  Procedure Laterality Date  . CESAREAN SECTION N/A 07/01/2013   Procedure: Primary Cesarean Section Delivery Baby Boy @ 2038, Apgars 7/9;  Surgeon:  Jonnie Kind, MD;  Location: [facility];  Service: Obstetrics;  Laterality: N/A;  . CESAREAN SECTION WITH BILATERAL TUBAL LIGATION Bilateral 11/28/2015   Procedure: CESAREAN SECTION WITH BILATERAL TUBAL LIGATION;  Surgeon: Woodroe Mode, MD;  Location: [facility];  Service: Obstetrics;  Laterality: Bilateral;    OB History    Gravida Para Term Preterm AB Living   6 5 5   1 5    SAB TAB Ectopic Multiple Live Births   1 0   0 5       Home Medications    Prior to Admission medications   Medication Sig Start Date End Date Taking? Authorizing Provider  acetaminophen (TYLENOL) 325 MG tablet Take 2 tablets (650 mg total) by mouth every 6 (six) hours as needed (for pain scale < 4). 11/30/15   Wouk, Ailene Rud, MD  metFORMIN (GLUCOPHAGE) 500 MG tablet Take 1 tablet (500 mg total) by mouth 2 (two) times daily with a meal. 01/12/16   Mumaw, Lauralyn Primes, DO  metoCLOPramide (REGLAN) 10 MG tablet Take 1 tablet (10 mg total) by mouth every 6 (six) hours as needed for nausea. 07/10/16   Tegeler, Gwenyth Allegra, MD  naproxen sodium (ANAPROX) 220 MG tablet Take 220 mg by mouth 2 (two) times daily as needed (pain).    [provider]    Family History Family History  Problem Relation Age of Onset  . Diabetes Mother   . Hypertension Mother   . Arthritis Mother   . Hyperlipidemia Father   . Heart  disease Father   . Stroke Father     Social History Social History   Tobacco Use  . Smoking status: Former Smoker    Last attempt to quit: 05/02/2015    Years since quitting: 2.2  . Smokeless tobacco: Never Used  Substance Use Topics  . Alcohol use: No  . Drug use: No     Allergies   Patient has no known allergies.   Review of Systems Review of Systems  Constitutional: Negative for chills and fever.  Respiratory: Negative for shortness of breath.   Gastrointestinal: Negative for abdominal pain, blood in stool and vomiting.  Genitourinary: Negative for hematuria.    Musculoskeletal: Negative for back pain and neck pain.       Positive for R 4th finger pain and L sided rib pain.   Neurological: Negative for weakness and numbness.    Physical Exam Updated Vital Signs BP 110/61   Pulse 67   Temp 98.1 F (36.7 C) (Oral)   Resp 18   SpO2 100%   Physical Exam  Constitutional: She appears well-developed and well-nourished.  Non-toxic appearance. No distress.  HENT:  Head: Normocephalic and atraumatic.  Eyes: Conjunctivae are normal. Right eye exhibits no discharge. Left eye exhibits no discharge.  Neck: No spinous process tenderness present.  Cardiovascular: Normal rate and regular rhythm.  No murmur heard. Pulses:      Radial pulses are 2+ on the right side, and 2+ on the left side.  Pulmonary/Chest: Breath sounds normal. No respiratory distress. She has no wheezes. She has no rales. She exhibits tenderness (left lower anterolateral ribs). She exhibits no crepitus, no edema and no swelling.  Abdominal: Soft. She exhibits no distension. There is no tenderness.  Musculoskeletal:  Upper extremities: Mild ecchymosis diffusely to the right 4th finger.  No appreciable swelling, obvious deformity, or erythema.  Patient has full range of motion to all upper extremity MCPs, PIPs, and DIPs with the exception of the right 4th finger DIP region being limited secondary to pain.  She is able to fully extend the right 4th finger DIP, she is able to minimally flex at this joint, however not fully.  Patient with tenderness to palpation over the distal middle phalanx, DIP, and proximal PIP.  NVI distally.  Neurological: She is alert.  Clear speech.  Sensation grossly intact to bilateral upper extremities.  Skin: Skin is warm and dry. Capillary refill takes less than 2 seconds. No rash noted.  Psychiatric: She has a normal mood and affect. Her behavior is normal.  Nursing note and vitals reviewed.   ED Treatments / Results  Labs (all labs ordered are listed, but  only abnormal results are displayed) Labs Reviewed - No data to display  EKG  EKG Interpretation None       Radiology Dg Ribs Unilateral W/chest Left  Result Date: 07/22/2017 CLINICAL DATA:  Slipped and fell on Saturday night while wearing high heels, LEFT anterior rib pain EXAM: LEFT RIBS AND CHEST - 3+ VIEW COMPARISON:  Chest radiograph 04/06/2011 FINDINGS: Normal heart size, mediastinal contours, and pulmonary vascularity. Lungs clear. No acute infiltrate, pleural effusion, or pneumothorax. Osseous mineralization normal. BB placed at site of symptoms lower lateral LEFT chest. Minimally displaced fracture of the lateral LEFT ninth rib identified. IMPRESSION: Minimally displaced fracture lateral LEFT ninth rib. Electronically Signed   By: Lavonia Dana M.D.   On: 07/22/2017 10:02   Dg Finger Ring Right  Result Date: 07/22/2017 CLINICAL DATA:  37 year old female status post  slip and fall 2 days ago. Soft tissue wound and pain in the right ring finger. EXAM: RIGHT RING FINGER 2+V COMPARISON:  None. FINDINGS: Oblique intra-articular fracture through the head of the right 4th middle phalanx extending to the 4th DIP. There is minimal displacement. The 4th distal and proximal phalanges appear intact. Normal 4th PIP. Regional soft tissue swelling. No soft tissue gas identified. IMPRESSION: Oblique intra-articular fracture through the head of the right 4th middle phalanx into the 4th DIP with minimal displacement. Electronically Signed   By: Genevie Ann M.D.   On: 07/22/2017 10:03    Procedures Procedures (including critical care time)  Medications Ordered in ED Medications - No data to display   Initial Impression / Assessment and Plan / ED Course  I have reviewed the triage vital signs and the nursing notes.  Pertinent labs & imaging results that were available during my care of the patient were reviewed by me and considered in my medical decision making (see chart for details).   Patient  presents status post mechanical fall with left rib pain and right 4th finger pain.  She is nontoxic-appearing, vitals are within normal limits.  Imaging revealed left ninth lateral rib fracture which is minimally displaced as well as an intra-articular fracture through the head of the right fourth middle phalanx into the fourth DIP with minimal displacement.  She is without dyspnea, she is moving air appropriately, however she is uncomfortable with a deep breath. Will treat with Naproxen in the ED. Respiratory therapist consulted and instructed patient on incentive spirometry use. I discussed need for use of this to prevent complications of the rib fracture such as pneumonia. Patient is neurovascularly intact distal to her right 4th finger fracture. Will consult hand surgery. Findings and plan of care discussed with supervising physician Dr. Roderic Palau who is in agreement with plan.   10:52: CONSULT: Discussed case with Hilbert Odor PA-C, orthopedics, who will discuss with hand surgeon Dr. Grandville Silos.  11:10: CONSULT: Discussed case with  Hilbert Odor PA-C who requested patient remain in the ED for Dr. Grandville Silos to come and evaluate her.   12:05: CONSULT: Patient seen by Dr. Grandville Silos- instructed DC home, his office will call her today in order to set up surgery for tomorrow afternoon.   Patient's R 4th finger placed in splint. She was given incentive spirometry for rib fracture. Will treat pain with prescriptions for Naproxen and Tramadol, [place] Controlled Substance reporting System queried. I discussed results, treatment plan, need for PCP and hand surgery follow-up, and return precautions with the patient. Provided opportunity for questions, patient confirmed understanding and is in agreement with plan.    Final Clinical Impressions(s) / ED Diagnoses   Final diagnoses:  Closed fracture of one rib of left side, initial encounter  Closed displaced fracture of middle phalanx of right index  finger, initial encounter    ED Discharge Orders        Ordered    traMADol (ULTRAM) 50 MG tablet  Every 6 hours PRN     07/22/17 1212    naproxen (NAPROSYN) 500 MG tablet  2 times daily     07/22/17 [address], [place], PA-C 07/22/17 1223    Milton Ferguson, MD 07/22/17 1301    Peace Noyes R, PA-C 07/22/17 1400  Note was updated and re-signed to indicate 4th right digit for proper digit specification.     Amaryllis Dyke, PA-C 07/22/17 1401    [place],  Broadus John, MD 07/27/17 1154

## 2017-07-22 NOTE — ED Triage Notes (Signed)
Patient slipped in high heels Saturday and fell on left side on steps, no loc but pain with inspiration. Also pain to right hand index finger

## 2017-07-22 NOTE — Discharge Instructions (Signed)
You were seen in the emergency department today and found to have a fracture of your rib as well as a fracture of your right index finger.  Continue to use the incentive spirometer 4-5 times per day as directed by the respiratory therapist.  We have prescribed you 2 medications today: - Naproxen is a nonsteroidal anti-inflammatory medication that will help with pain and swelling. Be sure to take this medication as prescribed with food, 1 pill every 12 hours,  It should be taken with food, as it can cause stomach upset, and more seriously, stomach bleeding. Do not take other nonsteroidal anti-inflammatory medications with this such as Advil, Motrin, or Aleve.  - Tramadol- this is a stronger pain medication that he can take once every 6 hours as needed for severe pain.  You may not drive or operate heavy machinery when taking this.  Follow-up with Dr. Carolann Littler office -as discussed they will call you today in order to set up surgical procedure for tomorrow.  Department for any new or worsening symptoms including but not limited to difficulty breathing, worsening pain, or any other concerns you may have.

## 2017-07-22 NOTE — H&P (View-Only) (Signed)
ORTHOPAEDIC CONSULTATION HISTORY & PHYSICAL REQUESTING PHYSICIAN: Milton Ferguson, MD  Chief Complaint: right ring finger injury  HPI: Wanda Harris is a 37 y.o. female who injured her right ring finger this past Saturday nightas a consequence of a mechanical fall.  Due to continued pain and swelling, with some degree of deformity,she presented today for evaluation.  She is employed as an Sales promotion account executive.  Past Medical History:  Diagnosis Date  . Asthma    does not use inhaler  . Diabetes mellitus without complication (HCC)    GDM  . Gestational diabetes    G2 & G5, takes insulin   Past Surgical History:  Procedure Laterality Date  . CESAREAN SECTION N/A 07/01/2013   Procedure: Primary Cesarean Section Delivery Baby Boy @ 2038, Apgars 7/9;  Surgeon: Jonnie Kind, MD;  Location: [facility];  Service: Obstetrics;  Laterality: N/A;  . CESAREAN SECTION WITH BILATERAL TUBAL LIGATION Bilateral 11/28/2015   Procedure: CESAREAN SECTION WITH BILATERAL TUBAL LIGATION;  Surgeon: Woodroe Mode, MD;  Location: [facility];  Service: Obstetrics;  Laterality: Bilateral;   Social History   Socioeconomic History  . Marital status: Divorced    Spouse name: None  . Number of children: None  . Years of education: None  . Highest education level: None  Social Needs  . Financial resource strain: None  . Food insecurity - worry: None  . Food insecurity - inability: None  . Transportation needs - medical: None  . Transportation needs - non-medical: None  Occupational History  . None  Tobacco Use  . Smoking status: Former Smoker    Last attempt to quit: 05/02/2015    Years since quitting: 2.2  . Smokeless tobacco: Never Used  Substance and Sexual Activity  . Alcohol use: No  . Drug use: No  . Sexual activity: Yes    Birth control/protection: None  Other Topics Concern  . None  Social History Narrative  . None   Family History  Problem Relation Age of Onset  . Diabetes Mother     . Hypertension Mother   . Arthritis Mother   . Hyperlipidemia Father   . Heart disease Father   . Stroke Father    No Known Allergies Prior to Admission medications   Medication Sig Start Date End Date Taking? Authorizing Provider  acetaminophen (TYLENOL) 325 MG tablet Take 2 tablets (650 mg total) by mouth every 6 (six) hours as needed (for pain scale < 4). 11/30/15   Wouk, Ailene Rud, MD  metFORMIN (GLUCOPHAGE) 500 MG tablet Take 1 tablet (500 mg total) by mouth 2 (two) times daily with a meal. 01/12/16   Mumaw, Lauralyn Primes, DO  metoCLOPramide (REGLAN) 10 MG tablet Take 1 tablet (10 mg total) by mouth every 6 (six) hours as needed for nausea. 07/10/16   Tegeler, Gwenyth Allegra, MD  naproxen sodium (ANAPROX) 220 MG tablet Take 220 mg by mouth 2 (two) times daily as needed (pain).    [provider]   Dg Ribs Unilateral W/chest Left  Result Date: 07/22/2017 CLINICAL DATA:  Slipped and fell on Saturday night while wearing high heels, LEFT anterior rib pain EXAM: LEFT RIBS AND CHEST - 3+ VIEW COMPARISON:  Chest radiograph 04/06/2011 FINDINGS: Normal heart size, mediastinal contours, and pulmonary vascularity. Lungs clear. No acute infiltrate, pleural effusion, or pneumothorax. Osseous mineralization normal. BB placed at site of symptoms lower lateral LEFT chest. Minimally displaced fracture of the lateral LEFT ninth rib identified. IMPRESSION: Minimally displaced  fracture lateral LEFT ninth rib. Electronically Signed   By: Lavonia Dana M.D.   On: 07/22/2017 10:02   Dg Finger Ring Right  Result Date: 07/22/2017 CLINICAL DATA:  37 year old female status post slip and fall 2 days ago. Soft tissue wound and pain in the right ring finger. EXAM: RIGHT RING FINGER 2+V COMPARISON:  None. FINDINGS: Oblique intra-articular fracture through the head of the right 4th middle phalanx extending to the 4th DIP. There is minimal displacement. The 4th distal and proximal phalanges appear intact. Normal  4th PIP. Regional soft tissue swelling. No soft tissue gas identified. IMPRESSION: Oblique intra-articular fracture through the head of the right 4th middle phalanx into the 4th DIP with minimal displacement. Electronically Signed   By: Genevie Ann M.D.   On: 07/22/2017 10:03    Positive ROS: All other systems have been reviewed and were otherwise negative with the exception of those mentioned in the HPI and as above.  Physical Exam: Vitals: Refer to EMR. Constitutional:  WD, WN, NAD HEENT:  NCAT, EOMI Neuro/Psych:  Alert & oriented to person, place, and time; appropriate mood & affect Lymphatic: No generalized extremity edema or lymphadenopathy Extremities / MSK:  The extremities are normal with respect to appearance, ranges of motion, joint stability, muscle strength/tone, sensation, & perfusion except as otherwise noted:  Right ring finger has subtle deformity noted at the level of the DIP joint, flexor and extensor tendons intact, grossly not unstable.  Swelling and bruising noted  Assessment: Right ring finger fracture of the middle phalanx distally, with intra-articular extension.  This is a unicondylar fracture, oblique in nature, with slight angular deformity noted already  Plan: I discussed these findings with her.  We reviewed the degree of deformity present, and the prediction that it would continue to change slowly, ultimately resting likely with greater deformity that is present.  In addition, this would result in some intra-articular incongruity.  I offered the opportunity for reduction and stabilization, hopefully with percutaneous pinning tomorrow afternoon and she consented.  She is already splinted, and can remain so until surgery.  [name], [place], [place]  [id number] Office: [phone] Mobile: [phone]  07/22/2017, 12:05 PM

## 2017-07-23 ENCOUNTER — Ambulatory Visit (HOSPITAL_BASED_OUTPATIENT_CLINIC_OR_DEPARTMENT_OTHER): Payer: PRIVATE HEALTH INSURANCE | Admitting: Anesthesiology

## 2017-07-23 ENCOUNTER — Ambulatory Visit (HOSPITAL_COMMUNITY): Payer: PRIVATE HEALTH INSURANCE

## 2017-07-23 ENCOUNTER — Encounter (HOSPITAL_BASED_OUTPATIENT_CLINIC_OR_DEPARTMENT_OTHER)
Admission: RE | Disposition: A | Discharge status: Home / Self Care | Payer: Self-pay | Source: Ambulatory Visit | Attending: Orthopedic Surgery

## 2017-07-23 ENCOUNTER — Encounter (HOSPITAL_BASED_OUTPATIENT_CLINIC_OR_DEPARTMENT_OTHER): Payer: Self-pay | Admitting: *Deleted

## 2017-07-23 ENCOUNTER — Ambulatory Visit (HOSPITAL_BASED_OUTPATIENT_CLINIC_OR_DEPARTMENT_OTHER)
Admission: RE | Admit: 2017-07-23 | Discharge: 2017-07-23 | Disposition: A | Discharge status: Home / Self Care | Payer: PRIVATE HEALTH INSURANCE | Source: Ambulatory Visit | Attending: Orthopedic Surgery | Admitting: Orthopedic Surgery

## 2017-07-23 ENCOUNTER — Other Ambulatory Visit: Payer: Self-pay

## 2017-07-23 DIAGNOSIS — S62614A Displaced fracture of proximal phalanx of right ring finger, initial encounter for closed fracture: Secondary | ICD-10-CM | POA: Diagnosis not present

## 2017-07-23 DIAGNOSIS — Z419 Encounter for procedure for purposes other than remedying health state, unspecified: Secondary | ICD-10-CM

## 2017-07-23 DIAGNOSIS — J45909 Unspecified asthma, uncomplicated: Secondary | ICD-10-CM | POA: Insufficient documentation

## 2017-07-23 DIAGNOSIS — Z7984 Long term (current) use of oral hypoglycemic drugs: Secondary | ICD-10-CM | POA: Insufficient documentation

## 2017-07-23 DIAGNOSIS — Y929 Unspecified place or not applicable: Secondary | ICD-10-CM | POA: Insufficient documentation

## 2017-07-23 DIAGNOSIS — W010XXA Fall on same level from slipping, tripping and stumbling without subsequent striking against object, initial encounter: Secondary | ICD-10-CM | POA: Insufficient documentation

## 2017-07-23 DIAGNOSIS — D649 Anemia, unspecified: Secondary | ICD-10-CM | POA: Insufficient documentation

## 2017-07-23 DIAGNOSIS — Z87891 Personal history of nicotine dependence: Secondary | ICD-10-CM | POA: Insufficient documentation

## 2017-07-23 DIAGNOSIS — Z79899 Other long term (current) drug therapy: Secondary | ICD-10-CM | POA: Insufficient documentation

## 2017-07-23 DIAGNOSIS — E119 Type 2 diabetes mellitus without complications: Secondary | ICD-10-CM | POA: Insufficient documentation

## 2017-07-23 HISTORY — DX: Fracture of unspecified phalanx of unspecified finger, initial encounter for closed fracture: S62.609A

## 2017-07-23 HISTORY — DX: Type 2 diabetes mellitus without complications: E11.9

## 2017-07-23 HISTORY — DX: Fracture of one rib, left side, initial encounter for closed fracture: S22.32XA

## 2017-07-23 HISTORY — DX: Migraine, unspecified, not intractable, without status migrainosus: G43.909

## 2017-07-23 HISTORY — PX: OPEN REDUCTION INTERNAL FIXATION (ORIF) PROXIMAL PHALANX: SHX6235

## 2017-07-23 LAB — POCT I-STAT, CHEM 8
BUN: 21 mg/dL — ABNORMAL HIGH (ref 6–20)
Calcium, Ion: 1.16 mmol/L (ref 1.15–1.40)
Chloride: 104 mmol/L (ref 101–111)
Creatinine, Ser: 0.4 mg/dL — ABNORMAL LOW (ref 0.44–1.00)
Glucose, Bld: 192 mg/dL — ABNORMAL HIGH (ref 65–99)
HCT: 35 % — ABNORMAL LOW (ref 36.0–46.0)
Hemoglobin: 11.9 g/dL — ABNORMAL LOW (ref 12.0–15.0)
Potassium: 3.8 mmol/L (ref 3.5–5.1)
Sodium: 140 mmol/L (ref 135–145)
TCO2: 22 mmol/L (ref 22–32)

## 2017-07-23 LAB — GLUCOSE, CAPILLARY: Glucose-Capillary: 171 mg/dL — ABNORMAL HIGH (ref 65–99)

## 2017-07-23 SURGERY — OPEN REDUCTION INTERNAL FIXATION (ORIF) PROXIMAL PHALANX
Anesthesia: Monitor Anesthesia Care | Site: Finger | Laterality: Right

## 2017-07-23 MED ORDER — PROPOFOL 500 MG/50ML IV EMUL
INTRAVENOUS | Status: DC | PRN
  Administered 2017-07-23: 75 ug/kg/min via INTRAVENOUS

## 2017-07-23 MED ORDER — TRAMADOL HCL 50 MG PO TABS: 50.0000 mg | ORAL_TABLET | Freq: Four times a day (QID) | ORAL | 1 refills | Status: DC | PRN

## 2017-07-23 MED ORDER — PROMETHAZINE HCL 25 MG/ML IJ SOLN
6.2500 mg | INTRAMUSCULAR | Status: DC | PRN
Start: 2017-07-23 — End: 2017-07-23

## 2017-07-23 MED ORDER — LACTATED RINGERS IV SOLN: INTRAVENOUS | Status: DC

## 2017-07-23 MED ORDER — CEFAZOLIN SODIUM-DEXTROSE 2-4 GM/100ML-% IV SOLN
INTRAVENOUS | Status: AC
  Filled 2017-07-23: qty 100

## 2017-07-23 MED ORDER — FENTANYL CITRATE (PF) 100 MCG/2ML IJ SOLN
50.0000 ug | INTRAMUSCULAR | Status: DC | PRN
  Administered 2017-07-23: 100 ug via INTRAVENOUS

## 2017-07-23 MED ORDER — CEFAZOLIN SODIUM-DEXTROSE 2-4 GM/100ML-% IV SOLN
2.0000 g | INTRAVENOUS | Status: AC
  Administered 2017-07-23: 2 g via INTRAVENOUS

## 2017-07-23 MED ORDER — MIDAZOLAM HCL 2 MG/2ML IJ SOLN
INTRAMUSCULAR | Status: AC
  Filled 2017-07-23: qty 2

## 2017-07-23 MED ORDER — ONDANSETRON HCL 4 MG/2ML IJ SOLN
INTRAMUSCULAR | Status: DC | PRN
  Administered 2017-07-23: 4 mg via INTRAVENOUS

## 2017-07-23 MED ORDER — PROPOFOL 500 MG/50ML IV EMUL
INTRAVENOUS | Status: AC
  Filled 2017-07-23: qty 50

## 2017-07-23 MED ORDER — PROPOFOL 10 MG/ML IV BOLUS
INTRAVENOUS | Status: DC | PRN
  Administered 2017-07-23: 80 mg via INTRAVENOUS

## 2017-07-23 MED ORDER — BUPIVACAINE-EPINEPHRINE 0.5% -1:200000 IJ SOLN
INTRAMUSCULAR | Status: DC | PRN
  Administered 2017-07-23: 4 mL

## 2017-07-23 MED ORDER — FENTANYL CITRATE (PF) 100 MCG/2ML IJ SOLN
INTRAMUSCULAR | Status: AC
  Filled 2017-07-23: qty 2

## 2017-07-23 MED ORDER — BUPIVACAINE-EPINEPHRINE (PF) 0.5% -1:200000 IJ SOLN
INTRAMUSCULAR | Status: AC
  Filled 2017-07-23: qty 30

## 2017-07-23 MED ORDER — LIDOCAINE HCL (PF) 1 % IJ SOLN
INTRAMUSCULAR | Status: DC | PRN
  Administered 2017-07-23: 3 mL

## 2017-07-23 MED ORDER — ACETAMINOPHEN 325 MG PO TABS: 650.0000 mg | ORAL_TABLET | Freq: Four times a day (QID) | ORAL | Status: DC

## 2017-07-23 MED ORDER — ONDANSETRON HCL 4 MG/2ML IJ SOLN
INTRAMUSCULAR | Status: AC
Start: 2017-07-23 — End: ?
  Filled 2017-07-23: qty 2

## 2017-07-23 MED ORDER — LIDOCAINE HCL (PF) 1 % IJ SOLN
INTRAMUSCULAR | Status: AC
  Filled 2017-07-23: qty 30

## 2017-07-23 MED ORDER — MIDAZOLAM HCL 2 MG/2ML IJ SOLN
1.0000 mg | INTRAMUSCULAR | Status: DC | PRN
  Administered 2017-07-23: 2 mg via INTRAVENOUS

## 2017-07-23 MED ORDER — HYDROMORPHONE HCL 1 MG/ML IJ SOLN: 0.2500 mg | INTRAMUSCULAR | Status: DC | PRN

## 2017-07-23 MED ORDER — SCOPOLAMINE 1 MG/3DAYS TD PT72: 1.0000 | MEDICATED_PATCH | Freq: Once | TRANSDERMAL | Status: DC | PRN

## 2017-07-23 SURGICAL SUPPLY — 54 items
BANDAGE COBAN STERILE 2 (GAUZE/BANDAGES/DRESSINGS) IMPLANT
BLADE MINI RND TIP GREEN BEAV (BLADE) IMPLANT
BLADE SURG 15 STRL LF DISP TIS (BLADE) ×1 IMPLANT
BLADE SURG 15 STRL SS (BLADE) ×2
BNDG CMPR 9X4 STRL LF SNTH (GAUZE/BANDAGES/DRESSINGS)
BNDG COHESIVE 1X5 TAN STRL LF (GAUZE/BANDAGES/DRESSINGS) ×1 IMPLANT
BNDG COHESIVE 4X5 TAN STRL (GAUZE/BANDAGES/DRESSINGS) ×2 IMPLANT
BNDG ESMARK 4X9 LF (GAUZE/BANDAGES/DRESSINGS) IMPLANT
BNDG GAUZE ELAST 4 BULKY (GAUZE/BANDAGES/DRESSINGS) ×2 IMPLANT
CAP PIN PROTECTOR ORTHO WHT (CAP) IMPLANT
CHLORAPREP W/TINT 26ML (MISCELLANEOUS) ×2 IMPLANT
CORD BIPOLAR FORCEPS 12FT (ELECTRODE) ×2 IMPLANT
COVER BACK TABLE 60X90IN (DRAPES) ×2 IMPLANT
COVER MAYO STAND STRL (DRAPES) ×2 IMPLANT
CUFF TOURNIQUET SINGLE 18IN (TOURNIQUET CUFF) ×1 IMPLANT
DRAPE C-ARM 42X72 X-RAY (DRAPES) ×2 IMPLANT
DRAPE EXTREMITY T 121X128X90 (DRAPE) ×2 IMPLANT
DRAPE SURG 17X23 STRL (DRAPES) ×2 IMPLANT
DRSG EMULSION OIL 3X3 NADH (GAUZE/BANDAGES/DRESSINGS) ×2 IMPLANT
GAUZE SPONGE 4X4 12PLY STRL LF (GAUZE/BANDAGES/DRESSINGS) ×2 IMPLANT
GLOVE BIO SURGEON STRL SZ7.5 (GLOVE) ×2 IMPLANT
GLOVE BIOGEL PI IND STRL 7.0 (GLOVE) ×1 IMPLANT
GLOVE BIOGEL PI IND STRL 8 (GLOVE) ×1 IMPLANT
GLOVE BIOGEL PI INDICATOR 7.0 (GLOVE) ×2
GLOVE BIOGEL PI INDICATOR 8 (GLOVE) ×1
GLOVE ECLIPSE 6.5 STRL STRAW (GLOVE) ×2 IMPLANT
GOWN STRL REUS W/ TWL LRG LVL3 (GOWN DISPOSABLE) ×2 IMPLANT
GOWN STRL REUS W/TWL LRG LVL3 (GOWN DISPOSABLE) ×2
GOWN STRL REUS W/TWL XL LVL3 (GOWN DISPOSABLE) ×3 IMPLANT
K-WIRE .045X4 (WIRE) ×1 IMPLANT
K-WIRE 0.35X4 (WIRE) ×2
K-WIRE 9  SMOOTH .045 (WIRE) IMPLANT
KWIRE 0.35X4 (WIRE) IMPLANT
NEEDLE HYPO 25X1 1.5 SAFETY (NEEDLE) ×2 IMPLANT
NS IRRIG 1000ML POUR BTL (IV SOLUTION) ×1 IMPLANT
PACK BASIN DAY SURGERY FS (CUSTOM PROCEDURE TRAY) ×2 IMPLANT
PADDING CAST ABS 4INX4YD NS (CAST SUPPLIES) ×1
PADDING CAST ABS COTTON 4X4 ST (CAST SUPPLIES) IMPLANT
RUBBERBAND STERILE (MISCELLANEOUS) ×1 IMPLANT
SPLINT PLASTER CAST XFAST 3X15 (CAST SUPPLIES) IMPLANT
SPLINT PLASTER XTRA FASTSET 3X (CAST SUPPLIES) ×8
STOCKINETTE 6  STRL (DRAPES) ×1
STOCKINETTE 6 STRL (DRAPES) ×1 IMPLANT
STRIP CLOSURE SKIN 1/4X4 (GAUZE/BANDAGES/DRESSINGS) ×1 IMPLANT
SUCTION FRAZIER HANDLE 10FR (MISCELLANEOUS)
SUCTION TUBE FRAZIER 10FR DISP (MISCELLANEOUS) IMPLANT
SUT VICRYL RAPIDE 4-0 (SUTURE) ×1 IMPLANT
SUT VICRYL RAPIDE 4/0 PS 2 (SUTURE) IMPLANT
SYR 10ML LL (SYRINGE) ×2 IMPLANT
SYR BULB 3OZ (MISCELLANEOUS) IMPLANT
TOWEL OR 17X24 6PK STRL BLUE (TOWEL DISPOSABLE) ×2 IMPLANT
TOWEL OR NON WOVEN STRL DISP B (DISPOSABLE) ×2 IMPLANT
TUBE CONNECTING 20X1/4 (TUBING) IMPLANT
UNDERPAD 30X30 (UNDERPADS AND DIAPERS) ×2 IMPLANT

## 2017-07-23 NOTE — Discharge Instructions (Signed)
Discharge Instructions   You have a dressing with a plaster splint incorporated in it. Move your fingers as much as possible, making a full fist and fully opening the fist. Elevate your hand to reduce pain & swelling of the digits.  Ice over the operative site may be helpful to reduce pain & swelling.  DO NOT USE HEAT. Take Naproxen as prescribed. Take Tylenol 650 mg every 6 hours. Take tramadol as a rescue medicine for severe pain. Leave the dressing in place until you return to our office.  You may shower, but keep the bandage clean & dry.  You may drive a car when you are off of prescription pain medications and can safely control your vehicle with both hands. Call our office to schedule an appointment in 10-15 days from the date of surgery.   Please call [phone] during normal business hours or [phone] after hours for any problems. Including the following:  - excessive redness of the incisions - drainage for more than 4 days - fever of more than 101.5 F  *Please note that pain medications will not be refilled after hours or on weekends.  WORK STATUS: You may return to work on Monday 07/29/2017 with no lifting gripping and grasping greater than pencil and paper tasks. May keyboard.   Post Anesthesia Home Care Instructions  Activity: Get plenty of rest for the remainder of the day. A responsible individual must stay with you for 24 hours following the procedure.  For the next 24 hours, DO NOT: -Drive a car -Paediatric nurse -Drink alcoholic beverages -Take any medication unless instructed by your physician -Make any legal decisions or sign important papers.  Meals: Start with liquid foods such as gelatin or soup. Progress to regular foods as tolerated. Avoid greasy, spicy, heavy foods. If nausea and/or vomiting occur, drink only clear liquids until the nausea and/or vomiting subsides. Call your physician if vomiting continues.  Special Instructions/Symptoms: Your  throat may feel dry or sore from the anesthesia or the breathing tube placed in your throat during surgery. If this causes discomfort, gargle with warm salt water. The discomfort should disappear within 24 hours.  If you had a scopolamine patch placed behind your ear for the management of post- operative nausea and/or vomiting:  1. The medication in the patch is effective for 72 hours, after which it should be removed.  Wrap patch in a tissue and discard in the trash. Wash hands thoroughly with soap and water. 2. You may remove the patch earlier than 72 hours if you experience unpleasant side effects which may include dry mouth, dizziness or visual disturbances. 3. Avoid touching the patch. Wash your hands with soap and water after contact with the patch.

## 2017-07-23 NOTE — Op Note (Signed)
07/23/2017  1:08 PM  PATIENT:  Wanda Harris  37 y.o. female  PRE-OPERATIVE DIAGNOSIS:  R RF P2 fx  POST-OPERATIVE DIAGNOSIS:  Same  PROCEDURE:  CRPP R RF P2 fx  SURGEON: Rayvon Char. Grandville Silos, MD  PHYSICIAN ASSISTANT: Morley Kos, OPA-C  ANESTHESIA:  local and MAC  SPECIMENS:  None  DRAINS:   None  EBL:  less than 50 mL  PREOPERATIVE INDICATIONS:  Wanda Harris is a  37 y.o. female with displaced R RF P2 distal intra-articular fracture  The risks benefits and alternatives were discussed with the patient preoperatively including but not limited to the risks of infection, bleeding, nerve injury, cardiopulmonary complications, the need for revision surgery, among others, and the patient verbalized understanding and consented to proceed.  OPERATIVE IMPLANTS: 0.045 inch K wire and 0.035 inch K wire  OPERATIVE PROCEDURE:  After receiving prophylactic antibiotics, the patient was escorted to the operative theatre and placed in a supine position.  A surgical "time-out" was performed during which the planned procedure, proposed operative site, and the correct patient identity were compared to the operative consent and agreement confirmed by the circulating nurse according to current facility policy.  The right ring finger was anesthetized with a digital block containing Marcaine and lidocaine with epinephrine, instilled by me.  Following application of a tourniquet to the operative extremity, the exposed skin was pre-scrubbed with a Hibiclens scrub brush before being formally prepped with Chloraprep and draped in the usual sterile fashion.    The fracture was provisionally reduced and confirmation was confirmed fluoroscopically.  The reduction was secured by a transverse 0.045 inch K wire and an oblique 0.035 inch K wire.  These were then bent over 9090 at the skin, clipped, and the ends and secured to one another with a Steri-Strip.  The final images were obtained, revealing  anatomic alignment and a splint dressing was applied with a volar aluminum splint component.  She was taken to the recovery room in stable condition.  DISPOSITION: She will be discharged home today with typical instructions, returning the week of 08-05-17, at which time she should get x-rays of the right ring finger out of the splint before the splint is likely reapplied for another 2-3 weeks.

## 2017-07-23 NOTE — Anesthesia Preprocedure Evaluation (Addendum)
Anesthesia Evaluation  Patient identified by MRN, date of birth, ID band Patient awake    Reviewed: Allergy & Precautions, NPO status , Patient's Chart, lab work & pertinent test results  History of Anesthesia Complications Negative for: history of anesthetic complications  Airway Mallampati: II   Neck ROM: Full    Dental  (+) Teeth Intact   Pulmonary asthma , former smoker,    breath sounds clear to auscultation       Cardiovascular negative cardio ROS   Rhythm:Regular Rate:Normal     Neuro/Psych negative neurological ROS  negative psych ROS   GI/Hepatic negative GI ROS, Neg liver ROS,   Endo/Other  diabetes  Renal/GU   negative genitourinary   Musculoskeletal negative musculoskeletal ROS (+)   Abdominal   Peds negative pediatric ROS (+)  Hematology negative hematology ROS (+)   Anesthesia Other Findings   Reproductive/Obstetrics negative OB ROS                             Lab Results  Component Value Date   WBC 8.0 07/10/2016   HGB 11.3 (L) 07/10/2016   HCT 36.0 07/10/2016   MCV 79.5 07/10/2016   PLT 273 07/10/2016   No results found for: INR, PROTIME   Anesthesia Physical  Anesthesia Plan  ASA: II  Anesthesia Plan: MAC   Post-op Pain Management:    Induction:   PONV Risk Score and Plan: 2 and Ondansetron, Dexamethasone and Propofol infusion  Airway Management Planned: Natural Airway and Simple Face Mask  Additional Equipment:   Intra-op Plan:   Post-operative Plan:   Informed Consent: I have reviewed the patients History and Physical, chart, labs and discussed the procedure including the risks, benefits and alternatives for the proposed anesthesia with the patient or authorized representative who has indicated his/her understanding and acceptance.   Dental advisory given  Plan Discussed with: CRNA and Anesthesiologist  Anesthesia Plan Comments:         Anesthesia Quick Evaluation

## 2017-07-23 NOTE — Anesthesia Postprocedure Evaluation (Signed)
Anesthesia Post Note  Patient: Wanda Harris  Procedure(s) Performed: PERCUTANEOUS PINNING OF RIGHT RING FINGER FRACTURE (Right Finger)     Patient location during evaluation: PACU Anesthesia Type: MAC Level of consciousness: awake and alert Pain management: pain level controlled Vital Signs Assessment: post-procedure vital signs reviewed and stable Respiratory status: spontaneous breathing and respiratory function stable Cardiovascular status: stable Postop Assessment: no apparent nausea or vomiting Anesthetic complications: no    Last Vitals:  Vitals:   07/23/17 1345 07/23/17 1400  BP: (!) 95/51 100/63  Pulse: 71 (!) 59  Resp: 12 (!) 21  Temp: 36.7 C   SpO2: 100% 97%    Last Pain:  Vitals:   07/23/17 1412  TempSrc:   PainSc: 0-No pain                 [name]

## 2017-07-23 NOTE — Transfer of Care (Signed)
Immediate Anesthesia Transfer of Care Note  Patient: Wanda Harris  Procedure(s) Performed: PERCUTANEOUS PINNING OF RIGHT RING FINGER FRACTURE (Right Finger)  Patient Location: PACU  Anesthesia Type:MAC  Level of Consciousness: awake and patient cooperative  Airway & Oxygen Therapy: Patient Spontanous Breathing and Patient connected to face mask oxygen  Post-op Assessment: Report given to RN and Post -op Vital signs reviewed and stable  Post vital signs: Reviewed and stable  Last Vitals:  Vitals:   07/23/17 1135  BP: 113/64  Pulse: 65  Resp: 18  Temp: 36.9 C  SpO2: 99%    Last Pain:  Vitals:   07/23/17 1135  TempSrc: Oral  PainSc: 3       Patients Stated Pain Goal: 3 (81/84/03 7543)  Complications: No apparent anesthesia complications

## 2017-07-23 NOTE — Interval H&P Note (Signed)
History and Physical Interval Note:  07/23/2017 1:11 PM  Wanda Harris  has presented today for surgery, with the diagnosis of RIGHT RING FINGER FRACTURE  The various methods of treatment have been discussed with the patient and family. After consideration of risks, benefits and other options for treatment, the patient has consented to  Procedure(s): PERCUTANEOUS PINNING OF RIGHT RING FINGER FRACTURE (Right) as a surgical intervention .  The patient's history has been reviewed, patient examined, no change in status, stable for surgery.  I have reviewed the patient's chart and labs.  Questions were answered to the patient's satisfaction.     Jolyn Nap

## 2017-07-24 ENCOUNTER — Encounter (HOSPITAL_BASED_OUTPATIENT_CLINIC_OR_DEPARTMENT_OTHER): Payer: Self-pay | Admitting: Orthopedic Surgery

## 2017-09-05 IMAGING — US US MFM OB LIMITED
1 series · 13 of 13 positions shown · non-contrast
Comparison: none

[Series 1: us mfm ob limited · 13 acquisitions, 13 frames shown]
[im 1/13]
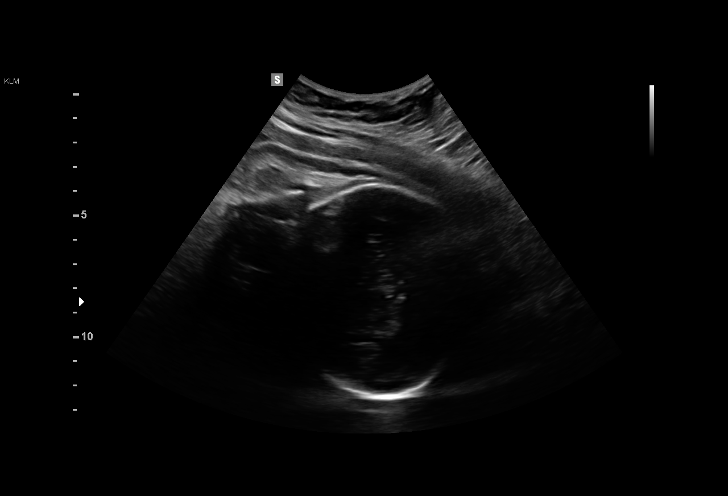
[im 2/13]
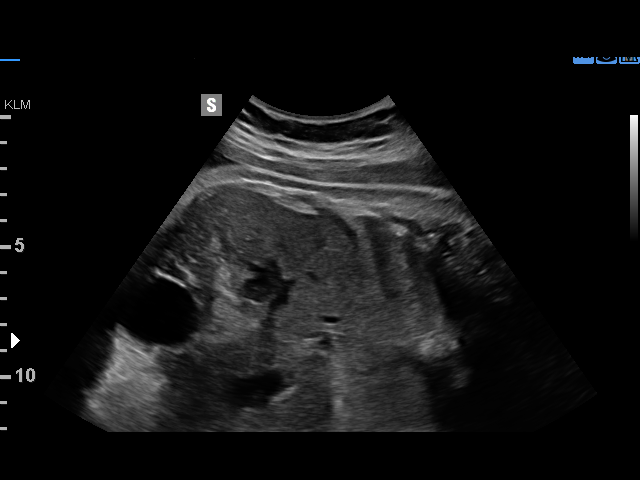
[im 3/13]
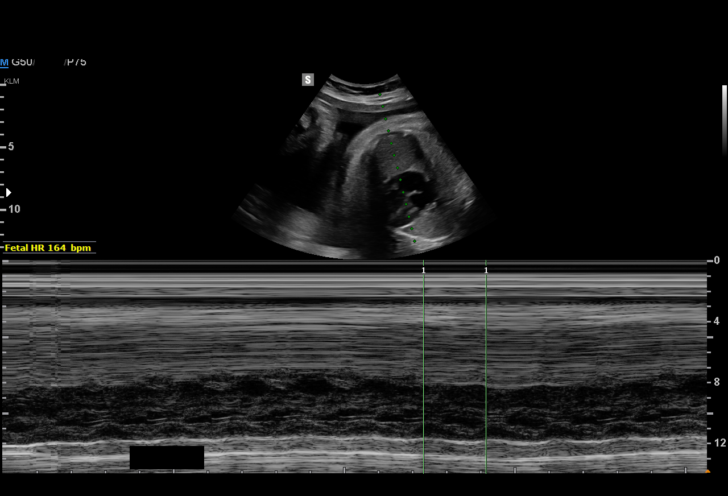
[im 4/13]
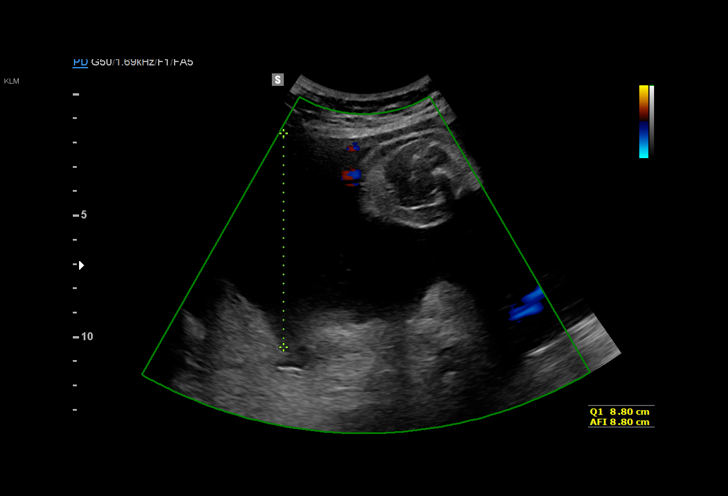
[im 5/13]
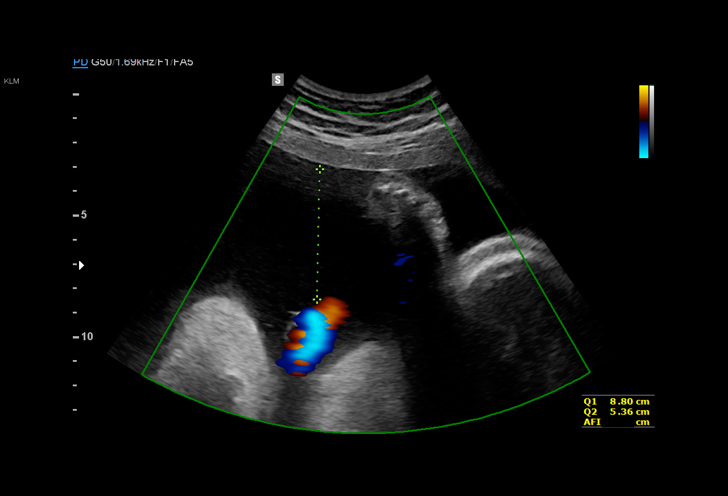
[im 6/13]
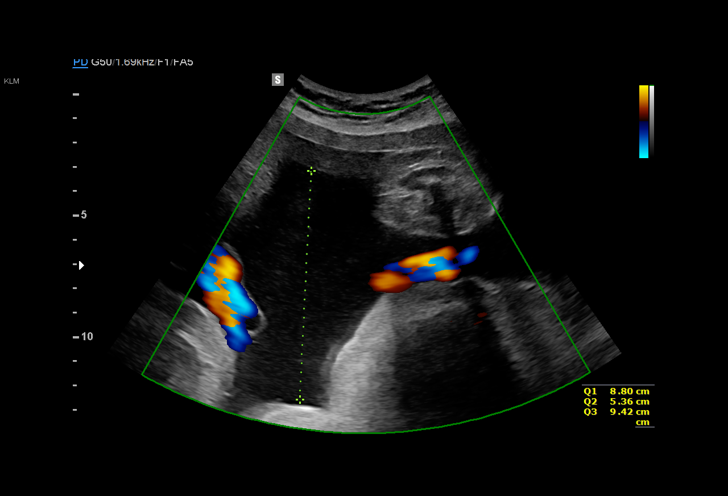
[im 7/13]
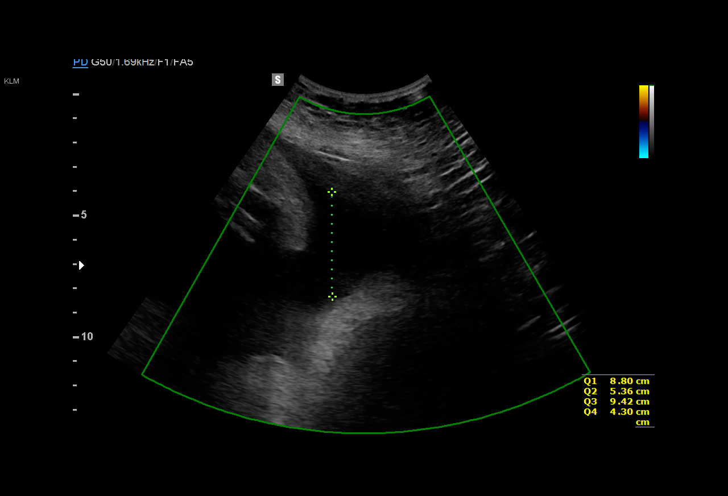
[im 8/13]
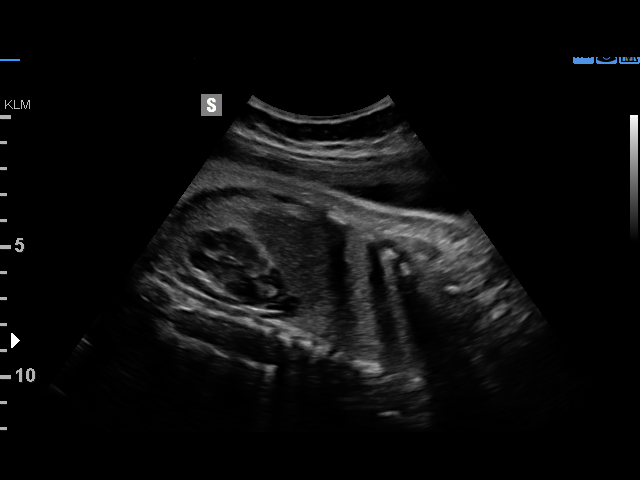
[im 9/13]
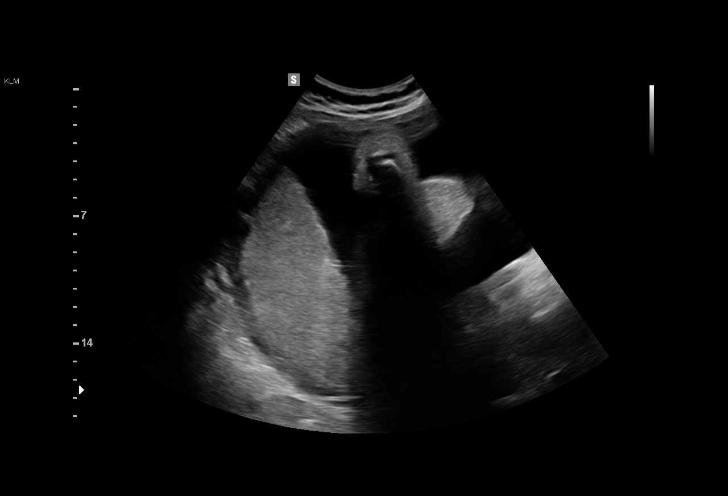
[im 10/13]
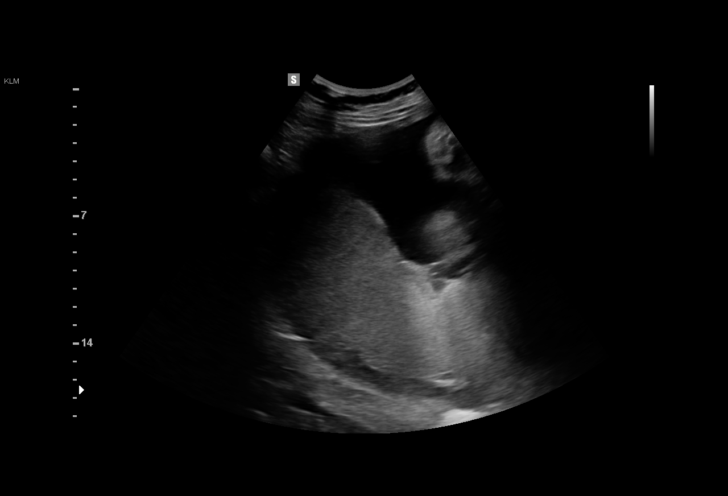
[im 11/13]
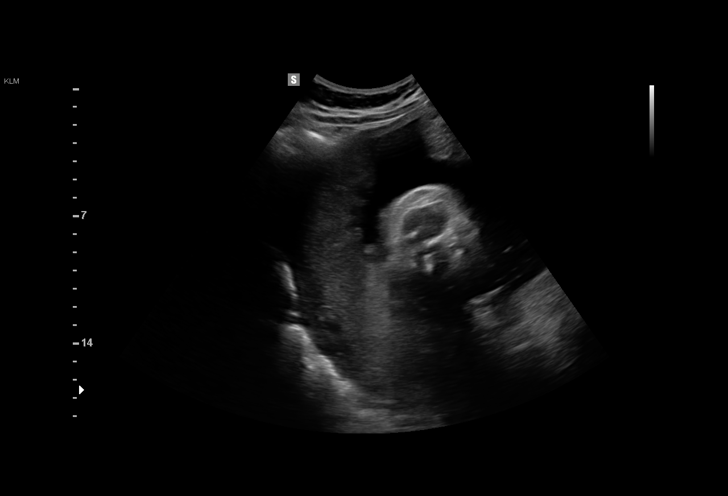
[im 12/13]
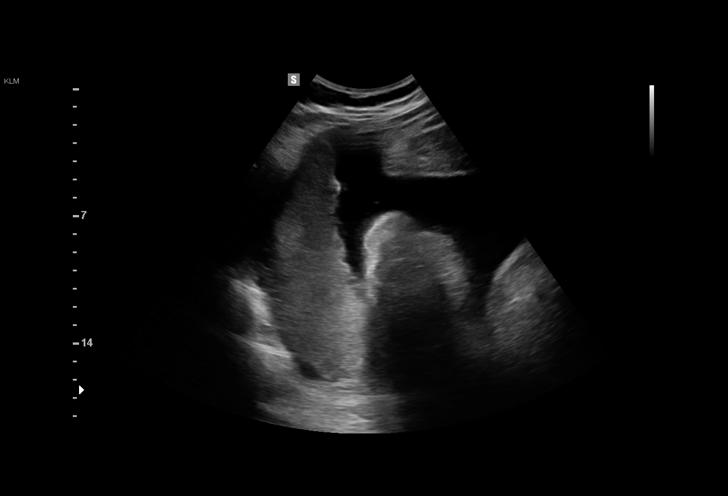
[im 13/13]
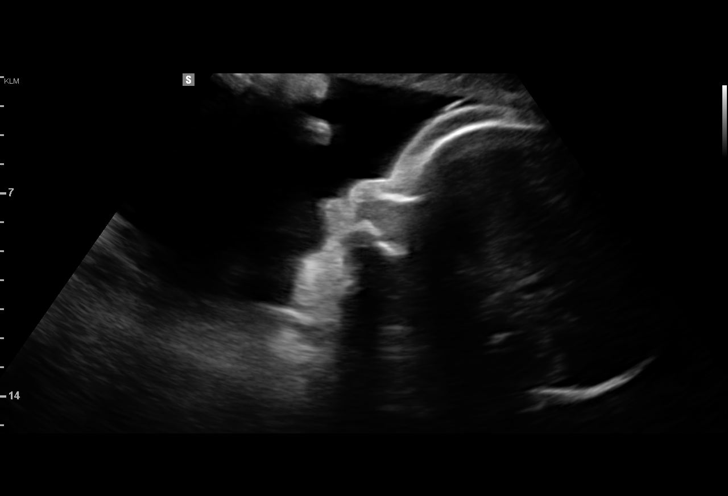

[13 of 13 positions shown; findings below may reference images not displayed]

Hospital Clinic-
Faculty Physician
OB/Gyn Clinic

1  BAGROVRZA PARADIEZ           [id number]      [id number]     [id number]
Indications

34 weeks gestation of pregnancy
Previous cesarean delivery, antepartum
Gestational diabetes in pregnancy,
controlled by oral hypoglycemic drugs
Poor obstetric history: Previous gestational
diabetes
Polyhydramnios, third trimester, antepartum
condition or complication, unspecified fetus
OB History

Gravidity:    6         Term:   4
TOP:          1        Living:  4
Fetal Evaluation

Num Of Fetuses:     1
Fetal Heart         164
Rate(bpm):
Cardiac Activity:   Observed
Presentation:       Cephalic
Placenta:           Posterior, above cervical os
P. Cord Insertion:  Previously Visualized
Amniotic Fluid
AFI FV:      Polyhydramnios

AFI Sum(cm)     %Tile       Largest Pocket(cm)
27.88           > 97

RUQ(cm)       RLQ(cm)       LUQ(cm)        LLQ(cm)
8.8
Gestational Age

LMP:           34w 4d       Date:   03/14/15                 EDD:   12/19/15
Best:          34w 4d    Det. By:   LMP  (03/14/15)          EDD:   12/19/15
Cervix Uterus Adnexa

Cervix
Not visualized (advanced GA >75wks)
Impression

Single IUP at 34w 4d
Limited ultrasound performed for amniotic fluid volume
assessment
Cephalic presentation
Reactive NST
Polyhydramnios noted (AFI 27.8 cm)
Recommendations

Continue 2x weekly NSTs with weekly AFIs
Serial ultrasounds for growth
Delivery at 39 weeks in the absence of other complications

## 2017-09-24 ENCOUNTER — Encounter: Payer: Self-pay | Admitting: *Deleted

## 2018-04-21 ENCOUNTER — Encounter: Payer: Self-pay | Admitting: *Deleted

## 2018-06-08 ENCOUNTER — Emergency Department (HOSPITAL_COMMUNITY): Payer: Self-pay

## 2018-06-08 ENCOUNTER — Emergency Department (HOSPITAL_COMMUNITY)
Admission: EM | Admit: 2018-06-08 | Discharge: 2018-06-08 | Disposition: A | Discharge status: Home / Self Care | Payer: Self-pay | Attending: Emergency Medicine | Admitting: Emergency Medicine

## 2018-06-08 ENCOUNTER — Encounter (HOSPITAL_COMMUNITY): Payer: Self-pay | Admitting: Pharmacy Technician

## 2018-06-08 ENCOUNTER — Other Ambulatory Visit: Payer: Self-pay

## 2018-06-08 DIAGNOSIS — Z87891 Personal history of nicotine dependence: Secondary | ICD-10-CM | POA: Insufficient documentation

## 2018-06-08 DIAGNOSIS — R112 Nausea with vomiting, unspecified: Secondary | ICD-10-CM

## 2018-06-08 DIAGNOSIS — R1030 Lower abdominal pain, unspecified: Secondary | ICD-10-CM

## 2018-06-08 DIAGNOSIS — R740 Nonspecific elevation of levels of transaminase and lactic acid dehydrogenase [LDH]: Secondary | ICD-10-CM | POA: Insufficient documentation

## 2018-06-08 DIAGNOSIS — N12 Tubulo-interstitial nephritis, not specified as acute or chronic: Secondary | ICD-10-CM | POA: Insufficient documentation

## 2018-06-08 DIAGNOSIS — R7401 Elevation of levels of liver transaminase levels: Secondary | ICD-10-CM

## 2018-06-08 DIAGNOSIS — J45909 Unspecified asthma, uncomplicated: Secondary | ICD-10-CM | POA: Insufficient documentation

## 2018-06-08 DIAGNOSIS — R197 Diarrhea, unspecified: Secondary | ICD-10-CM

## 2018-06-08 DIAGNOSIS — E119 Type 2 diabetes mellitus without complications: Secondary | ICD-10-CM | POA: Insufficient documentation

## 2018-06-08 DIAGNOSIS — D729 Disorder of white blood cells, unspecified: Secondary | ICD-10-CM | POA: Insufficient documentation

## 2018-06-08 LAB — COMPREHENSIVE METABOLIC PANEL
ALT: 74 U/L — ABNORMAL HIGH (ref 0–44)
AST: 374 U/L — ABNORMAL HIGH (ref 15–41)
Albumin: 3.5 g/dL (ref 3.5–5.0)
Alkaline Phosphatase: 114 U/L (ref 38–126)
Anion gap: 12 (ref 5–15)
BUN: 7 mg/dL (ref 6–20)
CO2: 22 mmol/L (ref 22–32)
Calcium: 8.4 mg/dL — ABNORMAL LOW (ref 8.9–10.3)
Chloride: 101 mmol/L (ref 98–111)
Creatinine, Ser: 0.74 mg/dL (ref 0.44–1.00)
GFR calc Af Amer: >60 mL/min (ref >60)
GFR calc non Af Amer: >60 mL/min (ref >60)
Glucose, Bld: 244 mg/dL — ABNORMAL HIGH (ref 70–99)
Potassium: 3.4 mmol/L — ABNORMAL LOW (ref 3.5–5.1)
Sodium: 135 mmol/L (ref 135–145)
Total Bilirubin: 0.6 mg/dL (ref 0.3–1.2)
Total Protein: 7.4 g/dL (ref 6.5–8.1)

## 2018-06-08 LAB — CBC WITH DIFFERENTIAL/PLATELET
Abs Immature Granulocytes: 0.03 10*3/uL (ref 0.00–0.07)
BASOS ABS: 0.1 10*3/uL (ref 0.0–0.1)
Basophils Relative: 0 %
EOS PCT: 0 %
Eosinophils Absolute: 0 10*3/uL (ref 0.0–0.5)
HCT: 35.9 % — ABNORMAL LOW (ref 36.0–46.0)
Hemoglobin: 10.7 g/dL — ABNORMAL LOW (ref 12.0–15.0)
Immature Granulocytes: 0 %
Lymphocytes Relative: 12 %
Lymphs Abs: 1.6 10*3/uL (ref 0.7–4.0)
MCH: 22.7 pg — ABNORMAL LOW (ref 26.0–34.0)
MCHC: 29.8 g/dL — AB (ref 30.0–36.0)
MCV: 76.1 fL — ABNORMAL LOW (ref 80.0–100.0)
Monocytes Absolute: 0.7 10*3/uL (ref 0.1–1.0)
Monocytes Relative: 5 %
NRBC: 0 % (ref 0.0–0.2)
Neutro Abs: 10.9 10*3/uL — ABNORMAL HIGH (ref 1.7–7.7)
Neutrophils Relative %: 83 %
Platelets: 299 10*3/uL (ref 150–400)
RBC: 4.72 MIL/uL (ref 3.87–5.11)
RDW: 14.6 % (ref 11.5–15.5)
WBC: 13.3 10*3/uL — ABNORMAL HIGH (ref 4.0–10.5)

## 2018-06-08 LAB — URINALYSIS, ROUTINE W REFLEX MICROSCOPIC
Bacteria, UA: NONE SEEN
Bilirubin Urine: NEGATIVE
Glucose, UA: >=500 mg/dL — AB
KETONES UR: 80 mg/dL — AB
Nitrite: POSITIVE — AB
Protein, ur: 30 mg/dL — AB
Specific Gravity, Urine: 1.022 (ref 1.005–1.030)
pH: 6 (ref 5.0–8.0)

## 2018-06-08 LAB — INFLUENZA PANEL BY PCR (TYPE A & B)
Influenza A By PCR: NEGATIVE
Influenza B By PCR: NEGATIVE

## 2018-06-08 LAB — LIPASE, BLOOD: Lipase: 22 U/L (ref 11–51)

## 2018-06-08 LAB — I-STAT BETA HCG BLOOD, ED (MC, WL, AP ONLY): I-stat hCG, quantitative: <5 m[IU]/mL (ref <5)

## 2018-06-08 MED ORDER — CEPHALEXIN 500 MG PO CAPS: 500.0000 mg | ORAL_CAPSULE | Freq: Four times a day (QID) | ORAL | 0 refills | Status: DC

## 2018-06-08 MED ORDER — ACETAMINOPHEN 500 MG PO TABS
1000.0000 mg | ORAL_TABLET | Freq: Once | ORAL | Status: AC
  Administered 2018-06-08: 1000 mg via ORAL
  Filled 2018-06-08: qty 2

## 2018-06-08 MED ORDER — ONDANSETRON HCL 4 MG/2ML IJ SOLN
4.0000 mg | Freq: Once | INTRAMUSCULAR | Status: AC
  Administered 2018-06-08: 4 mg via INTRAVENOUS
  Filled 2018-06-08: qty 2

## 2018-06-08 MED ORDER — ONDANSETRON 4 MG PO TBDP: 4.0000 mg | ORAL_TABLET | Freq: Three times a day (TID) | ORAL | 0 refills | Status: DC | PRN

## 2018-06-08 MED ORDER — MORPHINE SULFATE (PF) 4 MG/ML IV SOLN
4.0000 mg | Freq: Once | INTRAVENOUS | Status: AC
  Administered 2018-06-08: 4 mg via INTRAVENOUS
  Filled 2018-06-08: qty 1

## 2018-06-08 MED ORDER — SODIUM CHLORIDE 0.9 % IV BOLUS
1000.0000 mL | Freq: Once | INTRAVENOUS | Status: AC
  Administered 2018-06-08: 1000 mL via INTRAVENOUS

## 2018-06-08 MED ORDER — CEPHALEXIN 250 MG PO CAPS
1000.0000 mg | ORAL_CAPSULE | Freq: Once | ORAL | Status: AC
  Administered 2018-06-08: 1000 mg via ORAL
  Filled 2018-06-08: qty 4

## 2018-06-08 MED ORDER — IOHEXOL 300 MG/ML  SOLN
100.0000 mL | Freq: Once | INTRAMUSCULAR | Status: AC | PRN
  Administered 2018-06-08: 100 mL via INTRAVENOUS

## 2018-06-08 NOTE — ED Notes (Signed)
Pt c/o fever, shob, body aches, abdominal pain and diarrhea.

## 2018-06-08 NOTE — ED Provider Notes (Signed)
[facility] EMERGENCY DEPARTMENT Provider Note   CSN: [id number] Arrival date & time: 06/08/18  1836     History   Chief Complaint Chief Complaint  Patient presents with  . Fever  . Abdominal Pain    HPI Wanda Harris is a 37 y.o. female with a PMHx of asthma and DM2, who presents to the ED with complaints of 1 day of lower abdominal pain.  She describes the pain as 10/10 constant crampy lower abdominal pain that radiates to her back diffusely, worse with movement or activity, and unrelieved with Aleve.  She reports associated hematuria, increased urinary frequency and urgency, subjective fevers and chills, body aches, malodorous urine, 6-7 episodes of nonbloody diarrhea, and 2 episodes of nonbloody nonbilious emesis with associated nausea.  She denies cough, URI symptoms, CP, SOB, constipation, obstipation, melena, hematochezia, hematemesis, dysuria, vaginal bleeding/discharge, focal arthralgias, numbness, tingling, focal weakness, or any other complaints at this time. Denies recent travel, sick contacts, suspicious food intake, EtOH use, or frequent NSAID use.  The history is provided by the patient and medical records. No language interpreter was used.  Fever   Associated symptoms include vomiting. Pertinent negatives include no chest pain, no diarrhea, no congestion, no sore throat and no cough.  Abdominal Pain   Associated symptoms include fever (subjective), nausea, vomiting, frequency, hematuria and myalgias (body aches). Pertinent negatives include diarrhea, constipation, dysuria and arthralgias.    Past Medical History:  Diagnosis Date  . Asthma    no current med.  . Closed fracture of one rib of left side 07/20/2017   left 9th  . Finger fracture, right 07/20/2017   ring finger  . Migraines   . Non-insulin dependent type 2 diabetes mellitus [facility])     Patient Active Problem List   Diagnosis Date Noted  . Postpartum care following cesarean  delivery 01/12/2016  . Vaginal burning 01/12/2016  . Constipation 01/12/2016  . Gestational diabetes mellitus (GDM), postpartum 01/12/2016  . S/P repeat low transverse C-section 11/28/2015  . Fetal macrosomia, delivered, current hospitalization 11/28/2015  . Pyelonephritis affecting pregnancy in third trimester, antepartum 10/21/2015  . Trichomonal vaginitis in pregnancy 08/06/2015  . Supervision of young multigravida, antepartum 08/04/2015  . GBS (group B streptococcus) UTI complicating pregnancy 71/24/5809  . Gestational diabetes mellitus (GDM) affecting pregnancy 08/04/2015  . Uterine scar from previous surgery affecting pregnancy 08/04/2015    Past Surgical History:  Procedure Laterality Date  . CESAREAN SECTION N/A 07/01/2013   Procedure: Primary Cesarean Section Delivery Baby Boy @ 2038, Apgars 7/9;  Surgeon: Jonnie Kind, MD;  Location: [facility];  Service: Obstetrics;  Laterality: N/A;  . CESAREAN SECTION WITH BILATERAL TUBAL LIGATION Bilateral 11/28/2015   Procedure: CESAREAN SECTION WITH BILATERAL TUBAL LIGATION;  Surgeon: Woodroe Mode, MD;  Location: [facility];  Service: Obstetrics;  Laterality: Bilateral;  . OPEN REDUCTION INTERNAL FIXATION (ORIF) PROXIMAL PHALANX Right 07/23/2017   Procedure: PERCUTANEOUS PINNING OF RIGHT RING FINGER FRACTURE;  Surgeon: Milly Jakob, MD;  Location: [facility];  Service: Orthopedics;  Laterality: Right;     OB History    Gravida  6   Para  5   Term  5   Preterm      AB  1   Living  5     SAB  1   TAB  0   Ectopic      Multiple  0   Live Births  5  Home Medications    Prior to Admission medications   Medication Sig Start Date End Date Taking? Authorizing Provider  acetaminophen (TYLENOL) 325 MG tablet Take 2 tablets (650 mg total) by mouth every 6 (six) hours. 07/23/17   Milly Jakob, MD  metFORMIN (GLUCOPHAGE) 500 MG tablet Take 1 tablet (500 mg total) by mouth 2 (two) times  daily with a meal. 01/12/16   Mumaw, Lauralyn Primes, DO  naproxen (NAPROSYN) 500 MG tablet Take 1 tablet (500 mg total) by mouth 2 (two) times daily. 07/22/17   Petrucelli, Samantha R, PA-C  traMADol (ULTRAM) 50 MG tablet Take 1 tablet (50 mg total) by mouth every 6 (six) hours as needed for moderate pain. 07/23/17   Milly Jakob, MD    Family History Family History  Problem Relation Age of Onset  . Diabetes Mother   . Hypertension Mother   . Arthritis Mother   . Hyperlipidemia Father   . Heart disease Father   . Stroke Father     Social History Social History   Tobacco Use  . Smoking status: Former Smoker    Last attempt to quit: 07/26/2007    Years since quitting: 10.8  . Smokeless tobacco: Never Used  Substance Use Topics  . Alcohol use: Yes    Comment: occasionally  . Drug use: No     Allergies   Patient has no known allergies.   Review of Systems Review of Systems  Constitutional: Positive for chills and fever (subjective).  HENT: Negative for congestion, rhinorrhea and sore throat.   Respiratory: Negative for cough and shortness of breath.   Cardiovascular: Negative for chest pain.  Gastrointestinal: Positive for abdominal pain, nausea and vomiting. Negative for blood in stool, constipation and diarrhea.  Genitourinary: Positive for flank pain, frequency, hematuria and urgency. Negative for dysuria, vaginal bleeding and vaginal discharge.       +malodorous urine  Musculoskeletal: Positive for back pain and myalgias (body aches). Negative for arthralgias.  Skin: Negative for color change.  Allergic/Immunologic: Positive for immunocompromised state (DM2).  Neurological: Negative for weakness and numbness.  Psychiatric/Behavioral: Negative for confusion.   All other systems reviewed and are negative for acute change except as noted in the HPI.    Physical Exam Updated Vital Signs BP 114/74 (BP Location: Right Arm)   Pulse (!) 105   Temp 99.5 F (37.5 C)  (Oral)   Resp 18   Ht 4\' 11"  (1.499 m)   Wt 70.3 kg   SpO2 100%   BMI 31.31 kg/m   Physical Exam Vitals signs and nursing note reviewed.  Constitutional:      General: She is not in acute distress.    Appearance: Normal appearance. She is well-developed. She is not toxic-appearing.     Comments: Low grade temp 99.5 however feels warm, nontoxic, NAD, not septic appearing  HENT:     Head: Normocephalic and atraumatic.  Eyes:     General:        Right eye: No discharge.        Left eye: No discharge.     Conjunctiva/sclera: Conjunctivae normal.  Neck:     Musculoskeletal: Normal range of motion and neck supple.  Cardiovascular:     Rate and Rhythm: Regular rhythm. Tachycardia present.     Heart sounds: Normal heart sounds, S1 normal and S2 normal. No murmur. No friction rub. No gallop.      Comments: Mildly tachycardic in the low 100s Pulmonary:     Effort:  Pulmonary effort is normal. No respiratory distress.     Breath sounds: Normal breath sounds. No decreased breath sounds, wheezing, rhonchi or rales.  Abdominal:     General: Bowel sounds are normal. There is no distension.     Palpations: Abdomen is soft. Abdomen is not rigid.     Tenderness: There is abdominal tenderness in the right lower quadrant and suprapubic area. There is right CVA tenderness and left CVA tenderness. There is no guarding or rebound. Positive signs include McBurney's sign. Negative signs include Murphy's sign.     Comments: Soft, nondistended, +BS throughout, with moderate suprapubic and RLQ TTP, no r/g/r, neg murphy's, +mcburney's point TTP, +B/L CVA TTP   Musculoskeletal: Normal range of motion.  Skin:    General: Skin is warm and dry.     Findings: No rash.  Neurological:     Mental Status: She is alert and oriented to person, place, and time.     Sensory: Sensation is intact. No sensory deficit.     Motor: Motor function is intact.  Psychiatric:        Mood and Affect: Mood and affect normal.         Behavior: Behavior normal.      ED Treatments / Results  Labs (all labs ordered are listed, but only abnormal results are displayed) Labs Reviewed  CBC WITH DIFFERENTIAL/PLATELET - Abnormal; Notable for the following components:      Result Value   WBC 13.3 (*)    Hemoglobin 10.7 (*)    HCT 35.9 (*)    MCV 76.1 (*)    MCH 22.7 (*)    MCHC 29.8 (*)    Neutro Abs 10.9 (*)    All other components within normal limits  COMPREHENSIVE METABOLIC PANEL - Abnormal; Notable for the following components:   Potassium 3.4 (*)    Glucose, Bld 244 (*)    Calcium 8.4 (*)    AST 374 (*)    ALT 74 (*)    All other components within normal limits  URINALYSIS, ROUTINE W REFLEX MICROSCOPIC - Abnormal; Notable for the following components:   APPearance CLOUDY (*)    Glucose, UA >=500 (*)    Hgb urine dipstick LARGE (*)    Ketones, ur 80 (*)    Protein, ur 30 (*)    Nitrite POSITIVE (*)    Leukocytes, UA LARGE (*)    WBC, UA >50 (*)    All other components within normal limits  LIPASE, BLOOD  INFLUENZA PANEL BY PCR (TYPE A & B)  I-STAT BETA HCG BLOOD, ED ([facility], [facility], [facility] ONLY)    EKG None  Radiology No results found.  Procedures Procedures (including critical care time)  Medications Ordered in ED Medications  ondansetron (ZOFRAN) injection 4 mg (4 mg Intravenous Given 06/08/18 1948)  morphine 4 MG/ML injection 4 mg (4 mg Intravenous Given 06/08/18 1948)  acetaminophen (TYLENOL) tablet 1,000 mg (1,000 mg Oral Given 06/08/18 1948)  sodium chloride 0.9 % bolus 1,000 mL (1,000 mLs Intravenous New Bag/Given 06/08/18 1948)  iohexol (OMNIPAQUE) 300 MG/ML solution 100 mL (100 mLs Intravenous Contrast Given 06/08/18 2148)     Initial Impression / Assessment and Plan / ED Course  I have reviewed the triage vital signs and the nursing notes.  Pertinent labs & imaging results that were available during my care of the patient were reviewed by me and considered in my medical decision making  (see chart for details).     Robinson  y.o. female here with lower abdominal pain, nausea, vomiting, diarrhea, urinary frequency urgency, hematuria, malodorous urine, subjective fevers and chills, and body aches x1 day.  On exam, low grade temp 99.5 but feels like she likely has a fever; HR mildly elevated 105 but overall not septic appearing; diffuse lower abd TTP mostly in the suprapubic and RLQ regions, +mcburney's point tenderness, but no rebound/guarding/rigidity. B/l flank tenderness. DDx includes appendicitis, pyelonephritis/UTI, diverticulitis, flu, etc. Will get labs and CT abd/pelv for now, will also get flu swab. Will give tylenol, fluids, zofran, and morphine. Will hold off on pelvic exam for now while we look at other etiologies of her symptoms. Will reassess shortly.   9:37 PM CBC w/diff with mild leukocytosis WBC 77.1, neutrophilic predominant; also with stable mild anemia. CMP with AST 374, ALT 74, gluc 244; rest of LFTs unremarkable, no RUQ tenderness and neg murphy's, doubt we need to change the imaging at this time. Lipase WNL. U/A with large hgb, +nitrites, +leuks, >50 WBCs with clumps seen, c/w UTI/pyelo. BetaHCG negative. Flu test pending. CT abd/pelv not yet done. Pt feeling overall better. Will continue to monitor and reassess after CT is back; if no appendicitis or other findings on CT, will treat for UTI/pyelo.   9:58 PM Flu test negative. CT abd/pelv pending. Patient care to be resumed by Dr. Tyrone Nine at shift change sign-out. Patient history has been discussed with provider resuming care. Please see their notes for further documentation of pending results and dispo/care. Pt stable at sign-out and updated on transfer of care.    Final Clinical Impressions(s) / ED Diagnoses   Final diagnoses:  Lower abdominal pain  Neutrophilic leukocytosis  Transaminitis  Nausea vomiting and diarrhea    ED Discharge Orders    [address] [place], [place] 06/08/18 2158    Deno Etienne, DO 06/08/18 2247

## 2018-06-08 NOTE — ED Notes (Signed)
Patient transported to CT 

## 2018-06-08 NOTE — ED Triage Notes (Signed)
Pt presents with c/o fever, lower abd pain, hematuria onset yesterday. Reports pain radiates into her back.

## 2018-06-08 NOTE — ED Notes (Signed)
Patient verbalizes understanding of discharge instructions. Opportunity for questioning and answers were provided. 

## 2018-06-08 NOTE — Discharge Instructions (Signed)
Take tylenol 2 pills 4 times a day and motrin 4 pills 3 times a day.  Drink plenty of fluids.  Return for worsening pain, or inability to eat or drink. Follow up with your family doctor.

## 2018-10-01 ENCOUNTER — Encounter: Payer: Self-pay | Admitting: *Deleted

## 2018-12-10 ENCOUNTER — Encounter: Payer: Self-pay | Admitting: *Deleted

## 2020-08-12 ENCOUNTER — Encounter (HOSPITAL_COMMUNITY): Payer: Self-pay

## 2020-08-12 ENCOUNTER — Other Ambulatory Visit: Payer: Self-pay

## 2020-08-12 ENCOUNTER — Emergency Department (HOSPITAL_COMMUNITY)
Admission: EM | Admit: 2020-08-12 | Discharge: 2020-08-13 | Disposition: A | Discharge status: Home / Self Care | Payer: Self-pay | Attending: Emergency Medicine | Admitting: Emergency Medicine

## 2020-08-12 DIAGNOSIS — M549 Dorsalgia, unspecified: Secondary | ICD-10-CM | POA: Insufficient documentation

## 2020-08-12 DIAGNOSIS — R300 Dysuria: Secondary | ICD-10-CM | POA: Insufficient documentation

## 2020-08-12 DIAGNOSIS — R109 Unspecified abdominal pain: Secondary | ICD-10-CM | POA: Insufficient documentation

## 2020-08-12 DIAGNOSIS — Z5321 Procedure and treatment not carried out due to patient leaving prior to being seen by health care provider: Secondary | ICD-10-CM | POA: Insufficient documentation

## 2020-08-12 DIAGNOSIS — K5900 Constipation, unspecified: Secondary | ICD-10-CM | POA: Insufficient documentation

## 2020-08-12 LAB — URINALYSIS, ROUTINE W REFLEX MICROSCOPIC
Bilirubin Urine: NEGATIVE
Glucose, UA: >=500 mg/dL — AB
Hgb urine dipstick: NEGATIVE
Ketones, ur: 80 mg/dL — AB
Nitrite: POSITIVE — AB
Protein, ur: NEGATIVE mg/dL
Specific Gravity, Urine: 1.038 — ABNORMAL HIGH (ref 1.005–1.030)
pH: 5 (ref 5.0–8.0)

## 2020-08-12 LAB — COMPREHENSIVE METABOLIC PANEL
ALT: 18 U/L (ref 0–44)
AST: 17 U/L (ref 15–41)
Albumin: 3.7 g/dL (ref 3.5–5.0)
Alkaline Phosphatase: 99 U/L (ref 38–126)
Anion gap: 10 (ref 5–15)
BUN: 11 mg/dL (ref 6–20)
CO2: 21 mmol/L — ABNORMAL LOW (ref 22–32)
Calcium: 8.8 mg/dL — ABNORMAL LOW (ref 8.9–10.3)
Chloride: 102 mmol/L (ref 98–111)
Creatinine, Ser: 0.57 mg/dL (ref 0.44–1.00)
GFR, Estimated: >60 mL/min (ref >60)
Glucose, Bld: 292 mg/dL — ABNORMAL HIGH (ref 70–99)
Potassium: 3.9 mmol/L (ref 3.5–5.1)
Sodium: 133 mmol/L — ABNORMAL LOW (ref 135–145)
Total Bilirubin: 0.6 mg/dL (ref 0.3–1.2)
Total Protein: 7.4 g/dL (ref 6.5–8.1)

## 2020-08-12 LAB — CBC
HCT: 37.6 % (ref 36.0–46.0)
Hemoglobin: 12 g/dL (ref 12.0–15.0)
MCH: 26 pg (ref 26.0–34.0)
MCHC: 31.9 g/dL (ref 30.0–36.0)
MCV: 81.6 fL (ref 80.0–100.0)
Platelets: 300 10*3/uL (ref 150–400)
RBC: 4.61 MIL/uL (ref 3.87–5.11)
RDW: 12.8 % (ref 11.5–15.5)
WBC: 13.6 10*3/uL — ABNORMAL HIGH (ref 4.0–10.5)
nRBC: 0 % (ref 0.0–0.2)

## 2020-08-12 LAB — I-STAT BETA HCG BLOOD, ED (MC, WL, AP ONLY): I-stat hCG, quantitative: <5 m[IU]/mL (ref <5)

## 2020-08-12 LAB — LIPASE, BLOOD: Lipase: 23 U/L (ref 11–51)

## 2020-08-12 NOTE — ED Triage Notes (Signed)
Patient reports abdominal pain started about 1 week ago, now going into back, reports pain with urination and constipation, denies NV/D.

## 2020-08-13 ENCOUNTER — Encounter: Payer: Self-pay | Admitting: Emergency Medicine

## 2020-08-13 ENCOUNTER — Ambulatory Visit
Admission: EM | Admit: 2020-08-13 | Discharge: 2020-08-13 | Disposition: A | Discharge status: Home / Self Care | Payer: Self-pay | Attending: Emergency Medicine | Admitting: Emergency Medicine

## 2020-08-13 ENCOUNTER — Other Ambulatory Visit: Payer: Self-pay

## 2020-08-13 DIAGNOSIS — N898 Other specified noninflammatory disorders of vagina: Secondary | ICD-10-CM | POA: Insufficient documentation

## 2020-08-13 DIAGNOSIS — R1084 Generalized abdominal pain: Secondary | ICD-10-CM | POA: Insufficient documentation

## 2020-08-13 DIAGNOSIS — R3989 Other symptoms and signs involving the genitourinary system: Secondary | ICD-10-CM | POA: Insufficient documentation

## 2020-08-13 DIAGNOSIS — M545 Low back pain, unspecified: Secondary | ICD-10-CM | POA: Insufficient documentation

## 2020-08-13 DIAGNOSIS — R300 Dysuria: Secondary | ICD-10-CM | POA: Insufficient documentation

## 2020-08-13 LAB — POCT URINALYSIS DIP (MANUAL ENTRY)
Bilirubin, UA: NEGATIVE
Glucose, UA: 500 mg/dL — AB
Nitrite, UA: POSITIVE — AB
Spec Grav, UA: >=1.03 — AB (ref 1.010–1.025)
Urobilinogen, UA: 0.2 E.U./dL
pH, UA: 6 (ref 5.0–8.0)

## 2020-08-13 MED ORDER — NAPROXEN 500 MG PO TABS: 500.0000 mg | ORAL_TABLET | Freq: Two times a day (BID) | ORAL | 0 refills | Status: DC

## 2020-08-13 MED ORDER — CEPHALEXIN 500 MG PO CAPS: 500.0000 mg | ORAL_CAPSULE | Freq: Two times a day (BID) | ORAL | 0 refills | Status: AC

## 2020-08-13 MED ORDER — FLUCONAZOLE 150 MG PO TABS: 150.0000 mg | ORAL_TABLET | Freq: Once | ORAL | 0 refills | Status: AC

## 2020-08-13 MED ORDER — LIDOCAINE 3 % EX CREA: 1.0000 g | TOPICAL_CREAM | Freq: Three times a day (TID) | CUTANEOUS | 0 refills | Status: DC | PRN

## 2020-08-13 MED ORDER — VALACYCLOVIR HCL 1 G PO TABS: 1000.0000 mg | ORAL_TABLET | Freq: Three times a day (TID) | ORAL | 0 refills | Status: AC

## 2020-08-13 NOTE — ED Triage Notes (Addendum)
Pt here for lower abd pain with radiation to lower back; pt sts dysuria x 2 days; pt sts "blisters on genitals" and "bumps around rectum"; pt was taking metformin but hasnt taken in over a year

## 2020-08-13 NOTE — ED Notes (Signed)
Pt leaving AMA, advised to return if symptoms worsen. 

## 2020-08-13 NOTE — Discharge Instructions (Addendum)
Urine culture and  Swabs pending results Begin Valtrex 3 times daily for the next 10 days to treat sores Keflex twice daily for 5 days to treat UTI Diflucan 1 tab today, repeat in 3 to 4 days to treat yeast infection Naprosyn twice daily for pain May apply lidocaine topically to sores to help with discomfort May try sitz bath's to further relieve discomfort Please continue to monitor pain, follow-up if any symptoms not improving or worsening

## 2020-08-13 NOTE — ED Provider Notes (Signed)
[facility]    CSN: [id number] Arrival date & time: 08/13/20  1761      History   Chief Complaint Chief Complaint  Patient presents with  . Dysuria    HPI Wanda Harris is a 40 y.o. female DM type II presenting today for evaluation of possible UTI.  Patient reports that she has had lower abdominal pain and back pain with associated dysuria.  Reports noticed some blisters to genital area and Bactroban with associated pain and burning.  She also reports a white thicker discharge with vaginal itching.  A lot of right-sided lower back pain radiating into right lower abdomen.  Denies any nausea or vomiting.  Denies fevers.  Patient went to emergency room yesterday and had urinalysis showing positive nitrites, trace leuks and many bacteria, lipase normal, slightly elevated white count at 13.6, CMP with hyperglycemia, negative pregnancy.  HPI  Past Medical History:  Diagnosis Date  . Asthma    no current med.  . Closed fracture of one rib of left side 07/20/2017   left 9th  . Finger fracture, right 07/20/2017   ring finger  . Migraines   . Non-insulin dependent type 2 diabetes mellitus [facility])     Patient Active Problem List   Diagnosis Date Noted  . Postpartum care following cesarean delivery 01/12/2016  . Vaginal burning 01/12/2016  . Constipation 01/12/2016  . Gestational diabetes mellitus (GDM), postpartum 01/12/2016  . S/P repeat low transverse C-section 11/28/2015  . Fetal macrosomia, delivered, current hospitalization 11/28/2015  . Pyelonephritis affecting pregnancy in third trimester, antepartum 10/21/2015  . Trichomonal vaginitis in pregnancy 08/06/2015  . Supervision of young multigravida, antepartum 08/04/2015  . GBS (group B streptococcus) UTI complicating pregnancy 60/73/7106  . Gestational diabetes mellitus (GDM) affecting pregnancy 08/04/2015  . Uterine scar from previous surgery affecting pregnancy 08/04/2015    Past Surgical History:   Procedure Laterality Date  . CESAREAN SECTION N/A 07/01/2013   Procedure: Primary Cesarean Section Delivery Baby Boy @ 2038, Apgars 7/9;  Surgeon: Jonnie Kind, MD;  Location: [facility];  Service: Obstetrics;  Laterality: N/A;  . CESAREAN SECTION WITH BILATERAL TUBAL LIGATION Bilateral 11/28/2015   Procedure: CESAREAN SECTION WITH BILATERAL TUBAL LIGATION;  Surgeon: Woodroe Mode, MD;  Location: [place];  Service: Obstetrics;  Laterality: Bilateral;  . OPEN REDUCTION INTERNAL FIXATION (ORIF) PROXIMAL PHALANX Right 07/23/2017   Procedure: PERCUTANEOUS PINNING OF RIGHT RING FINGER FRACTURE;  Surgeon: Milly Jakob, MD;  Location: [place];  Service: Orthopedics;  Laterality: Right;    OB History    Gravida  6   Para  5   Term  5   Preterm      AB  1   Living  5     SAB  1   IAB  0   Ectopic      Multiple  0   Live Births  5            Home Medications    Prior to Admission medications   Medication Sig Start Date End Date Taking? Authorizing Provider  cephALEXin (KEFLEX) 500 MG capsule Take 1 capsule (500 mg total) by mouth 2 (two) times daily for 5 days. 08/13/20 08/18/20 Yes Lidia Clavijo C, PA-C  fluconazole (DIFLUCAN) 150 MG tablet Take 1 tablet (150 mg total) by mouth once for 1 dose. Repeat in 3-4 days 08/13/20 08/13/20 Yes Taisa Deloria C, PA-C  Lidocaine 3 % CREA Apply 1 g topically every 8 (  eight) hours as needed. 08/13/20  Yes Fidelia Cathers C, PA-C  naproxen (NAPROSYN) 500 MG tablet Take 1 tablet (500 mg total) by mouth 2 (two) times daily. 08/13/20  Yes Gaius Ishaq C, PA-C  valACYclovir (VALTREX) 1000 MG tablet Take 1 tablet (1,000 mg total) by mouth 3 (three) times daily for 10 days. 08/13/20 08/23/20 Yes Eriel Dunckel C, PA-C  metFORMIN (GLUCOPHAGE) 500 MG tablet Take 1 tablet (500 mg total) by mouth 2 (two) times daily with a meal. Patient not taking: Reported on 06/08/2018 01/12/16 08/13/20  [place], Lauralyn Primes, DO     Family History Family History  Problem Relation Age of Onset  . Diabetes Mother   . Hypertension Mother   . Arthritis Mother   . Hyperlipidemia Father   . Heart disease Father   . Stroke Father     Social History Social History   Tobacco Use  . Smoking status: Former Smoker    Quit date: 07/26/2007    Years since quitting: 13.0  . Smokeless tobacco: Never Used  Vaping Use  . Vaping Use: Never used  Substance Use Topics  . Alcohol use: Yes    Comment: occasionally  . Drug use: No     Allergies   Patient has no known allergies.   Review of Systems Review of Systems  Constitutional: Negative for fever.  Respiratory: Negative for shortness of breath.   Cardiovascular: Negative for chest pain.  Gastrointestinal: Positive for abdominal pain. Negative for diarrhea, nausea and vomiting.  Genitourinary: Positive for dysuria and genital sores. Negative for flank pain, hematuria, menstrual problem, vaginal bleeding, vaginal discharge and vaginal pain.  Musculoskeletal: Positive for back pain.  Skin: Negative for rash.  Neurological: Negative for dizziness, light-headedness and headaches.     Physical Exam Triage Vital Signs ED Triage Vitals  Enc Vitals Group     BP 08/13/20 0946 118/79     Pulse Rate 08/13/20 0946 89     Resp 08/13/20 0946 18     Temp 08/13/20 0946 98.1 F (36.7 C)     Temp Source 08/13/20 0946 Oral     SpO2 08/13/20 0946 98 %     Weight --      Height --      Head Circumference --      Peak Flow --      Pain Score 08/13/20 0947 10     Pain Loc --      Pain Edu? --      Excl. in [place]? --    No data found.  Updated Vital Signs BP 118/79 (BP Location: Left Arm)   Pulse 89   Temp 98.1 F (36.7 C) (Oral)   Resp 18   LMP 08/09/2020   SpO2 98%   Visual Acuity Right Eye Distance:   Left Eye Distance:   Bilateral Distance:    Right Eye Near:   Left Eye Near:    Bilateral Near:     Physical Exam Vitals and nursing note reviewed.   Constitutional:      Appearance: She is well-developed and well-nourished.     Comments: No acute distress  HENT:     Head: Normocephalic and atraumatic.     Nose: Nose normal.  Eyes:     Conjunctiva/sclera: Conjunctivae normal.  Cardiovascular:     Rate and Rhythm: Normal rate and regular rhythm.  Pulmonary:     Effort: Pulmonary effort is normal. No respiratory distress.  Abdominal:     General: There is  no distension.     Comments: Tenderness to palpation in right lower quadrant extending to flank, generalized tenderness throughout entire abdomen as well, negative rebound, negative Rovsing, negative McBurney's, appears uncomfortable changing position  Genitourinary:    Comments: Vulvar area with ulcerative sores noted to area between the labia minora and majora, sores noted extending near perineum and rectum with associated external hemorrhoids, vulva overall appears erythematous, significant amount of white watery and thick discharge noted in vagina, Musculoskeletal:        General: Normal range of motion.     Cervical back: Neck supple.     Comments: Tenderness to palpation to right lower lumbar area  Skin:    General: Skin is warm and dry.  Neurological:     Mental Status: She is alert and oriented to person, place, and time.  Psychiatric:        Mood and Affect: Mood and affect normal.      UC Treatments / Results  Labs (all labs ordered are listed, but only abnormal results are displayed) Labs Reviewed  POCT URINALYSIS DIP (MANUAL ENTRY) - Abnormal; Notable for the following components:      Result Value   Clarity, UA cloudy (*)    Glucose, UA =500 (*)    Ketones, POC UA moderate (40) (*)    Spec Grav, UA >=1.030 (*)    Blood, UA small (*)    Protein Ur, POC trace (*)    Nitrite, UA Positive (*)    Leukocytes, UA Trace (*)    All other components within normal limits  URINE CULTURE  HSV CULTURE AND TYPING  CERVICOVAGINAL ANCILLARY ONLY     EKG   Radiology No results found.  Procedures Procedures (including critical care time)  Medications Ordered in UC Medications - No data to display  Initial Impression / Assessment and Plan / UC Course  I have reviewed the triage vital signs and the nursing notes.  Pertinent labs & imaging results that were available during my care of the patient were reviewed by me and considered in my medical decision making (see chart for details).     Dysuria likely secondary to possible underlying vaginitis, suspicious HSV sores versus UTI.  Urine culture, vaginal swab and HSV culture pending.  Opting to go ahead and empirically initiate on medicines for UTI, HSV and yeast.  Lidocaine topically for discomfort, Naprosyn for pain noted in right lower back and abdomen, discussed continue to monitor pain and abdomen and to follow-up in emergency room developing worsening pain for further imaging.  Discussed strict return precautions. Patient verbalized understanding and is agreeable with plan.  Final Clinical Impressions(s) / UC Diagnoses   Final diagnoses:  Dysuria  Genital sore  Vaginal discharge  Generalized abdominal pain  Acute right-sided low back pain without sciatica     Discharge Instructions     Urine culture and  Swabs pending results Begin Valtrex 3 times daily for the next 10 days to treat sores Keflex twice daily for 5 days to treat UTI Diflucan 1 tab today, repeat in 3 to 4 days to treat yeast infection Naprosyn twice daily for pain May apply lidocaine topically to sores to help with discomfort May try sitz bath's to further relieve discomfort Please continue to monitor pain, follow-up if any symptoms not improving or worsening    ED Prescriptions    Medication Sig Dispense Auth. Provider   naproxen (NAPROSYN) 500 MG tablet Take 1 tablet (500 mg total) by mouth 2 (  two) times daily. 30 tablet Tedrick Port C, PA-C   valACYclovir (VALTREX) 1000 MG tablet Take 1  tablet (1,000 mg total) by mouth 3 (three) times daily for 10 days. 30 tablet Berley Gambrell C, PA-C   Lidocaine 3 % CREA Apply 1 g topically every 8 (eight) hours as needed. 28.35 g Keigo Whalley C, PA-C   fluconazole (DIFLUCAN) 150 MG tablet Take 1 tablet (150 mg total) by mouth once for 1 dose. Repeat in 3-4 days 2 tablet Pammy Vesey C, PA-C   cephALEXin (KEFLEX) 500 MG capsule Take 1 capsule (500 mg total) by mouth 2 (two) times daily for 5 days. 10 capsule Holli Rengel, [place] C, PA-C     PDMP not reviewed this encounter.   Bakary Bramer, [place] C, PA-C 08/13/20 1045

## 2020-08-14 LAB — URINE CULTURE: Culture: >=100000 — AB

## 2020-08-15 LAB — CERVICOVAGINAL ANCILLARY ONLY
Bacterial Vaginitis (gardnerella): POSITIVE — AB
Candida Glabrata: NEGATIVE
Candida Vaginitis: POSITIVE — AB
Chlamydia: NEGATIVE
Comment: NEGATIVE
Comment: NEGATIVE
Comment: NEGATIVE
Comment: NEGATIVE
Comment: NEGATIVE
Comment: NORMAL
Neisseria Gonorrhea: NEGATIVE
Trichomonas: NEGATIVE

## 2020-08-15 LAB — URINE CULTURE

## 2020-08-16 ENCOUNTER — Telehealth (HOSPITAL_COMMUNITY): Payer: Self-pay | Admitting: Emergency Medicine

## 2020-08-16 LAB — HSV CULTURE AND TYPING

## 2020-08-16 MED ORDER — METRONIDAZOLE 500 MG PO TABS: 500.0000 mg | ORAL_TABLET | Freq: Two times a day (BID) | ORAL | 0 refills | Status: DC

## 2020-11-01 ENCOUNTER — Encounter: Payer: Self-pay | Admitting: *Deleted

## 2021-02-03 ENCOUNTER — Encounter (HOSPITAL_COMMUNITY): Payer: Self-pay | Admitting: Emergency Medicine

## 2021-02-03 ENCOUNTER — Emergency Department (HOSPITAL_COMMUNITY)
Admission: EM | Admit: 2021-02-03 | Discharge: 2021-02-03 | Disposition: A | Discharge status: Home / Self Care | Payer: Self-pay | Attending: Emergency Medicine | Admitting: Emergency Medicine

## 2021-02-03 ENCOUNTER — Ambulatory Visit (HOSPITAL_COMMUNITY)
Admission: EM | Admit: 2021-02-03 | Discharge: 2021-02-03 | Disposition: A | Discharge status: Nursing Home (Includes ICF) | Payer: Self-pay | Attending: Physician Assistant | Admitting: Physician Assistant

## 2021-02-03 ENCOUNTER — Emergency Department (HOSPITAL_COMMUNITY): Payer: Self-pay

## 2021-02-03 ENCOUNTER — Other Ambulatory Visit: Payer: Self-pay

## 2021-02-03 DIAGNOSIS — R112 Nausea with vomiting, unspecified: Secondary | ICD-10-CM | POA: Insufficient documentation

## 2021-02-03 DIAGNOSIS — E119 Type 2 diabetes mellitus without complications: Secondary | ICD-10-CM | POA: Insufficient documentation

## 2021-02-03 DIAGNOSIS — R1013 Epigastric pain: Secondary | ICD-10-CM | POA: Insufficient documentation

## 2021-02-03 DIAGNOSIS — Z87891 Personal history of nicotine dependence: Secondary | ICD-10-CM | POA: Insufficient documentation

## 2021-02-03 DIAGNOSIS — I959 Hypotension, unspecified: Secondary | ICD-10-CM | POA: Insufficient documentation

## 2021-02-03 DIAGNOSIS — K219 Gastro-esophageal reflux disease without esophagitis: Secondary | ICD-10-CM | POA: Insufficient documentation

## 2021-02-03 DIAGNOSIS — Z7984 Long term (current) use of oral hypoglycemic drugs: Secondary | ICD-10-CM | POA: Insufficient documentation

## 2021-02-03 DIAGNOSIS — J45909 Unspecified asthma, uncomplicated: Secondary | ICD-10-CM | POA: Insufficient documentation

## 2021-02-03 LAB — COMPREHENSIVE METABOLIC PANEL
ALT: 20 U/L (ref 0–44)
AST: 14 U/L — ABNORMAL LOW (ref 15–41)
Albumin: 3.3 g/dL — ABNORMAL LOW (ref 3.5–5.0)
Alkaline Phosphatase: 81 U/L (ref 38–126)
Anion gap: 4 — ABNORMAL LOW (ref 5–15)
BUN: 15 mg/dL (ref 6–20)
CO2: 27 mmol/L (ref 22–32)
Calcium: 8.8 mg/dL — ABNORMAL LOW (ref 8.9–10.3)
Chloride: 106 mmol/L (ref 98–111)
Creatinine, Ser: 0.52 mg/dL (ref 0.44–1.00)
GFR, Estimated: >60 mL/min (ref >60)
Glucose, Bld: 222 mg/dL — ABNORMAL HIGH (ref 70–99)
Potassium: 3.8 mmol/L (ref 3.5–5.1)
Sodium: 137 mmol/L (ref 135–145)
Total Bilirubin: 0.3 mg/dL (ref 0.3–1.2)
Total Protein: 6.6 g/dL (ref 6.5–8.1)

## 2021-02-03 LAB — CBC WITH DIFFERENTIAL/PLATELET
Abs Immature Granulocytes: 0.01 10*3/uL (ref 0.00–0.07)
Basophils Absolute: 0 10*3/uL (ref 0.0–0.1)
Basophils Relative: 1 %
Eosinophils Absolute: 0.1 10*3/uL (ref 0.0–0.5)
Eosinophils Relative: 1 %
HCT: 36.5 % (ref 36.0–46.0)
Hemoglobin: 11.8 g/dL — ABNORMAL LOW (ref 12.0–15.0)
Immature Granulocytes: 0 %
Lymphocytes Relative: 37 %
Lymphs Abs: 2.3 10*3/uL (ref 0.7–4.0)
MCH: 25.8 pg — ABNORMAL LOW (ref 26.0–34.0)
MCHC: 32.3 g/dL (ref 30.0–36.0)
MCV: 79.9 fL — ABNORMAL LOW (ref 80.0–100.0)
Monocytes Absolute: 0.3 10*3/uL (ref 0.1–1.0)
Monocytes Relative: 4 %
Neutro Abs: 3.5 10*3/uL (ref 1.7–7.7)
Neutrophils Relative %: 57 %
Platelets: 303 10*3/uL (ref 150–400)
RBC: 4.57 MIL/uL (ref 3.87–5.11)
RDW: 13.1 % (ref 11.5–15.5)
WBC: 6.1 10*3/uL (ref 4.0–10.5)
nRBC: 0 % (ref 0.0–0.2)

## 2021-02-03 LAB — POCT URINALYSIS DIPSTICK, ED / UC
Bilirubin Urine: NEGATIVE
Glucose, UA: >=1000 mg/dL — AB
Ketones, ur: 15 mg/dL — AB
Leukocytes,Ua: NEGATIVE
Nitrite: NEGATIVE
Protein, ur: NEGATIVE mg/dL
Specific Gravity, Urine: 1.025 (ref 1.005–1.030)
Urobilinogen, UA: 1 mg/dL (ref 0.0–1.0)
pH: 5 (ref 5.0–8.0)

## 2021-02-03 LAB — LIPASE, BLOOD: Lipase: 28 U/L (ref 11–51)

## 2021-02-03 LAB — POC URINE PREG, ED: Preg Test, Ur: NEGATIVE

## 2021-02-03 MED ORDER — FAMOTIDINE IN NACL 20-0.9 MG/50ML-% IV SOLN
20.0000 mg | Freq: Once | INTRAVENOUS | Status: AC
  Administered 2021-02-03: 20 mg via INTRAVENOUS
  Filled 2021-02-03: qty 50

## 2021-02-03 MED ORDER — FAMOTIDINE 20 MG PO TABS: 20.0000 mg | ORAL_TABLET | Freq: Two times a day (BID) | ORAL | 0 refills | Status: DC

## 2021-02-03 MED ORDER — ONDANSETRON 4 MG PO TBDP: 4.0000 mg | ORAL_TABLET | Freq: Three times a day (TID) | ORAL | 0 refills | Status: DC | PRN

## 2021-02-03 MED ORDER — ALUM & MAG HYDROXIDE-SIMETH 200-200-20 MG/5ML PO SUSP
30.0000 mL | Freq: Once | ORAL | Status: AC
  Administered 2021-02-03: 30 mL via ORAL
  Filled 2021-02-03: qty 30

## 2021-02-03 MED ORDER — METOCLOPRAMIDE HCL 5 MG/ML IJ SOLN
10.0000 mg | Freq: Once | INTRAMUSCULAR | Status: AC
  Administered 2021-02-03: 10 mg via INTRAVENOUS
  Filled 2021-02-03: qty 2

## 2021-02-03 MED ORDER — IOHEXOL 350 MG/ML SOLN
75.0000 mL | Freq: Once | INTRAVENOUS | Status: AC | PRN
  Administered 2021-02-03: 75 mL via INTRAVENOUS

## 2021-02-03 MED ORDER — SODIUM CHLORIDE 0.9 % IV BOLUS
500.0000 mL | Freq: Once | INTRAVENOUS | Status: AC
  Administered 2021-02-03: 500 mL via INTRAVENOUS

## 2021-02-03 MED ORDER — ONDANSETRON 4 MG PO TBDP
ORAL_TABLET | ORAL | Status: AC
  Filled 2021-02-03: qty 1

## 2021-02-03 MED ORDER — ONDANSETRON 4 MG PO TBDP
4.0000 mg | ORAL_TABLET | Freq: Once | ORAL | Status: AC
  Administered 2021-02-03: 4 mg via ORAL

## 2021-02-03 MED ORDER — SODIUM CHLORIDE 0.9 % IV BOLUS
1000.0000 mL | Freq: Once | INTRAVENOUS | Status: AC
  Administered 2021-02-03: 1000 mL via INTRAVENOUS

## 2021-02-03 NOTE — Discharge Instructions (Addendum)
Take medications as needed. Follow-up with your primary care provider. Return to the ER for worsening pain, chest pain, shortness of breath, bloody stools.  Your work-up here was reassuring.  Please use the Pepcid and nausea medicine at home as needed.  I have also included a copy of your CT scan, as it shows some incidental findings that are not important emergently, but need to be followed by your primary care doctor.  1. No acute abnormality in the abdomen or pelvis. 2. A 2.0 cm soft tissue density in the anterior lower right pelvis is new compared to prior CT dated 06/08/2018. The appearance is nonspecific with differential considerations including enlarged pelvic lymph node, splenule, or possibly endometrioma. Recommend MRI of the pelvis on a non-emergent basis for further characterization. 3. Interval migration of the right tubal ligation clip, now located in the anterior left pelvis

## 2021-02-03 NOTE — ED Provider Notes (Signed)
[facility] EMERGENCY DEPARTMENT Provider Note   CSN: [id number] Arrival date & time: 02/03/21  1039     History No chief complaint on file.   Wanda Harris is a 40 y.o. female with a past medical history of NIDDM, asthma presenting to the ED with a chief complaint of epigastric pain.  States that for the past 4 days has been experiencing a burning sensation in her upper abdomen.  She reports continuous nausea and some episodes of emesis when symptoms began.  Denies any changes to bowel movements or urination.  No pelvic complaints.  She has tried Pepto-Bismol but states that she vomited immediately afterwards.  No sick contacts with similar symptoms.  No suspicious food intake.  Prior abdominal surgeries include C-section x2.  No cough, chest pain, shortness of breath.  HPI     Past Medical History:  Diagnosis Date   Asthma    no current med.   Closed fracture of one rib of left side 07/20/2017   left 9th   Finger fracture, right 07/20/2017   ring finger   Migraines    Non-insulin dependent type 2 diabetes mellitus [facility])     Patient Active Problem List   Diagnosis Date Noted   Postpartum care following cesarean delivery 01/12/2016   Vaginal burning 01/12/2016   Constipation 01/12/2016   Gestational diabetes mellitus (GDM), postpartum 01/12/2016   S/P repeat low transverse C-section 11/28/2015   Fetal macrosomia, delivered, current hospitalization 11/28/2015   Pyelonephritis affecting pregnancy in third trimester, antepartum 10/21/2015   Trichomonal vaginitis in pregnancy 08/06/2015   Supervision of young multigravida, antepartum 08/04/2015   GBS (group B streptococcus) UTI complicating pregnancy [id number]   Gestational diabetes mellitus (GDM) affecting pregnancy 08/04/2015   Uterine scar from previous surgery affecting pregnancy 08/04/2015    Past Surgical History:  Procedure Laterality Date   CESAREAN SECTION N/A 07/01/2013   Procedure:  Primary Cesarean Section Delivery Baby Boy @ 2038, Apgars 7/9;  Surgeon: Jonnie Kind, MD;  Location: [facility];  Service: Obstetrics;  Laterality: N/A;   CESAREAN SECTION WITH BILATERAL TUBAL LIGATION Bilateral 11/28/2015   Procedure: CESAREAN SECTION WITH BILATERAL TUBAL LIGATION;  Surgeon: Woodroe Mode, MD;  Location: [place];  Service: Obstetrics;  Laterality: Bilateral;   OPEN REDUCTION INTERNAL FIXATION (ORIF) PROXIMAL PHALANX Right 07/23/2017   Procedure: PERCUTANEOUS PINNING OF RIGHT RING FINGER FRACTURE;  Surgeon: Milly Jakob, MD;  Location: [place];  Service: Orthopedics;  Laterality: Right;     OB History     Gravida  6   Para  5   Term  5   Preterm      AB  1   Living  5      SAB  1   IAB  0   Ectopic      Multiple  0   Live Births  5           Family History  Problem Relation Age of Onset   Diabetes Mother    Hypertension Mother    Arthritis Mother    Hyperlipidemia Father    Heart disease Father    Stroke Father     Social History   Tobacco Use   Smoking status: Former    Types: Cigarettes    Quit date: 07/26/2007    Years since quitting: 13.5   Smokeless tobacco: Never  Vaping Use   Vaping Use: Never used  Substance Use Topics   Alcohol use:  Yes    Comment: occasionally   Drug use: No    Home Medications Prior to Admission medications   Medication Sig Start Date End Date Taking? Authorizing Provider  aspirin-acetaminophen-caffeine (EXCEDRIN MIGRAINE) [phone] MG tablet Take 1 tablet by mouth every 6 (six) hours as needed for headache.   Yes [provider]  famotidine (PEPCID) 20 MG tablet Take 1 tablet (20 mg total) by mouth 2 (two) times daily. 02/03/21  Yes Quillan Whitter, PA-C  metFORMIN (GLUCOPHAGE) 500 MG tablet Take 500 mg by mouth in the morning and at bedtime.   Yes [provider]  Lidocaine 3 % CREA Apply 1 g topically every 8 (eight) hours as needed. Patient not taking:  Reported on 02/03/2021 08/13/20   Wieters, Hallie C, PA-C  metroNIDAZOLE (FLAGYL) 500 MG tablet Take 1 tablet (500 mg total) by mouth 2 (two) times daily. Patient not taking: Reported on 02/03/2021 08/16/20   Chase Picket, MD  naproxen (NAPROSYN) 500 MG tablet Take 1 tablet (500 mg total) by mouth 2 (two) times daily. Patient not taking: Reported on 02/03/2021 08/13/20   Debara Pickett C, PA-C    Allergies    Patient has no known allergies.  Review of Systems   Review of Systems  Constitutional:  Negative for appetite change, chills and fever.  HENT:  Negative for ear pain, rhinorrhea, sneezing and sore throat.   Eyes:  Negative for photophobia and visual disturbance.  Respiratory:  Negative for cough, chest tightness, shortness of breath and wheezing.   Cardiovascular:  Negative for chest pain and palpitations.  Gastrointestinal:  Positive for abdominal pain, nausea and vomiting. Negative for blood in stool, constipation and diarrhea.  Genitourinary:  Negative for dysuria, hematuria and urgency.  Musculoskeletal:  Negative for myalgias.  Skin:  Negative for rash.  Neurological:  Negative for dizziness, weakness and light-headedness.   Physical Exam Updated Vital Signs BP 94/63   Pulse (!) 51   Temp 98.7 F (37.1 C)   Resp 16   SpO2 100%   Physical Exam Vitals and nursing note reviewed.  Constitutional:      General: She is not in acute distress.    Appearance: She is well-developed.  HENT:     Head: Normocephalic and atraumatic.     Nose: Nose normal.  Eyes:     General: No scleral icterus.       Left eye: No discharge.     Conjunctiva/sclera: Conjunctivae normal.  Cardiovascular:     Rate and Rhythm: Normal rate and regular rhythm.     Heart sounds: Normal heart sounds. No murmur heard.   No friction rub. No gallop.  Pulmonary:     Effort: Pulmonary effort is normal. No respiratory distress.     Breath sounds: Normal breath sounds.  Abdominal:     General: Bowel  sounds are normal. There is no distension.     Palpations: Abdomen is soft.     Tenderness: There is abdominal tenderness (epigastric). There is no guarding.  Musculoskeletal:        General: Normal range of motion.     Cervical back: Normal range of motion and neck supple.  Skin:    General: Skin is warm and dry.     Findings: No rash.  Neurological:     Mental Status: She is alert.     Motor: No abnormal muscle tone.     Coordination: Coordination normal.    ED Results / Procedures / Treatments   Labs (  all labs ordered are listed, but only abnormal results are displayed) Labs Reviewed - No data to display  EKG None  Radiology No results found.  Procedures Procedures   Medications Ordered in ED Medications  sodium chloride 0.9 % bolus 1,000 mL (0 mLs Intravenous Stopped 02/03/21 1409)  metoCLOPramide (REGLAN) injection 10 mg (10 mg Intravenous Given 02/03/21 1230)  famotidine (PEPCID) IVPB 20 mg premix (0 mg Intravenous Stopped 02/03/21 1307)  alum & mag hydroxide-simeth (MAALOX/MYLANTA) 200-200-20 MG/5ML suspension 30 mL (30 mLs Oral Given 02/03/21 1224)  sodium chloride 0.9 % bolus 500 mL (0 mLs Intravenous Stopped 02/03/21 1444)    ED Course  I have reviewed the triage vital signs and the nursing notes.  Pertinent labs & imaging results that were available during my care of the patient were reviewed by me and considered in my medical decision making (see chart for details).    MDM Rules/Calculators/A&P                           40 year old female with a past medical history of NIDDM, asthma presenting to the ED for epigastric pain, nausea and vomiting.  Symptoms have been going on for the past 4 days.  Denies any changes to bowel movements, urination, pelvic complaints, chest pain.  On exam abdomen with some epigastric tenderness.  No rebound or guarding.  No lower abdominal tenderness.  Her vital signs are within normal limits here.  She was seen at urgent care this  morning and blood work was obtained.  She has no leukocytosis or elevation in her creatinine, lipase or any electrolyte derangements.  Her pregnancy test was negative.  She is hyperglycemic but no sign of acidemia.  Will attempt to treat symptomatically and reassess.  On reassessment patient with improvement in her symptoms but continues to have pain and tenderness.  She remains hypotensive here to 90 systolic.  Chart review shows that she is typically in the 0000000 systolic.  States that she has never had an issue with her blood pressure before.  Due to her persistent symptoms will obtain CT of the abdomen pelvis to rule out structural cause of her symptoms.  Care handed off to oncoming provider.  If imaging is reassuring and her symptoms improved consider discharge with treatment for reflux.   Portions of this note were generated with [employer]. Dictation errors may occur despite best attempts at proofreading.  Final Clinical Impression(s) / ED Diagnoses Final diagnoses:  Gastroesophageal reflux disease, unspecified whether esophagitis present    Rx / DC Orders ED Discharge Orders          Ordered    famotidine (PEPCID) 20 MG tablet  2 times daily        02/03/21 1455             Delia Heady, PA-C 02/03/21 1520    Long, Wonda Olds, MD 02/04/21 [phone]

## 2021-02-03 NOTE — Discharge Instructions (Signed)
Go to the ER for further evaluation and management.  I am concerned that there is something going on in your abdomen such as pancreatitis and you likely need a CT scan which we do not have available.

## 2021-02-03 NOTE — ED Triage Notes (Signed)
Pt here sent from UC with c/o abd pain and n/v since Monday

## 2021-02-03 NOTE — ED Triage Notes (Signed)
Nausea and vomiting since Monday. Centralized and epigastric abdominal pain described as burning. Denies chest pain, SOB, fever, diarrhea. Pepto bismol and tums didn't help. Eating makes it worse.

## 2021-02-03 NOTE — ED Notes (Signed)
Patient transported to CT 

## 2021-02-03 NOTE — ED Provider Notes (Signed)
Epigastric pain Pending CT PO challenege Zofran to have at home  Vitals:   02/03/21 1500 02/03/21 1600  BP: (!) 100/59 104/66  Pulse: 69 (!) 53  Resp: 15 16  Temp:    SpO2: 100% 99%   On my repeat examination, the patient is resting comfortably.  Her blood pressure is 104/66.  CT scan came back with no acute abnormality, but it did show an abnormal swelling in the cul-de-sac that an outpatient repeat imaging is recommended.  The patient has a benign abdomen on my exam and is normotensive.  She has tolerated p.o. in the emergency department.  Will prescribe Zofran and Pepcid for her to go home on.  I believe that she is stable for discharge with a reassuring CT scan and normalization of her vital signs.  I informed the patient about the incidental findings of her CT scan, both in print and verbal format.  Patient is comfortable going home with the prescriptions.  She understands to return to the emergency department if she develops any severe fever, inability to tolerate p.o. intake.    Claud Kelp, MD 02/03/21 1719    Blanchie Dessert, MD 02/06/21 [phone]

## 2021-02-03 NOTE — ED Notes (Signed)
Patient is being discharged from the [facility] and sent to the Emergency Department via private vehicle. Per E. Raspet, PA, patient is stable but in need of higher level of care due to abd pain, likely need for CT scan. Patient is aware and verbalizes understanding of plan of care.  Vitals:   02/03/21 0845 02/03/21 0928  BP: (!) 97/56 (!) 96/57  Pulse: 63 (!) 55  Resp: 16 16  Temp: 98 F (36.7 C)   SpO2: 96% 100%

## 2021-02-03 NOTE — ED Provider Notes (Signed)
[facility]    CSN: [id number] Arrival date & time: 02/03/21  0805      History   Chief Complaint Chief Complaint  Patient presents with   Abdominal Pain   Emesis    HPI Wanda Harris is a 40 y.o. female.   Patient presents today with a 5-day history of generalized abdominal pain that is worse in the upper abdomen.  Reports pain is now rated 10 on a 0-10 pain scale that is gradually been worsening.  She describes this as a burning/gnawing sensation that is worse following oral intake.  She has been able to drink Pedialyte to keep that down but otherwise has had difficulty eating.  She has tried numerous over-the-counter medications including Pepto-Bismol and Tums and has been ineffective.  She denies any recent travel, unusual food intake, medication changes.  She does not take GLP-1 agonist denies history of hypertriglyceridemia.  She denies any recent alcohol consumption.  She denies history of gastrointestinal disorder including peptic ulcer disease, pancreatitis, GERD.  She denies previous abdominal surgery other than 2 cesarean sections; has gallbladder and appendix.  She is having difficulty with daily activities as result of symptoms.   Past Medical History:  Diagnosis Date   Asthma    no current med.   Closed fracture of one rib of left side 07/20/2017   left 9th   Finger fracture, right 07/20/2017   ring finger   Migraines    Non-insulin dependent type 2 diabetes mellitus [facility])     Patient Active Problem List   Diagnosis Date Noted   Postpartum care following cesarean delivery 01/12/2016   Vaginal burning 01/12/2016   Constipation 01/12/2016   Gestational diabetes mellitus (GDM), postpartum 01/12/2016   S/P repeat low transverse C-section 11/28/2015   Fetal macrosomia, delivered, current hospitalization 11/28/2015   Pyelonephritis affecting pregnancy in third trimester, antepartum 10/21/2015   Trichomonal vaginitis in pregnancy 08/06/2015    Supervision of young multigravida, antepartum 08/04/2015   GBS (group B streptococcus) UTI complicating pregnancy [id number]   Gestational diabetes mellitus (GDM) affecting pregnancy 08/04/2015   Uterine scar from previous surgery affecting pregnancy 08/04/2015    Past Surgical History:  Procedure Laterality Date   CESAREAN SECTION N/A 07/01/2013   Procedure: Primary Cesarean Section Delivery Baby Boy @ 2038, Apgars 7/9;  Surgeon: Jonnie Kind, MD;  Location: [facility] [facility];  Service: Obstetrics;  Laterality: N/A;   CESAREAN SECTION WITH BILATERAL TUBAL LIGATION Bilateral 11/28/2015   Procedure: CESAREAN SECTION WITH BILATERAL TUBAL LIGATION;  Surgeon: Woodroe Mode, MD;  Location: [place];  Service: Obstetrics;  Laterality: Bilateral;   OPEN REDUCTION INTERNAL FIXATION (ORIF) PROXIMAL PHALANX Right 07/23/2017   Procedure: PERCUTANEOUS PINNING OF RIGHT RING FINGER FRACTURE;  Surgeon: Milly Jakob, MD;  Location: [place];  Service: Orthopedics;  Laterality: Right;    OB History     Gravida  6   Para  5   Term  5   Preterm      AB  1   Living  5      SAB  1   IAB  0   Ectopic      Multiple  0   Live Births  5            Home Medications    Prior to Admission medications   Medication Sig Start Date End Date Taking? Authorizing Provider  Lidocaine 3 % CREA Apply 1 g topically every 8 (eight) hours as needed.  08/13/20   Wieters, Hallie C, PA-C  metroNIDAZOLE (FLAGYL) 500 MG tablet Take 1 tablet (500 mg total) by mouth 2 (two) times daily. 08/16/20   Chase Picket, MD  naproxen (NAPROSYN) 500 MG tablet Take 1 tablet (500 mg total) by mouth 2 (two) times daily. 08/13/20   Wieters, Hallie C, PA-C  metFORMIN (GLUCOPHAGE) 500 MG tablet Take 1 tablet (500 mg total) by mouth 2 (two) times daily with a meal. Patient not taking: Reported on 06/08/2018 01/12/16 08/13/20  Mumaw, Lauralyn Primes, DO    Family History Family History  Problem  Relation Age of Onset   Diabetes Mother    Hypertension Mother    Arthritis Mother    Hyperlipidemia Father    Heart disease Father    Stroke Father     Social History Social History   Tobacco Use   Smoking status: Former    Types: Cigarettes    Quit date: 07/26/2007    Years since quitting: 13.5   Smokeless tobacco: Never  Vaping Use   Vaping Use: Never used  Substance Use Topics   Alcohol use: Yes    Comment: occasionally   Drug use: No     Allergies   Patient has no known allergies.   Review of Systems Review of Systems  Constitutional:  Positive for activity change and appetite change. Negative for fatigue and fever.  Respiratory:  Negative for cough and shortness of breath.   Cardiovascular:  Negative for chest pain.  Gastrointestinal:  Positive for abdominal pain, nausea and vomiting. Negative for diarrhea.  Genitourinary:  Positive for dysuria. Negative for frequency and urgency.  Musculoskeletal:  Negative for arthralgias and myalgias.  Neurological:  Positive for light-headedness. Negative for dizziness and headaches.    Physical Exam Triage Vital Signs ED Triage Vitals  Enc Vitals Group     BP 02/03/21 0845 (!) 97/56     Pulse Rate 02/03/21 0845 63     Resp 02/03/21 0845 16     Temp 02/03/21 0845 98 F (36.7 C)     Temp Source 02/03/21 0845 Oral     SpO2 02/03/21 0845 96 %     Weight --      Height --      Head Circumference --      Peak Flow --      Pain Score 02/03/21 0847 10     Pain Loc --      Pain Edu? --      Excl. in [place]? --    No data found.  Updated Vital Signs BP (!) 96/57   Pulse (!) 55   Temp 98 F (36.7 C) (Oral)   Resp 16   SpO2 100%   Visual Acuity Right Eye Distance:   Left Eye Distance:   Bilateral Distance:    Right Eye Near:   Left Eye Near:    Bilateral Near:     Physical Exam Vitals reviewed.  Constitutional:      General: She is awake. She is not in acute distress.    Appearance: Normal appearance. She  is normal weight. She is not ill-appearing.     Comments: Very pleasant female appears stated age in no acute distress sitting comfortably in exam room  HENT:     Head: Normocephalic and atraumatic.     Mouth/Throat:     Mouth: Mucous membranes are moist.     Pharynx: Uvula midline. No oropharyngeal exudate or posterior oropharyngeal erythema.  Cardiovascular:  Rate and Rhythm: Normal rate and regular rhythm.     Heart sounds: Normal heart sounds, S1 normal and S2 normal. No murmur heard. Pulmonary:     Effort: Pulmonary effort is normal.     Breath sounds: Normal breath sounds. No wheezing, rhonchi or rales.     Comments: Clear to auscultation bilaterally Abdominal:     General: Bowel sounds are normal.     Palpations: Abdomen is soft.     Tenderness: There is generalized abdominal tenderness. There is no right CVA tenderness, left CVA tenderness, guarding or rebound. Negative signs include Murphy's sign, Rovsing's sign, McBurney's sign, psoas sign and obturator sign.     Comments: Generalized tenderness palpation throughout abdomen.  No evidence of acute abdomen on physical exam.  Psychiatric:        Behavior: Behavior is cooperative.     UC Treatments / Results  Labs (all labs ordered are listed, but only abnormal results are displayed) Labs Reviewed  POCT URINALYSIS DIPSTICK, ED / UC - Abnormal; Notable for the following components:      Result Value   Glucose, UA >=1000 (*)    Ketones, ur 15 (*)    Hgb urine dipstick TRACE (*)    All other components within normal limits  CBC WITH DIFFERENTIAL/PLATELET  COMPREHENSIVE METABOLIC PANEL  LIPASE, BLOOD  POC URINE PREG, ED    EKG   Radiology No results found.  Procedures Procedures (including critical care time)  Medications Ordered in UC Medications  ondansetron (ZOFRAN-ODT) disintegrating tablet 4 mg (4 mg Oral Given 02/03/21 0930)    Initial Impression / Assessment and Plan / UC Course  I have reviewed the  triage vital signs and the nursing notes.  Pertinent labs & imaging results that were available during my care of the patient were reviewed by me and considered in my medical decision making (see chart for details).      Patient was given Zofran without significant improvement of symptoms.  Discussed that we are limited in what resources we have available in urgent care.  Patient was initially hesitant to go to the ER so basic labs were obtained-results pending.  Upon reevaluation she remained mildly hypotensive we discussed that the safest thing to do would be to go to the emergency room in order to obtain CT scan which is not available in urgent care.  Patient was agreeable and will go directly to emergency room at [facility] following visit today.  She declined EMS transport.  Patient will go directly to ER via private vehicle following visit today.  Final Clinical Impressions(s) / UC Diagnoses   Final diagnoses:  Epigastric abdominal pain  Nausea and vomiting, intractability of vomiting not specified, unspecified vomiting type  Hypotension, unspecified hypotension type     Discharge Instructions      Go to the ER for further evaluation and management.  I am concerned that there is something going on in your abdomen such as pancreatitis and you likely need a CT scan which we do not have available.     ED Prescriptions   None    PDMP not reviewed this encounter.   Terrilee Croak, PA-C 02/03/21 1003

## 2021-02-06 ENCOUNTER — Telehealth (HOSPITAL_COMMUNITY): Payer: Self-pay | Admitting: Emergency Medicine

## 2021-02-06 MED ORDER — METFORMIN HCL 500 MG PO TABS: 500.0000 mg | ORAL_TABLET | Freq: Two times a day (BID) | ORAL | 0 refills | Status: DC

## 2021-02-23 ENCOUNTER — Encounter: Payer: Self-pay | Admitting: Obstetrics & Gynecology

## 2021-03-30 ENCOUNTER — Ambulatory Visit: Payer: Self-pay | Admitting: Obstetrics and Gynecology

## 2021-05-04 ENCOUNTER — Other Ambulatory Visit: Payer: Self-pay

## 2021-05-04 ENCOUNTER — Encounter: Payer: Self-pay | Admitting: Obstetrics & Gynecology

## 2021-05-04 ENCOUNTER — Other Ambulatory Visit (HOSPITAL_COMMUNITY)
Admission: RE | Admit: 2021-05-04 | Discharge: 2021-05-04 | Disposition: A | Discharge status: Home / Self Care | Payer: Self-pay | Source: Ambulatory Visit | Attending: Obstetrics and Gynecology | Admitting: Obstetrics and Gynecology

## 2021-05-04 ENCOUNTER — Ambulatory Visit (INDEPENDENT_AMBULATORY_CARE_PROVIDER_SITE_OTHER): Payer: Self-pay | Admitting: Obstetrics & Gynecology

## 2021-05-04 VITALS — BP 98/64 | HR 74 | Ht 60.0 in | Wt 145.8 lb

## 2021-05-04 DIAGNOSIS — B9689 Other specified bacterial agents as the cause of diseases classified elsewhere: Secondary | ICD-10-CM

## 2021-05-04 DIAGNOSIS — N760 Acute vaginitis: Secondary | ICD-10-CM

## 2021-05-04 DIAGNOSIS — B3731 Acute candidiasis of vulva and vagina: Secondary | ICD-10-CM

## 2021-05-04 DIAGNOSIS — R1900 Intra-abdominal and pelvic swelling, mass and lump, unspecified site: Secondary | ICD-10-CM

## 2021-05-04 MED ORDER — NYSTATIN 100000 UNIT/GM EX CREA: TOPICAL_CREAM | CUTANEOUS | 1 refills | Status: DC

## 2021-05-04 MED ORDER — FLUCONAZOLE 150 MG PO TABS: 150.0000 mg | ORAL_TABLET | ORAL | 3 refills | Status: DC

## 2021-05-04 NOTE — Progress Notes (Signed)
GYNECOLOGY OFFICE VISIT NOTE  History:   Wanda Harris is a 40 y.o. [id number] here today for evaluation of vulvar irritation and abnormal discharge for a few days. History of Type 2 DM.  Also wants to follow up pelvic mass seen on CT scan during her ED visit in 01/2021, denies any pelvic pain. . She denies any abnormal vaginal bleeding or other concerns.    Past Medical History:  Diagnosis Date   Asthma    no current med.   Closed fracture of one rib of left side 07/20/2017   left 9th   Finger fracture, right 07/20/2017   ring finger   Migraines    Non-insulin dependent type 2 diabetes mellitus [facility])     Past Surgical History:  Procedure Laterality Date   CESAREAN SECTION N/A 07/01/2013   Procedure: Primary Cesarean Section Delivery Baby Boy @ 2038, Apgars 7/9;  Surgeon: Jonnie Kind, MD;  Location: [place] [facility];  Service: Obstetrics;  Laterality: N/A;   CESAREAN SECTION WITH BILATERAL TUBAL LIGATION Bilateral 11/28/2015   Procedure: CESAREAN SECTION WITH BILATERAL TUBAL LIGATION;  Surgeon: Woodroe Mode, MD;  Location: [place];  Service: Obstetrics;  Laterality: Bilateral;   OPEN REDUCTION INTERNAL FIXATION (ORIF) PROXIMAL PHALANX Right 07/23/2017   Procedure: PERCUTANEOUS PINNING OF RIGHT RING FINGER FRACTURE;  Surgeon: Milly Jakob, MD;  Location: [place];  Service: Orthopedics;  Laterality: Right;    The following portions of the patient's history were reviewed and updated as appropriate: allergies, current medications, past family history, past medical history, past social history, past surgical history and problem list.   Health Maintenance:  Normal pap in 2017.   Review of Systems:  Pertinent items noted in HPI and remainder of comprehensive ROS otherwise negative.  Physical Exam:  BP 98/64   Pulse 74   Ht 5' (1.524 m)   Wt 145 lb 12.8 oz (66.1 kg)   BMI 28.47 kg/m  CONSTITUTIONAL: Well-developed, well-nourished female in no acute  distress.  HEENT:  Normocephalic, atraumatic. External right and left ear normal. No scleral icterus.  NECK: Normal range of motion, supple, no masses noted on observation SKIN: No rash noted. Not diaphoretic. No erythema. No pallor. MUSCULOSKELETAL: Normal range of motion. No edema noted. NEUROLOGIC: Alert and oriented to person, place, and time. Normal muscle tone coordination. No cranial nerve deficit noted. PSYCHIATRIC: Normal mood and affect. Normal behavior. Normal judgment and thought content. CARDIOVASCULAR: Normal heart rate noted RESPIRATORY: Effort and breath sounds normal, no problems with respiration noted ABDOMEN: No masses noted. No other overt distention noted.   PELVIC:  Diffuse erythema noted on external genitalia with some superficial excoriations in posterior fourchette. White discharge noted, testing sample obtained.  Performed in the presence of a chaperone  Labs and Imaging CT ABDOMEN PELVIS W CONTRAST  Result Date: 02/03/2021 CLINICAL DATA:  Epigastric pain with nausea and vomiting since Monday. EXAM: CT ABDOMEN AND PELVIS WITH CONTRAST TECHNIQUE: Multidetector CT imaging of the abdomen and pelvis was performed using the standard protocol following bolus administration of intravenous contrast. CONTRAST:  54mL OMNIPAQUE IOHEXOL 350 MG/ML SOLN COMPARISON:  CT abdomen pelvis 06/08/2018 FINDINGS: Lower chest: No acute abnormality. Hepatobiliary: No focal liver abnormality is seen. No gallstones, gallbladder wall thickening, or biliary dilatation. Pancreas: Unremarkable. No pancreatic ductal dilatation or surrounding inflammatory changes. Spleen: Normal in size without focal abnormality. Adrenals/Urinary Tract: Adrenal glands are unremarkable. Kidneys are normal, without renal calculi, focal lesion, or hydronephrosis. Bladder is unremarkable. Stomach/Bowel: Stomach  is within normal limits. Appendix appears normal. No evidence of bowel wall thickening, distention, or inflammatory  changes. Moderate fecal burden. Vascular/Lymphatic: No significant vascular findings are present. No enlarged abdominal or pelvic lymph nodes. Reproductive: Since 06/08/2018 there has been migration of the right tubal ligation clip, now located in the anterior left pelvis (series 3, image 63). Uterus and adnexa are otherwise unremarkable. Other: In the anterior low right pelvis, within the vesicouterine pouch, there is a circumscribed 2.0 x 1.4 x 1.2 cm soft tissue density mass which is new compared to prior CT abdomen pelvis 06/08/2018. No calcifications. No associated inflammatory changes. There is a preserved fat plane between the mass and both the uterus and urinary bladder. Musculoskeletal: No acute or significant osseous findings. IMPRESSION: 1. No acute abnormality in the abdomen or pelvis. 2. A 2.0 cm soft tissue density in the anterior lower right pelvis is new compared to prior CT dated 06/08/2018. The appearance is nonspecific with differential considerations including enlarged pelvic lymph node, splenule, or possibly endometrioma. Recommend MRI of the pelvis on a non-emergent basis for further characterization. 3. Interval migration of the right tubal ligation clip, now located in the anterior left pelvis. Electronically Signed   By: Ileana Roup M.D.   On: 02/03/2021 16:46       Assessment and Plan:     1. Vulvovaginitis 2. Candidal vulvitis Examination findings remarkable for yeast skin infection, treatment prescribed. Will follow up test results also. Proper vulvar hygiene emphasized: discussed avoidance of perfumed soaps, detergents, lotions and any type of douches; in addition to wearing cotton underwear and no underwear at night.   - Cervicovaginal ancillary only( [place]) - fluconazole (DIFLUCAN) 150 MG tablet; Take 1 tablet (150 mg total) by mouth every 3 (three) days. For three doses  Dispense: 3 tablet; Refill: 3 - nystatin cream (MYCOSTATIN); Apply externally for fourteen ndays   Dispense: 30 g; Refill: 1  3. Pelvic mass in female Nonspecific incidental finding, no concerning characteristics on CT scan.  Discussed with patient, emphasized it is likely benign, she desires further evaluation as recommended. MRI ordered as per Radiology recommendations, will follow up results and manage accordingly. - MR PELVIS W WO CONTRAST; Future  Routine preventative health maintenance measures emphasized, referred to [facility] for pap smear. Please refer to After Visit Summary for other counseling recommendations.   Return for any gynecologic concerns.    I spent 20 minutes dedicated to the care of this patient including pre-visit review of records, face to face time with the patient discussing her conditions and treatments and post visit orders.    Verita Schneiders, MD, [place] for [employer], [place]

## 2021-05-05 LAB — CERVICOVAGINAL ANCILLARY ONLY
Bacterial Vaginitis (gardnerella): POSITIVE — AB
Candida Glabrata: NEGATIVE
Candida Vaginitis: POSITIVE — AB
Chlamydia: NEGATIVE
Comment: NEGATIVE
Comment: NEGATIVE
Comment: NEGATIVE
Comment: NEGATIVE
Comment: NEGATIVE
Comment: NORMAL
Neisseria Gonorrhea: NEGATIVE
Trichomonas: NEGATIVE

## 2021-05-05 MED ORDER — METRONIDAZOLE 500 MG PO TABS: 500.0000 mg | ORAL_TABLET | Freq: Two times a day (BID) | ORAL | 0 refills | Status: AC

## 2021-05-05 NOTE — Addendum Note (Signed)
Addended by: Verita Schneiders A on: 05/05/2021 06:05 PM   Modules accepted: Orders

## 2021-05-15 ENCOUNTER — Other Ambulatory Visit: Payer: Self-pay

## 2021-05-15 ENCOUNTER — Ambulatory Visit
Admission: EM | Admit: 2021-05-15 | Discharge: 2021-05-15 | Disposition: A | Discharge status: Home / Self Care | Payer: Self-pay | Attending: Physician Assistant | Admitting: Physician Assistant

## 2021-05-15 DIAGNOSIS — J040 Acute laryngitis: Secondary | ICD-10-CM | POA: Insufficient documentation

## 2021-05-15 DIAGNOSIS — J029 Acute pharyngitis, unspecified: Secondary | ICD-10-CM | POA: Insufficient documentation

## 2021-05-15 DIAGNOSIS — J069 Acute upper respiratory infection, unspecified: Secondary | ICD-10-CM | POA: Insufficient documentation

## 2021-05-15 LAB — POCT RAPID STREP A (OFFICE): Rapid Strep A Screen: NEGATIVE

## 2021-05-15 MED ORDER — PREDNISONE 20 MG PO TABS: 40.0000 mg | ORAL_TABLET | Freq: Every day | ORAL | 0 refills | Status: AC

## 2021-05-15 NOTE — ED Triage Notes (Signed)
Pt c/o sore throat, difficulty swallowing, and loss of voice since Friday.

## 2021-05-15 NOTE — ED Provider Notes (Signed)
[facility]    CSN: [id number] Arrival date & time: 05/15/21  0934      History   Chief Complaint Chief Complaint  Patient presents with   Sore Throat    HPI Wanda Harris is a 40 y.o. female.   Patient here today for evaluation of sore throat, difficulty swallowing, loss of voice that started 3 days ago. She has had some cough as well. She denies fever. She has tried thera flu without significant relief. She has not had vomiting or diarrhea.   The history is provided by the patient.  Sore Throat Pertinent negatives include no abdominal pain and no shortness of breath.   Past Medical History:  Diagnosis Date   Asthma    no current med.   Closed fracture of one rib of left side 07/20/2017   left 9th   Finger fracture, right 07/20/2017   ring finger   Migraines    Non-insulin dependent type 2 diabetes mellitus [facility])     Patient Active Problem List   Diagnosis Date Noted   Postpartum care following cesarean delivery 01/12/2016   Vaginal burning 01/12/2016   Constipation 01/12/2016   Gestational diabetes mellitus (GDM), postpartum 01/12/2016   S/P repeat low transverse C-section 11/28/2015   Fetal macrosomia, delivered, current hospitalization 11/28/2015   Pyelonephritis affecting pregnancy in third trimester, antepartum 10/21/2015   Trichomonal vaginitis in pregnancy 08/06/2015   Supervision of young multigravida, antepartum 08/04/2015   GBS (group B streptococcus) UTI complicating pregnancy 79/89/2119   Gestational diabetes mellitus (GDM) affecting pregnancy 08/04/2015   Uterine scar from previous surgery affecting pregnancy 08/04/2015    Past Surgical History:  Procedure Laterality Date   CESAREAN SECTION N/A 07/01/2013   Procedure: Primary Cesarean Section Delivery Baby Boy @ 2038, Apgars 7/9;  Surgeon: Jonnie Kind, MD;  Location: [place];  Service: Obstetrics;  Laterality: N/A;   CESAREAN SECTION WITH BILATERAL TUBAL LIGATION Bilateral  11/28/2015   Procedure: CESAREAN SECTION WITH BILATERAL TUBAL LIGATION;  Surgeon: Woodroe Mode, MD;  Location: [place];  Service: Obstetrics;  Laterality: Bilateral;   OPEN REDUCTION INTERNAL FIXATION (ORIF) PROXIMAL PHALANX Right 07/23/2017   Procedure: PERCUTANEOUS PINNING OF RIGHT RING FINGER FRACTURE;  Surgeon: Milly Jakob, MD;  Location: [place];  Service: Orthopedics;  Laterality: Right;    OB History     Gravida  6   Para  5   Term  5   Preterm      AB  1   Living  5      SAB  1   IAB  0   Ectopic      Multiple  0   Live Births  5            Home Medications    Prior to Admission medications   Medication Sig Start Date End Date Taking? Authorizing Provider  predniSONE (DELTASONE) 20 MG tablet Take 2 tablets (40 mg total) by mouth daily with breakfast for 5 days. 05/15/21 05/20/21 Yes Francene Finders, PA-C  aspirin-acetaminophen-caffeine (EXCEDRIN MIGRAINE) [phone] MG tablet Take 1 tablet by mouth every 6 (six) hours as needed for headache. Patient not taking: Reported on 05/04/2021    [provider]  famotidine (PEPCID) 20 MG tablet Take 1 tablet (20 mg total) by mouth 2 (two) times daily. Patient not taking: Reported on 05/04/2021 02/03/21   Delia Heady, PA-C  fluconazole (DIFLUCAN) 150 MG tablet Take 1 tablet (150 mg total) by mouth  every 3 (three) days. For three doses 05/04/21   Anyanwu, Ugonna A, MD  Lidocaine 3 % CREA Apply 1 g topically every 8 (eight) hours as needed. Patient not taking: No sig reported 08/13/20   Wieters, Hallie C, PA-C  metFORMIN (GLUCOPHAGE) 500 MG tablet Take 1 tablet (500 mg total) by mouth in the morning and at bedtime. 02/06/21 03/08/21  Raspet, Derry Skill, PA-C  naproxen (NAPROSYN) 500 MG tablet Take 1 tablet (500 mg total) by mouth 2 (two) times daily. Patient not taking: No sig reported 08/13/20   Wieters, Hallie C, PA-C  nystatin cream (MYCOSTATIN) Apply externally for fourteen ndays  05/04/21   Anyanwu, Sallyanne Havers, MD  ondansetron (ZOFRAN ODT) 4 MG disintegrating tablet Take 1 tablet (4 mg total) by mouth every 8 (eight) hours as needed for nausea or vomiting. Patient not taking: Reported on 05/04/2021 02/03/21   Claud Kelp, MD    Family History Family History  Problem Relation Age of Onset   Diabetes Mother    Hypertension Mother    Arthritis Mother    Hyperlipidemia Father    Heart disease Father    Stroke Father     Social History Social History   Tobacco Use   Smoking status: Former    Types: Cigarettes    Quit date: 07/26/2007    Years since quitting: 13.8   Smokeless tobacco: Never  Vaping Use   Vaping Use: Never used  Substance Use Topics   Alcohol use: Yes    Comment: occasionally   Drug use: No     Allergies   Patient has no known allergies.   Review of Systems Review of Systems  Constitutional:  Negative for chills and fever.  HENT:  Positive for congestion, sore throat, trouble swallowing and voice change. Negative for ear pain and sinus pressure.   Eyes:  Negative for discharge and redness.  Respiratory:  Positive for cough. Negative for shortness of breath and wheezing.   Gastrointestinal:  Negative for abdominal pain, diarrhea, nausea and vomiting.    Physical Exam Triage Vital Signs ED Triage Vitals [05/15/21 1056]  Enc Vitals Group     BP 96/62     Pulse Rate 67     Resp 18     Temp 98 F (36.7 C)     Temp Source Oral     SpO2 98 %     Weight      Height      Head Circumference      Peak Flow      Pain Score 10     Pain Loc      Pain Edu?      Excl. in [place]?    No data found.  Updated Vital Signs BP 96/62 (BP Location: Left Arm)   Pulse 67   Temp 98 F (36.7 C) (Oral)   Resp 18   LMP 04/19/2021   SpO2 98%   Physical Exam Vitals and nursing note reviewed.  Constitutional:      General: She is not in acute distress.    Appearance: Normal appearance. She is not ill-appearing.  HENT:     Head:  Normocephalic and atraumatic.     Nose: Congestion (mild) present.     Mouth/Throat:     Mouth: Mucous membranes are moist.     Pharynx: Posterior oropharyngeal erythema present. No oropharyngeal exudate.     Comments: Barely audible voice- strained Eyes:     Conjunctiva/sclera: Conjunctivae normal.  Cardiovascular:  Rate and Rhythm: Normal rate and regular rhythm.     Heart sounds: Normal heart sounds. No murmur heard. Pulmonary:     Effort: Pulmonary effort is normal. No respiratory distress.     Breath sounds: Normal breath sounds. No wheezing, rhonchi or rales.  Skin:    General: Skin is warm and dry.  Neurological:     Mental Status: She is alert.  Psychiatric:        Mood and Affect: Mood normal.        Thought Content: Thought content normal.     UC Treatments / Results  Labs (all labs ordered are listed, but only abnormal results are displayed) Labs Reviewed  CULTURE, GROUP A STREP [facility])  POCT RAPID STREP A (OFFICE)    EKG   Radiology No results found.  Procedures Procedures (including critical care time)  Medications Ordered in UC Medications - No data to display  Initial Impression / Assessment and Plan / UC Course  I have reviewed the triage vital signs and the nursing notes.  Pertinent labs & imaging results that were available during my care of the patient were reviewed by me and considered in my medical decision making (see chart for details).   Prednisone prescribed to hopefully help with discomfort and irritation of throat/ laryngitis. Recommend symptomatic treatment otherwise and follow up with any further concerns.   Final Clinical Impressions(s) / UC Diagnoses   Final diagnoses:  Sore throat  Laryngitis  Acute upper respiratory infection   Discharge Instructions   None    ED Prescriptions     Medication Sig Dispense Auth. Provider   predniSONE (DELTASONE) 20 MG tablet Take 2 tablets (40 mg total) by mouth daily with breakfast for  5 days. 10 tablet Francene Finders, PA-C      PDMP not reviewed this encounter.   Francene Finders, PA-C 05/15/21 1137

## 2021-05-18 LAB — CULTURE, GROUP A STREP (THRC)

## 2021-05-25 ENCOUNTER — Ambulatory Visit
Admission: RE | Admit: 2021-05-25 | Discharge: 2021-05-25 | Disposition: A | Discharge status: Home / Self Care | Payer: Self-pay | Source: Ambulatory Visit | Attending: Obstetrics & Gynecology | Admitting: Obstetrics & Gynecology

## 2021-05-25 ENCOUNTER — Other Ambulatory Visit: Payer: Self-pay

## 2021-05-25 DIAGNOSIS — R1900 Intra-abdominal and pelvic swelling, mass and lump, unspecified site: Secondary | ICD-10-CM

## 2021-05-25 MED ORDER — GADOBENATE DIMEGLUMINE 529 MG/ML IV SOLN
13.0000 mL | Freq: Once | INTRAVENOUS | Status: AC | PRN
  Administered 2021-05-25: 13 mL via INTRAVENOUS

## 2021-05-29 ENCOUNTER — Telehealth: Payer: Self-pay

## 2021-05-29 NOTE — Telephone Encounter (Signed)
Call placed to pt. Spoke with pt. Pt given results and recommendations per Dr Harolyn Rutherford. Pt verbalized understanding and agreeable to plan of care.  Colletta Maryland, RN

## 2021-05-29 NOTE — Telephone Encounter (Signed)
-----   Message from Osborne Oman, MD sent at 05/27/2021 10:01 AM EST ----- Much smaller mass seen on MRI of pelvis (less than 1 cm), this is a benign mass, likely as a result the way her cesarean section scar healed. No intervention or follow up needed.  Please call to inform patient of results and recommendations.

## 2021-08-09 ENCOUNTER — Ambulatory Visit: Payer: Self-pay

## 2021-08-09 DIAGNOSIS — Z124 Encounter for screening for malignant neoplasm of cervix: Secondary | ICD-10-CM

## 2021-08-23 ENCOUNTER — Ambulatory Visit: Payer: Self-pay

## 2021-08-23 DIAGNOSIS — Z124 Encounter for screening for malignant neoplasm of cervix: Secondary | ICD-10-CM

## 2021-11-09 ENCOUNTER — Ambulatory Visit
Admission: EM | Admit: 2021-11-09 | Discharge: 2021-11-09 | Disposition: A | Discharge status: Home / Self Care | Payer: Self-pay | Attending: Emergency Medicine | Admitting: Emergency Medicine

## 2021-11-09 DIAGNOSIS — H6091 Unspecified otitis externa, right ear: Secondary | ICD-10-CM

## 2021-11-09 MED ORDER — OFLOXACIN 0.3 % OT SOLN: 3.0000 [drp] | Freq: Every day | OTIC | 0 refills | Status: AC

## 2021-11-09 NOTE — ED Triage Notes (Signed)
Patient presents to Urgent Care with complaints of otalgia on R side since last week. Patient reports otc pain medication for pain.

## 2021-11-09 NOTE — Discharge Instructions (Signed)
Please use ear drops as prescribed.  Please return to the urgent care or emergency department if symptoms worsen or do not improve.

## 2021-11-09 NOTE — ED Provider Notes (Signed)
[facility]    CSN: [id number] Arrival date & time: 11/09/21  1900     History   Chief Complaint Chief Complaint  Patient presents with   Otalgia    HPI Wanda Harris is a 41 y.o. female.  Presents with right ear pain and fullness. One week ago she got water in her ear that wouldn't come out. Developed ear pain that has continued. Tried ear wax removal which didn't provide relief. Has tried ibuprofen. Denies fever, hearing loss, ear drainage, headache, vision changes.   Past Medical History:  Diagnosis Date   Asthma    no current med.   Closed fracture of one rib of left side 07/20/2017   left 9th   Finger fracture, right 07/20/2017   ring finger   Migraines    Non-insulin dependent type 2 diabetes mellitus [facility])     Patient Active Problem List   Diagnosis Date Noted   Postpartum care following cesarean delivery 01/12/2016   Vaginal burning 01/12/2016   Constipation 01/12/2016   Gestational diabetes mellitus (GDM), postpartum 01/12/2016   S/P repeat low transverse C-section 11/28/2015   Fetal macrosomia, delivered, current hospitalization 11/28/2015   Pyelonephritis affecting pregnancy in third trimester, antepartum 10/21/2015   Trichomonal vaginitis in pregnancy 08/06/2015   Supervision of young multigravida, antepartum 08/04/2015   GBS (group B streptococcus) UTI complicating pregnancy 28/63/8177   Gestational diabetes mellitus (GDM) affecting pregnancy 08/04/2015   Uterine scar from previous surgery affecting pregnancy 08/04/2015    Past Surgical History:  Procedure Laterality Date   CESAREAN SECTION N/A 07/01/2013   Procedure: Primary Cesarean Section Delivery Baby Boy @ 2038, Apgars 7/9;  Surgeon: Jonnie Kind, MD;  Location: [place] [facility];  Service: Obstetrics;  Laterality: N/A;   CESAREAN SECTION WITH BILATERAL TUBAL LIGATION Bilateral 11/28/2015   Procedure: CESAREAN SECTION WITH BILATERAL TUBAL LIGATION;  Surgeon: Woodroe Mode, MD;   Location: [place];  Service: Obstetrics;  Laterality: Bilateral;   OPEN REDUCTION INTERNAL FIXATION (ORIF) PROXIMAL PHALANX Right 07/23/2017   Procedure: PERCUTANEOUS PINNING OF RIGHT RING FINGER FRACTURE;  Surgeon: Milly Jakob, MD;  Location: [place];  Service: Orthopedics;  Laterality: Right;    OB History     Gravida  6   Para  5   Term  5   Preterm      AB  1   Living  5      SAB  1   IAB  0   Ectopic      Multiple  0   Live Births  5            Home Medications    Prior to Admission medications   Medication Sig Start Date End Date Taking? Authorizing Provider  ofloxacin (FLOXIN) 0.3 % OTIC solution Place 3 drops into the right ear daily for 7 days. 11/09/21 11/16/21 Yes Nam Vossler, Wells Guiles, PA-C  aspirin-acetaminophen-caffeine (EXCEDRIN MIGRAINE) [phone] MG tablet Take 1 tablet by mouth every 6 (six) hours as needed for headache. Patient not taking: Reported on 05/04/2021    [provider]  famotidine (PEPCID) 20 MG tablet Take 1 tablet (20 mg total) by mouth 2 (two) times daily. Patient not taking: Reported on 05/04/2021 02/03/21   Delia Heady, PA-C  fluconazole (DIFLUCAN) 150 MG tablet Take 1 tablet (150 mg total) by mouth every 3 (three) days. For three doses 05/04/21   Anyanwu, Ugonna A, MD  Lidocaine 3 % CREA Apply 1 g topically every 8 (eight)  hours as needed. Patient not taking: No sig reported 08/13/20   Wieters, Hallie C, PA-C  metFORMIN (GLUCOPHAGE) 500 MG tablet Take 1 tablet (500 mg total) by mouth in the morning and at bedtime. 02/06/21 03/08/21  Raspet, Derry Skill, PA-C  naproxen (NAPROSYN) 500 MG tablet Take 1 tablet (500 mg total) by mouth 2 (two) times daily. Patient not taking: No sig reported 08/13/20   Wieters, Hallie C, PA-C  nystatin cream (MYCOSTATIN) Apply externally for fourteen ndays 05/04/21   Anyanwu, Sallyanne Havers, MD  ondansetron (ZOFRAN ODT) 4 MG disintegrating tablet Take 1 tablet (4 mg total) by mouth  every 8 (eight) hours as needed for nausea or vomiting. Patient not taking: Reported on 05/04/2021 02/03/21   Claud Kelp, MD    Family History Family History  Problem Relation Age of Onset   Diabetes Mother    Hypertension Mother    Arthritis Mother    Hyperlipidemia Father    Heart disease Father    Stroke Father     Social History Social History   Tobacco Use   Smoking status: Former    Types: Cigarettes    Quit date: 07/26/2007    Years since quitting: 14.3   Smokeless tobacco: Never  Vaping Use   Vaping Use: Never used  Substance Use Topics   Alcohol use: Yes    Comment: occasionally   Drug use: No     Allergies   Patient has no known allergies.   Review of Systems Review of Systems  HENT:  Positive for ear pain.   As per HPI  Physical Exam Triage Vital Signs ED Triage Vitals  Enc Vitals Group     BP 11/09/21 1911 107/68     Pulse Rate 11/09/21 1911 80     Resp 11/09/21 1911 12     Temp 11/09/21 1911 98.2 F (36.8 C)     Temp Source 11/09/21 1911 Oral     SpO2 11/09/21 1911 98 %     Weight --      Height --      Head Circumference --      Peak Flow --      Pain Score 11/09/21 1920 9     Pain Loc --      Pain Edu? --      Excl. in [place]? --    No data found.  Updated Vital Signs BP 107/68 (BP Location: Left Arm)   Pulse 80   Temp 98.2 F (36.8 C) (Oral)   Resp 12   SpO2 98%    Physical Exam Vitals and nursing note reviewed.  Constitutional:      General: She is not in acute distress. HENT:     Right Ear: Tympanic membrane normal. No decreased hearing noted. Swelling and tenderness present. No drainage. There is no impacted cerumen. No mastoid tenderness.     Left Ear: Tympanic membrane and ear canal normal. No decreased hearing noted.     Ears:     Comments: Right ear tragus pain when pressed, pain with ear exam. Right canal is edematous with swelling. TM not perforated     Nose: Nose normal.     Mouth/Throat:     Mouth: Mucous  membranes are moist.     Pharynx: Oropharynx is clear.  Eyes:     Extraocular Movements: Extraocular movements intact.     Conjunctiva/sclera: Conjunctivae normal.     Pupils: Pupils are equal, round, and reactive to light.  Cardiovascular:  Rate and Rhythm: Normal rate and regular rhythm.     Heart sounds: Normal heart sounds.  Pulmonary:     Effort: Pulmonary effort is normal.     Breath sounds: Normal breath sounds.  Musculoskeletal:     Cervical back: Full passive range of motion without pain.  Lymphadenopathy:     Cervical: No cervical adenopathy.  Neurological:     Mental Status: She is alert and oriented to person, place, and time.     UC Treatments / Results  Labs (all labs ordered are listed, but only abnormal results are displayed) Labs Reviewed - No data to display  EKG  Radiology No results found.  Procedures Procedures (including critical care time)  Medications Ordered in UC Medications - No data to display  Initial Impression / Assessment and Plan / UC Course  I have reviewed the triage vital signs and the nursing notes.  Pertinent labs & imaging results that were available during my care of the patient were reviewed by me and considered in my medical decision making (see chart for details).  Ear exam and systems consistent with right otitis externa. Patient does not have insurance and ciprodex is very expensive, we will try ofloxacin drops daily. Instructed to keep the ear clean and dry apart from the drops. Avoid earbuds and Q-tips. Return precautions discussed. Patient agrees to plan and is discharged in stable condition.  Final Clinical Impressions(s) / UC Diagnoses   Final diagnoses:  Otitis externa of right ear, unspecified chronicity, unspecified type     Discharge Instructions      Please use ear drops as prescribed.  Please return to the urgent care or emergency department if symptoms worsen or do not improve.     ED  Prescriptions     Medication Sig Dispense Auth. Provider   ofloxacin (FLOXIN) 0.3 % OTIC solution Place 3 drops into the right ear daily for 7 days. 1.1 mL Krystyn Picking, Wells Guiles, PA-C      PDMP not reviewed this encounter.   Kern Gingras, Vernice Jefferson 11/09/21 1949

## 2021-12-16 ENCOUNTER — Ambulatory Visit
Admission: EM | Admit: 2021-12-16 | Discharge: 2021-12-16 | Disposition: A | Discharge status: Home / Self Care | Payer: Self-pay | Attending: Family Medicine | Admitting: Family Medicine

## 2021-12-16 DIAGNOSIS — R509 Fever, unspecified: Secondary | ICD-10-CM

## 2021-12-16 DIAGNOSIS — K122 Cellulitis and abscess of mouth: Secondary | ICD-10-CM

## 2021-12-16 MED ORDER — AMOXICILLIN-POT CLAVULANATE 875-125 MG PO TABS: 1.0000 | ORAL_TABLET | Freq: Two times a day (BID) | ORAL | 0 refills | Status: AC

## 2021-12-16 MED ORDER — IBUPROFEN 800 MG PO TABS: 800.0000 mg | ORAL_TABLET | Freq: Three times a day (TID) | ORAL | 0 refills | Status: DC | PRN

## 2021-12-16 MED ORDER — KETOROLAC TROMETHAMINE 30 MG/ML IJ SOLN
30.0000 mg | Freq: Once | INTRAMUSCULAR | Status: AC
  Administered 2021-12-16: 30 mg via INTRAMUSCULAR

## 2021-12-16 NOTE — ED Provider Notes (Signed)
[facility]    CSN: [id number] Arrival date & time: 12/16/21  0837      History   Chief Complaint Chief Complaint  Patient presents with   Generalized Body Aches    HPI Wanda Harris is a 41 y.o. female.   HPI Here for myalgia and chills low-grade temperatures since June 20.  She has not had any cough, sore throat, or nasal congestion.  Also no vomiting or diarrhea.  She has had some swelling and pain in her left jaw.  She states that about 2 or 3 weeks ago she cut her gum line on a short piece of food and then it started hurting on June 20 also.  She is on her period now  Past Medical History:  Diagnosis Date   Asthma    no current med.   Closed fracture of one rib of left side 07/20/2017   left 9th   Finger fracture, right 07/20/2017   ring finger   Migraines    Non-insulin dependent type 2 diabetes mellitus [facility])     Patient Active Problem List   Diagnosis Date Noted   Postpartum care following cesarean delivery 01/12/2016   Vaginal burning 01/12/2016   Constipation 01/12/2016   Gestational diabetes mellitus (GDM), postpartum 01/12/2016   S/P repeat low transverse C-section 11/28/2015   Fetal macrosomia, delivered, current hospitalization 11/28/2015   Pyelonephritis affecting pregnancy in third trimester, antepartum 10/21/2015   Trichomonal vaginitis in pregnancy 08/06/2015   Supervision of young multigravida, antepartum 08/04/2015   GBS (group B streptococcus) UTI complicating pregnancy 08/04/2015   Gestational diabetes mellitus (GDM) affecting pregnancy 08/04/2015   Uterine scar from previous surgery affecting pregnancy 08/04/2015    Past Surgical History:  Procedure Laterality Date   CESAREAN SECTION N/A 07/01/2013   Procedure: Primary Cesarean Section Delivery Baby Boy @ 2038, Apgars 7/9;  Surgeon: Tilda Burrow, MD;  Location: [facility];  Service: Obstetrics;  Laterality: N/A;   CESAREAN SECTION WITH BILATERAL TUBAL LIGATION Bilateral  11/28/2015   Procedure: CESAREAN SECTION WITH BILATERAL TUBAL LIGATION;  Surgeon: Adam Phenix, MD;  Location: [facility];  Service: Obstetrics;  Laterality: Bilateral;   OPEN REDUCTION INTERNAL FIXATION (ORIF) PROXIMAL PHALANX Right 07/23/2017   Procedure: PERCUTANEOUS PINNING OF RIGHT RING FINGER FRACTURE;  Surgeon: Mack Hook, MD;  Location: [facility];  Service: Orthopedics;  Laterality: Right;    OB History     Gravida  6   Para  5   Term  5   Preterm      AB  1   Living  5      SAB  1   IAB  0   Ectopic      Multiple  0   Live Births  5            Home Medications    Prior to Admission medications   Medication Sig Start Date End Date Taking? Authorizing Provider  amoxicillin-clavulanate (AUGMENTIN) 875-125 MG tablet Take 1 tablet by mouth 2 (two) times daily for 7 days. 12/16/21 12/23/21 Yes Zenia Resides, MD  ibuprofen (ADVIL) 800 MG tablet Take 1 tablet (800 mg total) by mouth every 8 (eight) hours as needed (pain). 12/16/21  Yes Zenia Resides, MD  fluconazole (DIFLUCAN) 150 MG tablet Take 1 tablet (150 mg total) by mouth every 3 (three) days. For three doses 05/04/21   Anyanwu, Jethro Bastos, MD  metFORMIN (GLUCOPHAGE) 500 MG tablet Take 1 tablet (500  mg total) by mouth in the morning and at bedtime. 02/06/21 03/08/21  Raspet, Noberto Retort, PA-C    Family History Family History  Problem Relation Age of Onset   Diabetes Mother    Hypertension Mother    Arthritis Mother    Hyperlipidemia Father    Heart disease Father    Stroke Father     Social History Social History   Tobacco Use   Smoking status: Former    Types: Cigarettes    Quit date: 07/26/2007    Years since quitting: 14.4   Smokeless tobacco: Never  Vaping Use   Vaping Use: Never used  Substance Use Topics   Alcohol use: Yes    Comment: occasionally   Drug use: No     Allergies   Patient has no known allergies.   Review of Systems Review of  Systems   Physical Exam Triage Vital Signs ED Triage Vitals  Enc Vitals Group     BP 12/16/21 0853 101/69     Pulse Rate 12/16/21 0853 (!) 101     Resp 12/16/21 0853 16     Temp 12/16/21 0853 99.5 F (37.5 C)     Temp Source 12/16/21 0853 Oral     SpO2 12/16/21 0853 96 %     Weight --      Height --      Head Circumference --      Peak Flow --      Pain Score 12/16/21 0851 5     Pain Loc --      Pain Edu? --      Excl. in [place]? --    No data found.  Updated Vital Signs BP 101/69 (BP Location: Right Arm)   Pulse (!) 101   Temp 99.5 F (37.5 C) (Oral)   Resp 16   LMP 12/11/2021   SpO2 96%   Visual Acuity Right Eye Distance:   Left Eye Distance:   Bilateral Distance:    Right Eye Near:   Left Eye Near:    Bilateral Near:     Physical Exam Vitals reviewed.  Constitutional:      General: She is not in acute distress.    Appearance: She is not ill-appearing, toxic-appearing or diaphoretic.  HENT:     Right Ear: Tympanic membrane and ear canal normal.     Left Ear: Tympanic membrane and ear canal normal.     Nose: Nose normal.     Mouth/Throat:     Mouth: Mucous membranes are moist.     Pharynx: No oropharyngeal exudate or posterior oropharyngeal erythema.     Comments: There is some swelling of her left buccal mucosa.  No fluctuance noted. Eyes:     Extraocular Movements: Extraocular movements intact.     Conjunctiva/sclera: Conjunctivae normal.     Pupils: Pupils are equal, round, and reactive to light.  Cardiovascular:     Rate and Rhythm: Normal rate and regular rhythm.     Heart sounds: No murmur heard. Pulmonary:     Effort: Pulmonary effort is normal. No respiratory distress.     Breath sounds: No stridor. No wheezing, rhonchi or rales.  Musculoskeletal:     Cervical back: Neck supple.  Lymphadenopathy:     Cervical: No cervical adenopathy.  Skin:    Capillary Refill: Capillary refill takes less than 2 seconds.     Coloration: Skin is not  jaundiced or pale.  Neurological:     General: No focal deficit present.  Mental Status: She is alert and oriented to person, place, and time.  Psychiatric:        Behavior: Behavior normal.      UC Treatments / Results  Labs (all labs ordered are listed, but only abnormal results are displayed) Labs Reviewed  COVID-19, FLU A+B NAA    EKG   Radiology No results found.  Procedures Procedures (including critical care time)  Medications Ordered in UC Medications  ketorolac (TORADOL) 30 MG/ML injection 30 mg (30 mg Intramuscular Given 12/16/21 0904)    Initial Impression / Assessment and Plan / UC Course  I have reviewed the triage vital signs and the nursing notes.  Pertinent labs & imaging results that were available during my care of the patient were reviewed by me and considered in my medical decision making (see chart for details).     I am going to treat for possible oral infection, which could be causing her fever and aches, but we are also going to swab for COVID and flu.  We will do that so she knows if she needs to quarantine Final Clinical Impressions(s) / UC Diagnoses   Final diagnoses:  Fever, unspecified  Oral infection     Discharge Instructions      You have been given a shot of Toradol 30 mg today.  Take amoxicillin-clavulanate 875 mg--1 tab twice daily with food for 7 days  Take ibuprofen 800 mg--1 tab every 8 hours as needed for pain.       ED Prescriptions     Medication Sig Dispense Auth. Provider   amoxicillin-clavulanate (AUGMENTIN) 875-125 MG tablet Take 1 tablet by mouth 2 (two) times daily for 7 days. 14 tablet Clarice Bonaventure, Janace Aris, MD   ibuprofen (ADVIL) 800 MG tablet Take 1 tablet (800 mg total) by mouth every 8 (eight) hours as needed (pain). 21 tablet Theoden Mauch, Janace Aris, MD      PDMP not reviewed this encounter.   Zenia Resides, MD 12/16/21 [phone]

## 2021-12-19 LAB — COVID-19, FLU A+B NAA
Influenza A, NAA: NOT DETECTED
Influenza B, NAA: NOT DETECTED
SARS-CoV-2, NAA: NOT DETECTED

## 2022-04-19 ENCOUNTER — Ambulatory Visit: Payer: Self-pay

## 2022-11-27 ENCOUNTER — Ambulatory Visit
Admission: EM | Admit: 2022-11-27 | Discharge: 2022-11-27 | Disposition: A | Discharge status: Home / Self Care | Payer: BC Managed Care – PPO | Attending: Urgent Care | Admitting: Urgent Care

## 2022-11-27 ENCOUNTER — Emergency Department (HOSPITAL_COMMUNITY): Payer: BC Managed Care – PPO

## 2022-11-27 ENCOUNTER — Other Ambulatory Visit: Payer: Self-pay

## 2022-11-27 ENCOUNTER — Emergency Department (HOSPITAL_COMMUNITY)
Admission: EM | Admit: 2022-11-27 | Discharge: 2022-11-28 | Disposition: A | Discharge status: Home / Self Care | Payer: BC Managed Care – PPO | Attending: Emergency Medicine | Admitting: Emergency Medicine

## 2022-11-27 ENCOUNTER — Encounter (HOSPITAL_COMMUNITY): Payer: Self-pay

## 2022-11-27 DIAGNOSIS — R110 Nausea: Secondary | ICD-10-CM | POA: Insufficient documentation

## 2022-11-27 DIAGNOSIS — R109 Unspecified abdominal pain: Secondary | ICD-10-CM

## 2022-11-27 DIAGNOSIS — R1011 Right upper quadrant pain: Secondary | ICD-10-CM | POA: Diagnosis not present

## 2022-11-27 DIAGNOSIS — E1165 Type 2 diabetes mellitus with hyperglycemia: Secondary | ICD-10-CM

## 2022-11-27 DIAGNOSIS — N760 Acute vaginitis: Secondary | ICD-10-CM | POA: Diagnosis not present

## 2022-11-27 DIAGNOSIS — E119 Type 2 diabetes mellitus without complications: Secondary | ICD-10-CM | POA: Insufficient documentation

## 2022-11-27 DIAGNOSIS — R1031 Right lower quadrant pain: Secondary | ICD-10-CM | POA: Diagnosis not present

## 2022-11-27 DIAGNOSIS — B009 Herpesviral infection, unspecified: Secondary | ICD-10-CM | POA: Diagnosis not present

## 2022-11-27 DIAGNOSIS — Z7984 Long term (current) use of oral hypoglycemic drugs: Secondary | ICD-10-CM

## 2022-11-27 DIAGNOSIS — N3000 Acute cystitis without hematuria: Secondary | ICD-10-CM

## 2022-11-27 HISTORY — DX: Herpesviral infection of urogenital system, unspecified: A60.00

## 2022-11-27 LAB — BASIC METABOLIC PANEL
Anion gap: 9 (ref 5–15)
BUN: 12 mg/dL (ref 6–20)
CO2: 22 mmol/L (ref 22–32)
Calcium: 8.7 mg/dL — ABNORMAL LOW (ref 8.9–10.3)
Chloride: 101 mmol/L (ref 98–111)
Creatinine, Ser: 0.5 mg/dL (ref 0.44–1.00)
GFR, Estimated: >60 mL/min (ref >60)
Glucose, Bld: 289 mg/dL — ABNORMAL HIGH (ref 70–99)
Potassium: 3.5 mmol/L (ref 3.5–5.1)
Sodium: 132 mmol/L — ABNORMAL LOW (ref 135–145)

## 2022-11-27 LAB — POCT URINALYSIS DIP (MANUAL ENTRY)
Bilirubin, UA: NEGATIVE
Glucose, UA: >=1000 mg/dL — AB
Ketones, POC UA: NEGATIVE mg/dL
Leukocytes, UA: NEGATIVE
Nitrite, UA: NEGATIVE
Protein Ur, POC: NEGATIVE mg/dL
Spec Grav, UA: 1.01 (ref 1.010–1.025)
Urobilinogen, UA: 0.2 E.U./dL
pH, UA: 6.5 (ref 5.0–8.0)

## 2022-11-27 LAB — CBC
HCT: 32.2 % — ABNORMAL LOW (ref 36.0–46.0)
Hemoglobin: 9.8 g/dL — ABNORMAL LOW (ref 12.0–15.0)
MCH: 21.9 pg — ABNORMAL LOW (ref 26.0–34.0)
MCHC: 30.4 g/dL (ref 30.0–36.0)
MCV: 71.9 fL — ABNORMAL LOW (ref 80.0–100.0)
Platelets: 339 10*3/uL (ref 150–400)
RBC: 4.48 MIL/uL (ref 3.87–5.11)
RDW: 16.2 % — ABNORMAL HIGH (ref 11.5–15.5)
WBC: 7.1 10*3/uL (ref 4.0–10.5)
nRBC: 0 % (ref 0.0–0.2)

## 2022-11-27 LAB — URINALYSIS, ROUTINE W REFLEX MICROSCOPIC
Bilirubin Urine: NEGATIVE
Glucose, UA: >=500 mg/dL — AB
Hgb urine dipstick: NEGATIVE
Ketones, ur: NEGATIVE mg/dL
Nitrite: NEGATIVE
Protein, ur: NEGATIVE mg/dL
Specific Gravity, Urine: 1.033 — ABNORMAL HIGH (ref 1.005–1.030)
pH: 6 (ref 5.0–8.0)

## 2022-11-27 LAB — I-STAT BETA HCG BLOOD, ED (MC, WL, AP ONLY): I-stat hCG, quantitative: <5 m[IU]/mL (ref <5)

## 2022-11-27 LAB — POCT FASTING CBG KUC MANUAL ENTRY: POCT Glucose (KUC): 323 mg/dL — AB (ref 70–99)

## 2022-11-27 LAB — POCT URINE PREGNANCY: Preg Test, Ur: NEGATIVE

## 2022-11-27 MED ORDER — ONDANSETRON 4 MG PO TBDP: 4.0000 mg | ORAL_TABLET | Freq: Once | ORAL | Status: DC

## 2022-11-27 MED ORDER — ONDANSETRON HCL 4 MG/2ML IJ SOLN
4.0000 mg | Freq: Once | INTRAMUSCULAR | Status: AC
  Administered 2022-11-27: 4 mg via INTRAVENOUS
  Filled 2022-11-27: qty 2

## 2022-11-27 MED ORDER — MORPHINE SULFATE (PF) 4 MG/ML IV SOLN
4.0000 mg | Freq: Once | INTRAVENOUS | Status: AC
  Administered 2022-11-27: 4 mg via INTRAVENOUS
  Filled 2022-11-27: qty 1

## 2022-11-27 MED ORDER — VALACYCLOVIR HCL 1 G PO TABS: ORAL_TABLET | ORAL | 0 refills | Status: AC

## 2022-11-27 MED ORDER — FLUCONAZOLE 150 MG PO TABS: 150.0000 mg | ORAL_TABLET | ORAL | 0 refills | Status: AC

## 2022-11-27 MED ORDER — METFORMIN HCL 500 MG PO TABS: 500.0000 mg | ORAL_TABLET | Freq: Two times a day (BID) | ORAL | 2 refills | Status: DC

## 2022-11-27 NOTE — ED Provider Notes (Signed)
[facility] EMERGENCY DEPARTMENT AT [facility] Provider Note   CSN: [id number] Arrival date & time: 11/27/22  1956     History {Add pertinent medical, surgical, social history, OB history to HPI:1} Chief Complaint  Patient presents with   Flank Pain    Wanda Harris is a 42 y.o. female with history of DM presents to the ED with abdominal and flank/back pain for 7 days. Patient describes her pain as "sharp and constant" and rates it 9/10. Patient states her pain is in RUQ/RLQ and radiates to her right flank/back area. Patient states laying on her right side helps alleviate her pain. Patient has been taking ibuprofen with minimal relief. Patient endorses nausea, chills, decreased appetite, and dysuria. Patient denies fever, chest pain, SOB, hematuria, urinary incontinence. Patient states she went to urgent care today because she has blisters to her genitalia and she was prescribed treatment for herpes simplex. Patient reports her urgent care provider advised pt to go to ED today due to her severe abdominal pain for CT to rule out surgical emergency. Patient denies history of abdominal surgery besides 2x C-section, last one in 2017. No vaginal bleeding or discharge. No concern for STDs.     Home Medications Valtrex Fluconazole Out of Metformin x 1 month  Allergies    Patient has no known allergies.    Review of Systems   Review of Systems  All other systems reviewed and are negative.   Physical Exam Updated Vital Signs BP 114/69 (BP Location: Left Arm)   Pulse 75   Temp 98.5 F (36.9 C) (Oral)   Resp 20   LMP 11/13/2022   SpO2 100%  Physical Exam Vitals and nursing note reviewed.  Constitutional:      General: She is not in acute distress.    Appearance: Normal appearance. She is not ill-appearing or toxic-appearing.  HENT:     Head: Normocephalic and atraumatic.     Mouth/Throat:     Mouth: Mucous membranes are moist.  Eyes:     Conjunctiva/sclera:  Conjunctivae normal.  Cardiovascular:     Rate and Rhythm: Normal rate and regular rhythm.     Heart sounds: No murmur heard. Pulmonary:     Effort: Pulmonary effort is normal.     Breath sounds: Normal breath sounds.  Abdominal:     General: Abdomen is flat. There is no distension.     Palpations: Abdomen is soft. There is no pulsatile mass.     Tenderness: There is abdominal tenderness (mild RLQ, minimal RUQ and R CVA). There is no left CVA tenderness, guarding or rebound. Negative signs include Murphy's sign and Rovsing's sign.  Musculoskeletal:        General: Normal range of motion.     Cervical back: Neck supple.     Right lower leg: No edema.     Left lower leg: No edema.  Skin:    General: Skin is warm and dry.     Capillary Refill: Capillary refill takes less than 2 seconds.     Coloration: Skin is not jaundiced or pale.  Neurological:     Mental Status: She is alert. Mental status is at baseline.  Psychiatric:        Behavior: Behavior normal.     ED Results / Procedures / Treatments   Labs (all labs ordered are listed, but only abnormal results are displayed) Labs Reviewed  BASIC METABOLIC PANEL - Abnormal; Notable for the following components:  Result Value   Sodium 132 (*)    Glucose, Bld 289 (*)    Calcium 8.7 (*)    All other components within normal limits  CBC - Abnormal; Notable for the following components:   Hemoglobin 9.8 (*)    HCT 32.2 (*)    MCV 71.9 (*)    MCH 21.9 (*)    RDW 16.2 (*)    All other components within normal limits  HEPATIC FUNCTION PANEL  LIPASE, BLOOD  URINALYSIS, ROUTINE W REFLEX MICROSCOPIC  I-STAT BETA HCG BLOOD, ED ([facility], [facility], [facility] ONLY)    EKG None  Radiology No results found.  Procedures Procedures  {Document cardiac monitor, telemetry assessment procedure when appropriate:1}  Medications Ordered in ED Medications  ondansetron (ZOFRAN) injection 4 mg (has no administration in time range)  morphine (PF) 4  MG/ML injection 4 mg (has no administration in time range)    ED Course/ Medical Decision Making/ A&P   {   Click here for ABCD2, HEART and other calculatorsREFRESH Note before signing :1}                          Medical Decision Making Amount and/or Complexity of Data Reviewed Labs: ordered. Decision-making details documented in ED Course. Radiology: ordered. Decision-making details documented in ED Course.   Medical Decision Making:   Wanda Harris is a 42 y.o. female who presented to the ED today with abdominal pain detailed above.    Patient's presentation is complicated by their history of c-section, DM currently untreated.  Complete initial physical exam performed, notably the patient  was in NAD. Mild RLQ ttp, minimal RUQ and R CVA. Abdomen non-distended. No peritoneal signs. Pt well appearing.    Reviewed and confirmed nursing documentation for past medical history, family history, social history.    Initial Assessment:   With the patient's presentation of abdominal pain, differential diagnosis includes but is not limited to AAA, mesenteric ischemia, appendicitis, diverticulitis, DKA, gastritis, gastroenteritis, AMI, nephrolithiasis, pancreatitis, peritonitis, adrenal insufficiency, intestinal ischemia, constipation, UTI, SBO/LBO, splenic rupture, biliary disease, IBD, IBS, PUD, hepatitis, STD, ovarian/testicular torsion, electrolyte disturbance, DKA, dehydration, acute kidney injury, renal failure, cholecystitis, cholelithiasis, choledocholithiasis, abdominal pain of  unknown etiology, pregnancy, incomplete abortion, septic abortion, threatened abortion, ectopic pregnancy, PID.   Initial Plan:  Screening labs including CBC and Metabolic panel to evaluate for infectious or metabolic etiology of disease.  Lipase to evaluate for pancreatitis Urinalysis with reflex culture ordered to evaluate for UTI or relevant urologic/nephrologic pathology.  CT abd/pelvis to evaluate for  intra-abdominal pathology Symptomatic management Objective evaluation as reviewed   Initial Study Results:   Laboratory  All laboratory results reviewed without evidence of clinically relevant pathology.   Exceptions include: ***   Radiology:  All images reviewed independently. Agree with radiology report at this time.   No results found.    Consults: Case discussed with ***.   Final Assessment and Plan:   ***    Clinical Impression:  1. Right flank pain   2. Right lower quadrant pain      Data Unavailable    {Document critical care time when appropriate:1} {Document review of labs and clinical decision tools ie heart score, Chads2Vasc2 etc:1}  {Document your independent review of radiology images, and any outside records:1} {Document your discussion with family members, caretakers, and with consultants:1} {Document social determinants of health affecting pt's care:1} {Document your decision making why or why not admission, treatments were needed:1}  Final Clinical Impression(s) / ED Diagnoses Final diagnoses:  Right flank pain  Right lower quadrant pain    Rx / DC Orders ED Discharge Orders          Ordered    metFORMIN (GLUCOPHAGE) 500 MG tablet  2 times daily        11/27/22 2308

## 2022-11-27 NOTE — ED Triage Notes (Signed)
Pt arrives c/o R sided flank pain x1 week as well as painful blisters to vagina. Also states dysuria. States that she went to UC and was diagnosed with HSV, acute vaginitis and possible yeast infection. UC provider told pt to come to ED for further evaluation.

## 2022-11-27 NOTE — ED Provider Notes (Signed)
[facility] - URGENT CARE CENTER  Note:  This document was prepared using Conservation officer, historic buildings and may include unintentional dictation errors.  MRN: [id number] DOB: 05/08/1981  Subjective:   Wanda Harris is a 42 y.o. female presenting for 7-day history of right-sided abdominal pain, flank pain.  She is also had painful blisterlike lesions with itching along the left side of her vagina.  Has a history of genital herpes, was confirmed last year.  She also has type 2 diabetes treated without insulin.  No urinary symptoms.  No history of renal stones.  No history of gallbladder disease.  No current facility-administered medications for this encounter.  Current Outpatient Medications:    fluconazole (DIFLUCAN) 150 MG tablet, Take 1 tablet (150 mg total) by mouth every 3 (three) days. For three doses, Disp: 3 tablet, Rfl: 3   ibuprofen (ADVIL) 800 MG tablet, Take 1 tablet (800 mg total) by mouth every 8 (eight) hours as needed (pain)., Disp: 21 tablet, Rfl: 0   metFORMIN (GLUCOPHAGE) 500 MG tablet, Take 1 tablet (500 mg total) by mouth in the morning and at bedtime., Disp: 60 tablet, Rfl: 0   No Known Allergies  Past Medical History:  Diagnosis Date   Asthma    no current med.   Closed fracture of one rib of left side 07/20/2017   left 9th   Finger fracture, right 07/20/2017   ring finger   Genital herpes    per pt   Migraines    Non-insulin dependent type 2 diabetes mellitus [facility])      Past Surgical History:  Procedure Laterality Date   CESAREAN SECTION N/A 07/01/2013   Procedure: Primary Cesarean Section Delivery Baby Boy @ 2038, Apgars 7/9;  Surgeon: Tilda Burrow, MD;  Location: [facility];  Service: Obstetrics;  Laterality: N/A;   CESAREAN SECTION WITH BILATERAL TUBAL LIGATION Bilateral 11/28/2015   Procedure: CESAREAN SECTION WITH BILATERAL TUBAL LIGATION;  Surgeon: Adam Phenix, MD;  Location: [facility];  Service: Obstetrics;  Laterality:  Bilateral;   OPEN REDUCTION INTERNAL FIXATION (ORIF) PROXIMAL PHALANX Right 07/23/2017   Procedure: PERCUTANEOUS PINNING OF RIGHT RING FINGER FRACTURE;  Surgeon: Mack Hook, MD;  Location: [facility];  Service: Orthopedics;  Laterality: Right;    Family History  Problem Relation Age of Onset   Diabetes Mother    Hypertension Mother    Arthritis Mother    Hyperlipidemia Father    Heart disease Father    Stroke Father     Social History   Tobacco Use   Smoking status: Former    Types: Cigarettes    Quit date: 07/26/2007    Years since quitting: 15.3   Smokeless tobacco: Never  Vaping Use   Vaping Use: Every day  Substance Use Topics   Alcohol use: Yes    Comment: occasionally   Drug use: No    ROS   Objective:   Vitals: BP 113/71 (BP Location: Right Arm)   Pulse 84   Temp 98 F (36.7 C) (Oral)   Resp 20   LMP 11/13/2022   SpO2 98%   Physical Exam Constitutional:      General: She is not in acute distress.    Appearance: Normal appearance. She is well-developed. She is not ill-appearing, toxic-appearing or diaphoretic.  HENT:     Head: Normocephalic and atraumatic.     Nose: Nose normal.     Mouth/Throat:     Mouth: Mucous membranes are moist.  Eyes:     General: No scleral icterus.       Right eye: No discharge.        Left eye: No discharge.     Extraocular Movements: Extraocular movements intact.     Conjunctiva/sclera: Conjunctivae normal.  Cardiovascular:     Rate and Rhythm: Normal rate.  Pulmonary:     Effort: Pulmonary effort is normal.  Abdominal:     General: Bowel sounds are normal. There is no distension.     Palpations: Abdomen is soft. There is no mass.     Tenderness: There is abdominal tenderness (right flank side) in the right upper quadrant. There is guarding. There is no right CVA tenderness, left CVA tenderness or rebound. Positive signs include Murphy's sign.  Genitourinary:   Skin:    General: Skin is warm  and dry.  Neurological:     General: No focal deficit present.     Mental Status: She is alert and oriented to person, place, and time.  Psychiatric:        Mood and Affect: Mood normal.        Behavior: Behavior normal.        Thought Content: Thought content normal.        Judgment: Judgment normal.     Results for orders placed or performed during the hospital encounter of 11/27/22 (from the past 24 hour(s))  POCT urine pregnancy     Status: None   Collection Time: 11/27/22  6:51 PM  Result Value Ref Range   Preg Test, Ur Negative Negative  POCT urinalysis dipstick     Status: Abnormal   Collection Time: 11/27/22  6:51 PM  Result Value Ref Range   Color, UA yellow yellow   Clarity, UA clear clear   Glucose, UA >=1,000 (A) negative mg/dL   Bilirubin, UA negative negative   Ketones, POC UA negative negative mg/dL   Spec Grav, UA 1.191 4.782 - 1.025   Blood, UA trace-intact (A) negative   pH, UA 6.5 5.0 - 8.0   Protein Ur, POC negative negative mg/dL   Urobilinogen, UA 0.2 0.2 or 1.0 E.U./dL   Nitrite, UA Negative Negative   Leukocytes, UA Negative Negative  POCT CBG (manual entry)     Status: Abnormal   Collection Time: 11/27/22  7:29 PM  Result Value Ref Range   POCT Glucose ([facility]) 323 (A) 70 - 99 mg/dL    Assessment and Plan :   PDMP not reviewed this encounter.  1. Acute vaginitis   2. Herpes simplex infection   3. Type 2 diabetes mellitus treated without insulin (HCC)   4. Vulvovaginitis    Start fluconazole to address a yeast vaginitis likely secondary to her uncontrolled diabetes.  I suspect this is also flaring up her herpes simplex infection.  Recommended valacyclovir for this.  Outside of that, patient is in need of higher level of evaluation and care than we can provide in the urgent care setting.  This includes workup for ruling out an acute abdomen, acute cholecystitis, an emergent gallbladder issue.  She does not have a PCP and therefore cannot rely on  outpatient imaging.  Discussed this with patient and she will present to the emergency room now.   Wallis Bamberg, [place] 11/27/22 [id number]

## 2022-11-27 NOTE — ED Notes (Signed)
Results for iSTAT beta hCG: Negative  Pt's RN and EDP notified via secure chat.

## 2022-11-27 NOTE — ED Notes (Signed)
upper extremitiesPatient is being discharged from the Urgent Care and sent to the Emergency Department via POV . Per Urban Gibson, PA-C, patient is in need of higher level of care due to abd pain. Patient is aware and verbalizes understanding of plan of care.  Vitals:   11/27/22 1841  BP: 113/71  Pulse: 84  Resp: 20  Temp: 98 F (36.7 C)  SpO2: 98%

## 2022-11-27 NOTE — Discharge Instructions (Addendum)
Please report to the ER now as you are in need of a higher level of testing than we can provide in the urgent setting. This includes testing for gall bladder emergencies like cholecystitis.

## 2022-11-27 NOTE — ED Triage Notes (Addendum)
Pt c/o right side abd pain and right flank pain x 7 days-noticed painful/itching blisters to vaginal area x 3-days-NAD-slow gait-pt later added that she was dx with genital herpes/not taking any meds

## 2022-11-27 NOTE — ED Notes (Signed)
In with Mani, PA-C for exam 

## 2022-11-28 LAB — WET PREP, GENITAL
Clue Cells Wet Prep HPF POC: NONE SEEN
Sperm: NONE SEEN
Trich, Wet Prep: NONE SEEN
WBC, Wet Prep HPF POC: >=10 — AB (ref <10)
Yeast Wet Prep HPF POC: NONE SEEN

## 2022-11-28 LAB — LIPASE, BLOOD: Lipase: 33 U/L (ref 11–51)

## 2022-11-28 LAB — GC/CHLAMYDIA PROBE AMP (~~LOC~~) NOT AT ARMC
Chlamydia: NEGATIVE
Comment: NEGATIVE
Comment: NORMAL
Neisseria Gonorrhea: NEGATIVE

## 2022-11-28 LAB — HEPATIC FUNCTION PANEL
ALT: 23 U/L (ref 0–44)
AST: 17 U/L (ref 15–41)
Albumin: 3.6 g/dL (ref 3.5–5.0)
Alkaline Phosphatase: 94 U/L (ref 38–126)
Bilirubin, Direct: <0.1 mg/dL (ref 0.0–0.2)
Total Bilirubin: 0.2 mg/dL — ABNORMAL LOW (ref 0.3–1.2)
Total Protein: 7.1 g/dL (ref 6.5–8.1)

## 2022-11-28 MED ORDER — IOHEXOL 300 MG/ML  SOLN
100.0000 mL | Freq: Once | INTRAMUSCULAR | Status: AC | PRN
  Administered 2022-11-28: 100 mL via INTRAVENOUS

## 2022-11-28 MED ORDER — CEFPODOXIME PROXETIL 200 MG PO TABS: 200.0000 mg | ORAL_TABLET | Freq: Two times a day (BID) | ORAL | 0 refills | Status: AC

## 2022-11-28 NOTE — ED Provider Notes (Signed)
Physical Exam  BP 118/74   Pulse 72   Temp 98.5 F (36.9 C) (Oral)   Resp 18   LMP 11/13/2022   SpO2 99%   Physical Exam Vitals and nursing note reviewed. Exam conducted with a chaperone present Morrell Riddle, RN).  Constitutional:      Appearance: Normal appearance.  Eyes:     General: No scleral icterus. Pulmonary:     Effort: Pulmonary effort is normal. No respiratory distress.  Abdominal:     Palpations: Abdomen is soft.     Tenderness: There is abdominal tenderness. There is no guarding or rebound.     Comments: Some right-sided abdominal tenderness palpation.  No overlying skin changes noted.  Soft.  No guarding or rebound noted.  Genitourinary:    Comments: Some lesions noted to the bilateral labia minora and majora more on the left than the right.  Consistent with herpes.  She does have a moderate amount of thin white discharge noted.  No CMT tenderness. Skin:    General: Skin is dry.     Findings: No rash.  Neurological:     General: No focal deficit present.     Mental Status: She is alert. Mental status is at baseline.  Psychiatric:        Mood and Affect: Mood normal.     Procedures  Procedures  ED Course / MDM    Medical Decision Making Amount and/or Complexity of Data Reviewed Labs: ordered. Radiology: ordered.  Risk Prescription drug management.   Accepted handoff at shift change from [facility], PA-C. Please see prior provider note for more detail.   Briefly: Patient is 42 y.o. F presenting for evaluation of right sided abdominal pain. She was sent in from [facility].   DDX: concern for intra- abdominal etiology  Plan: follow on labs and imaging  I independently reviewed and interpreted the patient's labs.  Lipase within normal limits.  hCG negative.  BMP does show mild decrease in sodium 132.  Elevated glucose at 289 although patient's not taking her diabetes medications.  Mildly decreased calcium at 8.7.  Hepatic function panel shows mildly decreased  total bili at 0.2 otherwise no LFT abnormality.  CBC does show anemia at 9.8.  Appears the patient does fluctuate with her anemia although does appear close to her baseline.  Urinalysis shows concentrated urine with glucose present.  There is small amount of leukocytes with rare bacteria and white blood cells present.  Given the patient's dysuria as well as the budding yeast, will start patient on antibiotic and give her Diflucan as well.  Urine culture pending.  Wet prep shows some leukorrhea, otherwise unremarkable.  Patient does not have any CMT tenderness, doubt any PID.Marland Kitchen  CT imaging shows . No acute process demonstrated in the abdomen or pelvis. No evidence of bowel obstruction or inflammation. 2. Fatty infiltration of the liver. 3. 1.6 cm diameter left ovarian cysts likely representing involuting cyst. No follow-up imaging is recommended.  From looking at the CT imaging, does appear the patient has a moderate stool burden as well, especially seen more on the right side.  Could be what is causing this patient's pain. Her urine is also concerning for a UTI.  CT does not show any signs of appendicitis.  Biliary tract appears unremarkable as well.  I do not appreciate any CVA tenderness upon palpation/percussion.  She has no leukocytosis and her vital signs are stable.  Will discharge home on antibiotics for UTI as well as recommend a  bowel prep.  She was already given valacyclovir by urgent care for her herpes outbreak. UC also sent in diflucan for her yeast infection. Previously Clinical research associate sent her and her metformin for her medication. She is stable for discharge home.   We discussed the results of the labs/imaging. The plan is Take antibiotic for UTI, miralax to help with constipation, follow up with PCP. We discussed strict return precautions and red flag symptoms. The patient verbalized their understanding and agrees to the plan. The patient is stable and being discharged home in good condition.  Portions  of this report may have been transcribed using voice recognition software. Every effort was made to ensure accuracy; however, inadvertent computerized transcription errors may be present.    Results for orders placed or performed during the hospital encounter of 11/27/22  Basic metabolic panel  Result Value Ref Range   Sodium 132 (L) 135 - 145 mmol/L   Potassium 3.5 3.5 - 5.1 mmol/L   Chloride 101 98 - 111 mmol/L   CO2 22 22 - 32 mmol/L   Glucose, Bld 289 (H) 70 - 99 mg/dL   BUN 12 6 - 20 mg/dL   Creatinine, Ser 1.61 0.44 - 1.00 mg/dL   Calcium 8.7 (L) 8.9 - 10.3 mg/dL   GFR, Estimated >09 >60 mL/min   Anion gap 9 5 - 15  CBC  Result Value Ref Range   WBC 7.1 4.0 - 10.5 K/uL   RBC 4.48 3.87 - 5.11 MIL/uL   Hemoglobin 9.8 (L) 12.0 - 15.0 g/dL   HCT 45.4 (L) 09.8 - 11.9 %   MCV 71.9 (L) 80.0 - 100.0 fL   MCH 21.9 (L) 26.0 - 34.0 pg   MCHC 30.4 30.0 - 36.0 g/dL   RDW 14.7 (H) 82.9 - 56.2 %   Platelets 339 150 - 400 K/uL   nRBC 0.0 0.0 - 0.2 %  Hepatic function panel  Result Value Ref Range   Total Protein 7.1 6.5 - 8.1 g/dL   Albumin 3.6 3.5 - 5.0 g/dL   AST 17 15 - 41 U/L   ALT 23 0 - 44 U/L   Alkaline Phosphatase 94 38 - 126 U/L   Total Bilirubin 0.2 (L) 0.3 - 1.2 mg/dL   Bilirubin, Direct <1.3 0.0 - 0.2 mg/dL   Indirect Bilirubin NOT CALCULATED 0.3 - 0.9 mg/dL  Lipase, blood  Result Value Ref Range   Lipase 33 11 - 51 U/L  Urinalysis, Routine w reflex microscopic -Urine, Clean Catch  Result Value Ref Range   Color, Urine YELLOW YELLOW   APPearance CLEAR CLEAR   Specific Gravity, Urine 1.033 (H) 1.005 - 1.030   pH 6.0 5.0 - 8.0   Glucose, UA >=500 (A) NEGATIVE mg/dL   Hgb urine dipstick NEGATIVE NEGATIVE   Bilirubin Urine NEGATIVE NEGATIVE   Ketones, ur NEGATIVE NEGATIVE mg/dL   Protein, ur NEGATIVE NEGATIVE mg/dL   Nitrite NEGATIVE NEGATIVE   Leukocytes,Ua SMALL (A) NEGATIVE   RBC / HPF 0-5 0 - 5 RBC/hpf   WBC, UA 6-10 0 - 5 WBC/hpf   Bacteria, UA RARE (A) NONE  SEEN   Squamous Epithelial / HPF 0-5 0 - 5 /HPF   Mucus PRESENT    Budding Yeast PRESENT   I-Stat beta hCG blood, ED  Result Value Ref Range   I-stat hCG, quantitative <5.0 <5 mIU/mL   Comment 3           CT ABDOMEN PELVIS W CONTRAST  Result Date: 11/28/2022  CLINICAL DATA:  Right lower quadrant abdominal pain for 7 days. EXAM: CT ABDOMEN AND PELVIS WITH CONTRAST TECHNIQUE: Multidetector CT imaging of the abdomen and pelvis was performed using the standard protocol following bolus administration of intravenous contrast. RADIATION DOSE REDUCTION: This exam was performed according to the departmental dose-optimization program which includes automated exposure control, adjustment of the mA and/or kV according to patient size and/or use of iterative reconstruction technique. CONTRAST:  OMNIPAQUE IOHEXOL 300 MG/ML  SOLN COMPARISON:  MRI pelvis 05/25/2021. CT abdomen and pelvis 02/03/2021 FINDINGS: Lower chest: Lung bases are clear. Hepatobiliary: Mild fatty infiltration of the liver. No focal lesions. Gallbladder and bile ducts are unremarkable. Pancreas: Unremarkable. No pancreatic ductal dilatation or surrounding inflammatory changes. Spleen: Normal in size without focal abnormality. Adrenals/Urinary Tract: Adrenal glands are unremarkable. Kidneys are normal, without renal calculi, focal lesion, or hydronephrosis. Bladder is unremarkable. Stomach/Bowel: Stomach is within normal limits. Appendix appears normal. No evidence of bowel wall thickening, distention, or inflammatory changes. Vascular/Lymphatic: No significant vascular findings are present. No enlarged abdominal or pelvic lymph nodes. Reproductive: Uterus and ovaries are not enlarged. Small cyst on the left ovary measuring 1.6 cm diameter, likely physiologic. Soft tissue nodule previously demonstrated in the right pelvis is no longer visualized. No significant lymphadenopathy. Surgical clips in the pelvis likely related to prior tubal ligations.  Other: No free air or free fluid in the abdomen. Minimal periumbilical hernia containing fat. Musculoskeletal: No acute or significant osseous findings. IMPRESSION: 1. No acute process demonstrated in the abdomen or pelvis. No evidence of bowel obstruction or inflammation. 2. Fatty infiltration of the liver. 3. 1.6 cm diameter left ovarian cysts likely representing involuting cyst. No follow-up imaging is recommended. Reference: JACR 2020 Feb;17(2):248-254 Electronically Signed   By: Burman Nieves M.D.   On: 11/28/2022 00:49       Achille Rich, PA-C 11/28/22 1610    Shon Baton, MD 11/28/22 [phone]

## 2022-11-28 NOTE — Discharge Instructions (Signed)
You were seen in the ER today for evaluation of your abdominal pain.  I think this is likely from you being constipated and having a urinary tract infection.  I recommend taking a scoop of MiraLAX in the morning every morning until you are stool is a consistency of peanut butter.  For your urinary tract infection, sending home and an antibiotic that you will take twice daily for the next 7 days.  Please make sure you complete the entirety of the course and take as directed.  I would like for you to follow-up with your primary care doctor for additional refills of your metformin.  Please take the medications you were given in urgent care as prescribed as well.  If you have any concerns, new or worsening symptoms, please return to the nearest emergency department for evaluation.  Contact a doctor if: Your belly pain changes or gets worse. You have very bad cramping or bloating in your belly. You vomit. Your pain gets worse with meals, after eating, or with certain foods. You have trouble pooping or have watery poop for more than 2-3 days. You are not hungry, or you lose weight without trying. You have signs of not getting enough fluid or water (dehydration). These may include: Dark pee, very little pee, or no pee. Cracked lips or dry mouth. Feeling sleepy or weak. You have pain when you pee or poop. Your belly pain wakes you up at night. You have blood in your pee. You have a fever. Get help right away if: You cannot stop vomiting. Your pain is only in one part of your belly, like on the right side. You have bloody or black poop, or poop that looks like tar. You have trouble breathing. You have chest pain. These symptoms may be an emergency. Get help right away. Call 911. Do not wait to see if the symptoms will go away. Do not drive yourself to the hospital.

## 2022-11-30 LAB — URINE CULTURE: Culture: >=100000 — AB

## 2022-12-01 ENCOUNTER — Telehealth (HOSPITAL_BASED_OUTPATIENT_CLINIC_OR_DEPARTMENT_OTHER): Payer: Self-pay | Admitting: *Deleted

## 2022-12-01 NOTE — Telephone Encounter (Signed)
Post ED Visit - Positive Culture Follow-up  Culture report reviewed by antimicrobial stewardship pharmacist: [facility] []  Enzo Bi, Pharm.D. []  Celedonio Miyamoto, Pharm.D., BCPS AQ-ID []  Garvin Fila, Pharm.D., BCPS []  Georgina Pillion, Pharm.D., BCPS []  [place], [address].D., BCPS, AAHIVP []  Estella Husk, Pharm.D., BCPS, AAHIVP []  Lysle Pearl, PharmD, BCPS []  Phillips Climes, PharmD, BCPS []  Agapito Games, PharmD, BCPS []  Verlan Friends, PharmD []  Mervyn Gay, PharmD, BCPS []  Vinnie Level, PharmD  [facility] []  Len Childs, PharmD []  Greer Pickerel, PharmD []  Adalberto Cole, PharmD []  Perlie Gold, Rph []  Lonell Face) Jean Rosenthal, PharmD []  Earl Many, PharmD []  Junita Push, PharmD []  Dorna Leitz, PharmD []  Terrilee Files, PharmD []  Lynann Beaver, PharmD []  Keturah Barre, PharmD []  Loralee Pacas, PharmD [x]  Amy Earl Lites PharmD   Positive urine culture Treated with Cefpodoxime Proxetil, organism sensitive to the same and no further patient follow-up is required at this time.  [name] 12/01/2022, 1:41 PM

## 2024-03-07 ENCOUNTER — Ambulatory Visit
Admission: EM | Admit: 2024-03-07 | Discharge: 2024-03-07 | Disposition: A | Discharge status: Home / Self Care | Payer: Self-pay

## 2024-03-07 DIAGNOSIS — U071 COVID-19: Secondary | ICD-10-CM

## 2024-03-07 LAB — POC COVID19/FLU A&B COMBO
Covid Antigen, POC: POSITIVE — AB
Influenza A Antigen, POC: NEGATIVE
Influenza B Antigen, POC: NEGATIVE

## 2024-03-07 MED ORDER — AZELASTINE HCL 0.1 % NA SOLN
1.0000 | Freq: Two times a day (BID) | NASAL | 1 refills | Status: AC
Start: 2024-03-07 — End: ?

## 2024-03-07 MED ORDER — IBUPROFEN 800 MG PO TABS
800.0000 mg | ORAL_TABLET | Freq: Three times a day (TID) | ORAL | 0 refills | Status: AC | PRN
Start: 2024-03-07 — End: ?

## 2024-03-07 MED ORDER — PROMETHAZINE-DM 6.25-15 MG/5ML PO SYRP
5.0000 mL | ORAL_SOLUTION | Freq: Four times a day (QID) | ORAL | 0 refills | Status: AC | PRN
Start: 2024-03-07 — End: ?

## 2024-03-07 NOTE — ED Triage Notes (Signed)
 Patient reports starting Thursday night with a ha (all day at work) with sore throat, headache, then once home chills, Cough, Tylenol  (Cold/Flu) taken and continued, these symptoms remain with possible Fever. No seasonal Flu or COVID19 vaccine.

## 2024-03-07 NOTE — ED Provider Notes (Signed)
 [facility]  Note:  This document was prepared using Conservation officer, historic buildings and may include unintentional dictation errors.  MRN: [id number] DOB: 1980/12/25  Subjective:   Wanda Harris is a 43 y.o. female presenting for headache, sore throat, chills, cough since Thursday night.  Patient reports that she has taken Tylenol  Cold and flu with minimal improvement.  Patient denies any recent COVID-vaccine or seasonal flu vaccine.  Denies any known sick contacts.  No shortness of breath, chest pain, weakness, dizziness.  No current facility-administered medications for this encounter.  Current Outpatient Medications:    azelastine  (ASTELIN ) 0.1 % nasal spray, Place 1 spray into both nostrils 2 (two) times daily. Use in each nostril as directed, Disp: 30 mL, Rfl: 1   ciprofloxacin (CIPRO) 500 MG tablet, Take 500 mg by mouth 2 (two) times daily., Disp: , Rfl:    lovastatin (MEVACOR) 10 MG tablet, Take 10 mg by mouth at bedtime., Disp: , Rfl:    promethazine -dextromethorphan (PROMETHAZINE -DM) 6.25-15 MG/5ML syrup, Take 5 mLs by mouth 4 (four) times daily as needed., Disp: 240 mL, Rfl: 0   Pseudoephedrine-APAP-DM (TYLENOL  COLD/FLU DAY PO), Take by mouth., Disp: , Rfl:    SUMAtriptan (IMITREX) 25 MG tablet, Take 25 mg by mouth every 2 (two) hours as needed., Disp: , Rfl:    terconazole  (TERAZOL 3 ) 0.8 % vaginal cream, Place 1 applicator vaginally at bedtime., Disp: , Rfl:    triamcinolone  cream (KENALOG ) 0.1 %, Apply 1 Application topically 2 (two) times daily., Disp: , Rfl:    cephALEXin  (KEFLEX ) 500 MG capsule, Take 500 mg by mouth 4 (four) times daily., Disp: , Rfl:    fluconazole  (DIFLUCAN ) 150 MG tablet, Take 1 tablet (150 mg total) by mouth every 3 (three) days. For three doses, Disp: 5 tablet, Rfl: 0   ibuprofen  (ADVIL ) 800 MG tablet, Take 1 tablet (800 mg total) by mouth every 8 (eight) hours as needed for fever or headache (pain)., Disp: 21 tablet, Rfl: 0    metFORMIN  (GLUCOPHAGE ) 500 MG tablet, Take 1 tablet (500 mg total) by mouth in the morning and at bedtime., Disp: 60 tablet, Rfl: 2   ondansetron  (ZOFRAN -ODT) 4 MG disintegrating tablet, Take 4 mg by mouth every 8 (eight) hours as needed., Disp: , Rfl:    valACYclovir  (VALTREX ) 1000 MG tablet, At the start of an outbreak take 1 tablet daily for 5 days., Disp: 30 tablet, Rfl: 0   No Known Allergies  Past Medical History:  Diagnosis Date   Asthma    no current med.   Closed fracture of one rib of left side 07/20/2017   left 9th   Finger fracture, right 07/20/2017   ring finger   Genital herpes    per pt   Migraines    Non-insulin  dependent type 2 diabetes mellitus [facility])      Past Surgical History:  Procedure Laterality Date   CESAREAN SECTION N/A 07/01/2013   Procedure: Primary Cesarean Section Delivery Baby Boy @ 2038, Apgars 7/9;  Surgeon: Norleen LULLA Server, MD;  Location: [facility];  Service: Obstetrics;  Laterality: N/A;   CESAREAN SECTION WITH BILATERAL TUBAL LIGATION Bilateral 11/28/2015   Procedure: CESAREAN SECTION WITH BILATERAL TUBAL LIGATION;  Surgeon: Lynwood KANDICE Solomons, MD;  Location: [facility];  Service: Obstetrics;  Laterality: Bilateral;   OPEN REDUCTION INTERNAL FIXATION (ORIF) PROXIMAL PHALANX Right 07/23/2017   Procedure: PERCUTANEOUS PINNING OF RIGHT RING FINGER FRACTURE;  Surgeon: Sebastian Lenis, MD;  Location: [facility];  Service: Orthopedics;  Laterality: Right;    Family History  Problem Relation Age of Onset   Diabetes Mother    Hypertension Mother    Arthritis Mother    Hyperlipidemia Father    Heart disease Father    Stroke Father     Social History   Tobacco Use   Smoking status: Former    Current packs/day: 0.00    Types: Cigarettes    Quit date: 07/26/2007    Years since quitting: 16.6   Smokeless tobacco: Never  Vaping Use   Vaping status: Every Day  Substance Use Topics   Alcohol use: Yes    Comment: occasionally   Drug  use: No    ROS Refer to HPI for ROS details.  Objective:   Vitals: BP 101/67 (BP Location: Left Arm)   Pulse 71   Temp 98.1 F (36.7 C) (Oral)   Resp 18   Ht 5' (1.524 m)   Wt 145 lb 11.6 oz (66.1 kg)   LMP 02/24/2024 (Approximate)   SpO2 99%   BMI 28.46 kg/m   Physical Exam Vitals and nursing note reviewed.  Constitutional:      General: She is not in acute distress.    Appearance: She is well-developed. She is not ill-appearing or toxic-appearing.  HENT:     Head: Normocephalic and atraumatic.     Nose: Congestion present. No rhinorrhea.     Mouth/Throat:     Mouth: Mucous membranes are moist.     Pharynx: Oropharynx is clear. No oropharyngeal exudate or posterior oropharyngeal erythema.  Eyes:     General:        Right eye: No discharge.        Left eye: No discharge.     Extraocular Movements: Extraocular movements intact.     Conjunctiva/sclera: Conjunctivae normal.  Cardiovascular:     Rate and Rhythm: Normal rate and regular rhythm.     Heart sounds: Normal heart sounds. No murmur heard. Pulmonary:     Effort: Pulmonary effort is normal. No respiratory distress.     Breath sounds: Normal breath sounds. No stridor. No wheezing, rhonchi or rales.  Chest:     Chest wall: No tenderness.  Skin:    General: Skin is warm and dry.  Neurological:     General: No focal deficit present.     Mental Status: She is alert and oriented to person, place, and time.  Psychiatric:        Mood and Affect: Mood normal.        Behavior: Behavior normal.     Procedures  Results for orders placed or performed during the hospital encounter of 03/07/24 (from the past 24 hours)  POC Covid19/Flu A&B Antigen     Status: Abnormal   Collection Time: 03/07/24  2:48 PM  Result Value Ref Range   Influenza A Antigen, POC Negative Negative   Influenza B Antigen, POC Negative Negative   Covid Antigen, POC Positive (A) Negative    No results found.   Assessment and Plan :      Discharge Instructions       1. COVID-19 (Primary) - POC Covid19/Flu A&B Antigen completed in UC is negative for influenza, positive for COVID-19. - azelastine  (ASTELIN ) 0.1 % nasal spray; Place 1 spray into both nostrils 2 (two) times daily. Use in each nostril as directed  Dispense: 30 mL; Refill: 1 - promethazine -dextromethorphan (PROMETHAZINE -DM) 6.25-15 MG/5ML syrup; Take 5 mLs by mouth 4 (four) times daily as  needed.  Dispense: 240 mL; Refill: 0 - ibuprofen  (ADVIL ) 800 MG tablet; Take 1 tablet (800 mg total) by mouth every 8 (eight) hours as needed for fever or headache (pain).  Dispense: 21 tablet; Refill: 0 -Continue to monitor symptoms for any change in severity if there is any escalation of current symptoms or development of new symptoms follow-up in ER for further evaluation and management.       Jacolyn Joaquin B Jarnell Cordaro   Ben Habermann, [place], [place] 03/07/24 [phone]

## 2024-03-07 NOTE — Discharge Instructions (Addendum)
  1. COVID-19 (Primary) - POC Covid19/Flu A&B Antigen completed in UC is negative for influenza, positive for COVID-19. - azelastine  (ASTELIN ) 0.1 % nasal spray; Place 1 spray into both nostrils 2 (two) times daily. Use in each nostril as directed  Dispense: 30 mL; Refill: 1 - promethazine -dextromethorphan (PROMETHAZINE -DM) 6.25-15 MG/5ML syrup; Take 5 mLs by mouth 4 (four) times daily as needed.  Dispense: 240 mL; Refill: 0 - ibuprofen  (ADVIL ) 800 MG tablet; Take 1 tablet (800 mg total) by mouth every 8 (eight) hours as needed for fever or headache (pain).  Dispense: 21 tablet; Refill: 0 -Continue to monitor symptoms for any change in severity if there is any escalation of current symptoms or development of new symptoms follow-up in ER for further evaluation and management.

## 2024-07-16 ENCOUNTER — Telehealth: Admitting: Physician Assistant

## 2024-07-16 DIAGNOSIS — J069 Acute upper respiratory infection, unspecified: Secondary | ICD-10-CM

## 2024-07-16 DIAGNOSIS — Z760 Encounter for issue of repeat prescription: Secondary | ICD-10-CM

## 2024-07-16 DIAGNOSIS — E119 Type 2 diabetes mellitus without complications: Secondary | ICD-10-CM

## 2024-07-16 MED ORDER — METFORMIN HCL 500 MG PO TABS: 500.0000 mg | ORAL_TABLET | Freq: Two times a day (BID) | ORAL | 0 refills | Status: AC

## 2024-07-16 NOTE — Progress Notes (Signed)
 " Virtual Visit Consent   Wanda Harris, you are scheduled for a virtual visit with a [facility] provider today. Just as with appointments in the office, your consent must be obtained to participate. Your consent will be active for this visit and any virtual visit you may have with one of our providers in the next 365 days. If you have a MyChart account, a copy of this consent can be sent to you electronically.  As this is a virtual visit, video technology does not allow for your provider to perform a traditional examination. This may limit your provider's ability to fully assess your condition. If your provider identifies any concerns that need to be evaluated in person or the need to arrange testing (such as labs, EKG, etc.), we will make arrangements to do so. Although advances in technology are sophisticated, we cannot ensure that it will always work on either your end or our end. If the connection with a video visit is poor, the visit may have to be switched to a telephone visit. With either a video or telephone visit, we are not always able to ensure that we have a secure connection.  By engaging in this virtual visit, you consent to the provision of healthcare and authorize for your insurance to be billed (if applicable) for the services provided during this visit. Depending on your insurance coverage, you may receive a charge related to this service.  I need to obtain your verbal consent now. Are you willing to proceed with your visit today? Wanda Harris has provided verbal consent on 07/16/2024 for a virtual visit (video or telephone). Hartman Minahan, [place]  Date: 07/16/2024 11:58 AM   Virtual Visit via Video Note   I, Edward Trevino, connected with  Wanda Harris  ([id number], 19-Aug-1980) on 07/16/24 at 10:30 AM EST by a video-enabled telemedicine application and verified that I am speaking with the correct person using two identifiers.  Location: Patient: Virtual Visit  Location Patient: Home Provider: Virtual Visit Location Provider: Home Office   I discussed the limitations of evaluation and management by telemedicine and the availability of in person appointments. The patient expressed understanding and agreed to proceed.    History of Present Illness: Wanda Harris is a 44 y.o. who identifies as a female who was assigned female at birth, and is being seen today for fever.  HPI: Patient presents for evaluation of fever since yesterday.  This morning she treated her fever with Tylenol  which was somewhat helpful, at this time her temperature is 98.  Patient also reports body aches, fatigue and swelling of feet.  Of note reports there was sick contacts around her last week, no known exposure to COVID or flu.  She does not have any at home testing.  She denies any chest pain, shortness of breath.  Of note patient is also requesting a refill for her metformin , reports that she was recently was able to have Medicaid insurance and is trying to establish PCP care.  Patient states that she has not taken her metformin  for about 2 months.  Upon chart review recent medications noted.  No recent labs for review on chart review.  At this time it was discussed with patient that we will start her on her low-dose of metformin  while she is establishing care.  Patient understand that she needs to establish PCP care for further refills and labs.      Problems:  Patient Active Problem List   Diagnosis Date  Noted   Hemoglobin low 07/23/2017   Migraine 03/19/2017   Tobacco user 03/13/2017   Asthma 03/06/2017   Diabetes mellitus (HCC) 03/06/2017   Dyslipidemia 03/06/2017   Tension-type headache 03/06/2017   Postpartum care following cesarean delivery 01/12/2016   Vaginal burning 01/12/2016   Constipation 01/12/2016   Gestational diabetes mellitus (GDM), postpartum 01/12/2016   S/P repeat low transverse C-section 11/28/2015   Fetal macrosomia, delivered, current  hospitalization 11/28/2015   Pyelonephritis affecting pregnancy in third trimester 10/21/2015   Trichomonal vaginitis in pregnancy 08/06/2015   Supervision of young multigravida, antepartum 08/04/2015   GBS (group B streptococcus) UTI complicating pregnancy 08/04/2015   Gestational diabetes mellitus (GDM) affecting pregnancy 08/04/2015   Uterine scar from previous surgery affecting pregnancy 08/04/2015    Allergies: Allergies[1] Medications: Current Medications[2]  Observations/Objective: Patient is well-developed, well-nourished in no acute distress.  Ambulatory within the house without any dyspnea, no cough or congestion noted over video Resting comfortably  at home.  Head is normocephalic, atraumatic.  No labored breathing.  Speech is clear and coherent with logical content.  Patient is alert and oriented at baseline.    Assessment and Plan: 1. Acute URI (Primary)  2. Diabetes mellitus without complication (HCC) - metFORMIN  (GLUCOPHAGE ) 500 MG tablet; Take 1 tablet (500 mg total) by mouth 2 (two) times daily with a meal.  Dispense: 60 tablet; Refill: 0  3. Medication refill - metFORMIN  (GLUCOPHAGE ) 500 MG tablet; Take 1 tablet (500 mg total) by mouth 2 (two) times daily with a meal.  Dispense: 60 tablet; Refill: 0  I did advise patient to complete an at home COVID/FLU test. Message back with results and treatment plan will be adjusted if indicated Results: PENDING  Please seek an in-person evaluation if the symptoms worsen or if the condition fails to improve as anticipated.  Diabetes: One-time courtesy refill provided today, risks of taking her metformin  versus not discussed with patient.  At this time she reports she is out of her Lantus and has not been monitoring her blood glucose levels.  Unsure of what her last A1c was.  Patient is to establish care with PCP for further management of her condition.     Follow Up Instructions: I discussed the assessment and treatment  plan with the patient. The patient was provided an opportunity to ask questions and all were answered. The patient agreed with the plan and demonstrated an understanding of the instructions.  A copy of instructions were sent to the patient via MyChart unless otherwise noted below.     The patient was advised to call back or seek an in-person evaluation if the symptoms worsen or if the condition fails to improve as anticipated.    Coralie Stanke, PA-C     [1] No Known Allergies [2]  Current Outpatient Medications:    metFORMIN  (GLUCOPHAGE ) 500 MG tablet, Take 1 tablet (500 mg total) by mouth 2 (two) times daily with a meal., Disp: 60 tablet, Rfl: 0   azelastine  (ASTELIN ) 0.1 % nasal spray, Place 1 spray into both nostrils 2 (two) times daily. Use in each nostril as directed, Disp: 30 mL, Rfl: 1   cephALEXin  (KEFLEX ) 500 MG capsule, Take 500 mg by mouth 4 (four) times daily., Disp: , Rfl:    ciprofloxacin (CIPRO) 500 MG tablet, Take 500 mg by mouth 2 (two) times daily., Disp: , Rfl:    fluconazole  (DIFLUCAN ) 150 MG tablet, Take 1 tablet (150 mg total) by mouth every 3 (three) days. For three doses,  Disp: 5 tablet, Rfl: 0   ibuprofen  (ADVIL ) 800 MG tablet, Take 1 tablet (800 mg total) by mouth every 8 (eight) hours as needed for fever or headache (pain)., Disp: 21 tablet, Rfl: 0   lovastatin (MEVACOR) 10 MG tablet, Take 10 mg by mouth at bedtime., Disp: , Rfl:    ondansetron  (ZOFRAN -ODT) 4 MG disintegrating tablet, Take 4 mg by mouth every 8 (eight) hours as needed., Disp: , Rfl:    promethazine -dextromethorphan (PROMETHAZINE -DM) 6.25-15 MG/5ML syrup, Take 5 mLs by mouth 4 (four) times daily as needed., Disp: 240 mL, Rfl: 0   Pseudoephedrine-APAP-DM (TYLENOL  COLD/FLU DAY PO), Take by mouth., Disp: , Rfl:    SUMAtriptan (IMITREX) 25 MG tablet, Take 25 mg by mouth every 2 (two) hours as needed., Disp: , Rfl:    terconazole  (TERAZOL 3 ) 0.8 % vaginal cream, Place 1 applicator vaginally at bedtime.,  Disp: , Rfl:    triamcinolone  cream (KENALOG ) 0.1 %, Apply 1 Application topically 2 (two) times daily., Disp: , Rfl:    valACYclovir  (VALTREX ) 1000 MG tablet, At the start of an outbreak take 1 tablet daily for 5 days., Disp: 30 tablet, Rfl: 0  "

## 2024-07-16 NOTE — Patient Instructions (Signed)
 " Wanda Harris, thank you for joining Dao Memmott, PA-C for today's virtual visit.  While this provider is not your primary care provider (PCP), if your PCP is located in our provider database this encounter information will be shared with them immediately following your visit.   A [facility] MyChart account gives you access to today's visit and all your visits, tests, and labs performed at [facility]  click here if you don't have a [facility] MyChart account or go to [email or web address]  Consent: (Patient) Wanda Harris provided verbal consent for this virtual visit at the beginning of the encounter.  Current Medications:  Current Outpatient Medications:    metFORMIN  (GLUCOPHAGE ) 500 MG tablet, Take 1 tablet (500 mg total) by mouth 2 (two) times daily with a meal., Disp: 60 tablet, Rfl: 0   azelastine  (ASTELIN ) 0.1 % nasal spray, Place 1 spray into both nostrils 2 (two) times daily. Use in each nostril as directed, Disp: 30 mL, Rfl: 1   cephALEXin  (KEFLEX ) 500 MG capsule, Take 500 mg by mouth 4 (four) times daily., Disp: , Rfl:    ciprofloxacin (CIPRO) 500 MG tablet, Take 500 mg by mouth 2 (two) times daily., Disp: , Rfl:    fluconazole  (DIFLUCAN ) 150 MG tablet, Take 1 tablet (150 mg total) by mouth every 3 (three) days. For three doses, Disp: 5 tablet, Rfl: 0   ibuprofen  (ADVIL ) 800 MG tablet, Take 1 tablet (800 mg total) by mouth every 8 (eight) hours as needed for fever or headache (pain)., Disp: 21 tablet, Rfl: 0   lovastatin (MEVACOR) 10 MG tablet, Take 10 mg by mouth at bedtime., Disp: , Rfl:    ondansetron  (ZOFRAN -ODT) 4 MG disintegrating tablet, Take 4 mg by mouth every 8 (eight) hours as needed., Disp: , Rfl:    promethazine -dextromethorphan (PROMETHAZINE -DM) 6.25-15 MG/5ML syrup, Take 5 mLs by mouth 4 (four) times daily as needed., Disp: 240 mL, Rfl: 0   Pseudoephedrine-APAP-DM (TYLENOL  COLD/FLU DAY PO), Take by mouth., Disp: , Rfl:    SUMAtriptan  (IMITREX) 25 MG tablet, Take 25 mg by mouth every 2 (two) hours as needed., Disp: , Rfl:    terconazole  (TERAZOL 3 ) 0.8 % vaginal cream, Place 1 applicator vaginally at bedtime., Disp: , Rfl:    triamcinolone  cream (KENALOG ) 0.1 %, Apply 1 Application topically 2 (two) times daily., Disp: , Rfl:    valACYclovir  (VALTREX ) 1000 MG tablet, At the start of an outbreak take 1 tablet daily for 5 days., Disp: 30 tablet, Rfl: 0   Medications ordered in this encounter:  Meds ordered this encounter  Medications   metFORMIN  (GLUCOPHAGE ) 500 MG tablet    Sig: Take 1 tablet (500 mg total) by mouth 2 (two) times daily with a meal.    Dispense:  60 tablet    Refill:  0    Supervising Provider:   BLAISE ALEENE KIDD [[id number]]     *If you need refills on other medications prior to your next appointment, please contact your pharmacy*  Follow-Up: Call back or seek an in-person evaluation if the symptoms worsen or if the condition fails to improve as anticipated.  [facility] [phone]  Other Instructions I did advise patient to complete an at home COVID/FLU test. Message back with results and treatment plan will be adjusted if indicated Results: PENDING  Please seek an in-person evaluation if the symptoms worsen or if the condition fails to improve as anticipated.  PCP follow-up in 5-7 days  ONE  TIME courteous refill given for metformin , while patient establish care with new PCP   If you have been instructed to have an in-person evaluation today at a local Urgent Care facility, please use the link below. It will take you to a list of all of our available [facility], including address, phone number and hours of operation. Please do not delay care.  [facility]  If you or a family member do not have a primary care provider, use the link below to schedule a visit and establish care. When you choose a [facility] primary care physician or advanced practice  provider, you gain a long-term partner in health. Find a Primary Care Provider  Learn more about [facility]'s in-office and virtual care options: [facility] - Get Care Now  "

## 2024-07-22 ENCOUNTER — Ambulatory Visit: Admitting: Podiatry

## 2024-08-04 ENCOUNTER — Ambulatory Visit: Payer: Self-pay | Admitting: Family
